# Patient Record
Sex: Female | Born: 1960 | Race: White | Hispanic: No | Smoking: Former smoker
Health system: Southern US, Community
[De-identification: ages and names within clinical notes are randomized; demographics above are authoritative.]

## PROBLEM LIST (undated history)

## (undated) DIAGNOSIS — G4730 Sleep apnea, unspecified: Secondary | ICD-10-CM

## (undated) DIAGNOSIS — H539 Unspecified visual disturbance: Secondary | ICD-10-CM

## (undated) DIAGNOSIS — G8191 Hemiplegia, unspecified affecting right dominant side: Secondary | ICD-10-CM

## (undated) DIAGNOSIS — R9430 Abnormal result of cardiovascular function study, unspecified: Secondary | ICD-10-CM

## (undated) DIAGNOSIS — R413 Other amnesia: Secondary | ICD-10-CM

## (undated) DIAGNOSIS — I639 Cerebral infarction, unspecified: Secondary | ICD-10-CM

## (undated) DIAGNOSIS — R5383 Other fatigue: Secondary | ICD-10-CM

## (undated) DIAGNOSIS — T8859XA Other complications of anesthesia, initial encounter: Secondary | ICD-10-CM

## (undated) DIAGNOSIS — Q211 Atrial septal defect: Secondary | ICD-10-CM

## (undated) DIAGNOSIS — K219 Gastro-esophageal reflux disease without esophagitis: Secondary | ICD-10-CM

## (undated) DIAGNOSIS — I10 Essential (primary) hypertension: Secondary | ICD-10-CM

## (undated) DIAGNOSIS — K5792 Diverticulitis of intestine, part unspecified, without perforation or abscess without bleeding: Secondary | ICD-10-CM

## (undated) DIAGNOSIS — Z888 Allergy status to other drugs, medicaments and biological substances status: Secondary | ICD-10-CM

## (undated) DIAGNOSIS — N643 Galactorrhea not associated with childbirth: Secondary | ICD-10-CM

## (undated) DIAGNOSIS — IMO0002 Reserved for concepts with insufficient information to code with codable children: Secondary | ICD-10-CM

## (undated) DIAGNOSIS — R7303 Prediabetes: Secondary | ICD-10-CM

## (undated) DIAGNOSIS — Z720 Tobacco use: Secondary | ICD-10-CM

## (undated) DIAGNOSIS — Q2112 Patent foramen ovale: Secondary | ICD-10-CM

## (undated) DIAGNOSIS — E079 Disorder of thyroid, unspecified: Secondary | ICD-10-CM

## (undated) DIAGNOSIS — Z87442 Personal history of urinary calculi: Secondary | ICD-10-CM

## (undated) DIAGNOSIS — N209 Urinary calculus, unspecified: Secondary | ICD-10-CM

## (undated) DIAGNOSIS — N200 Calculus of kidney: Secondary | ICD-10-CM

## (undated) DIAGNOSIS — G43909 Migraine, unspecified, not intractable, without status migrainosus: Secondary | ICD-10-CM

## (undated) DIAGNOSIS — R42 Dizziness and giddiness: Secondary | ICD-10-CM

## (undated) DIAGNOSIS — E063 Autoimmune thyroiditis: Secondary | ICD-10-CM

## (undated) HISTORY — DX: Abnormal result of cardiovascular function study, unspecified: R94.30

## (undated) HISTORY — DX: Patent foramen ovale: Q21.12

## (undated) HISTORY — DX: Other fatigue: R53.83

## (undated) HISTORY — DX: Essential (primary) hypertension: I10

## (undated) HISTORY — PX: TONSILLECTOMY: SUR1361

## (undated) HISTORY — DX: Cerebral infarction, unspecified: I63.9

## (undated) HISTORY — DX: Migraine, unspecified, not intractable, without status migrainosus: G43.909

## (undated) HISTORY — DX: Gastro-esophageal reflux disease without esophagitis: K21.9

## (undated) HISTORY — DX: Tobacco use: Z72.0

## (undated) HISTORY — DX: Dizziness and giddiness: R42

## (undated) HISTORY — DX: Diverticulitis of intestine, part unspecified, without perforation or abscess without bleeding: K57.92

## (undated) HISTORY — DX: Hemiplegia, unspecified affecting right dominant side: G81.91

## (undated) HISTORY — DX: Urinary calculus, unspecified: N20.9

## (undated) HISTORY — PX: OTHER SURGICAL HISTORY: SHX169

## (undated) HISTORY — DX: Autoimmune thyroiditis: E06.3

## (undated) HISTORY — DX: Reserved for concepts with insufficient information to code with codable children: IMO0002

## (undated) HISTORY — DX: Galactorrhea not associated with childbirth: N64.3

## (undated) HISTORY — DX: Other amnesia: R41.3

## (undated) HISTORY — DX: Allergy status to other drugs, medicaments and biological substances: Z88.8

## (undated) HISTORY — DX: Atrial septal defect: Q21.1

## (undated) HISTORY — PX: TONSILLECTOMY AND ADENOIDECTOMY: SHX28

## (undated) HISTORY — PX: BACK SURGERY: SHX140

## (undated) HISTORY — DX: Disorder of thyroid, unspecified: E07.9

## (undated) HISTORY — PX: PATENT FORAMEN OVALE(PFO) CLOSURE: CATH118300

## (undated) HISTORY — DX: Unspecified visual disturbance: H53.9

---

## 1995-03-07 HISTORY — PX: TOTAL ABDOMINAL HYSTERECTOMY: SHX209

## 2004-08-10 ENCOUNTER — Ambulatory Visit: Payer: Self-pay | Admitting: "Endocrinology

## 2004-12-12 ENCOUNTER — Ambulatory Visit: Payer: Self-pay | Admitting: "Endocrinology

## 2005-05-09 ENCOUNTER — Ambulatory Visit: Payer: Self-pay | Admitting: "Endocrinology

## 2005-08-23 DIAGNOSIS — S20229A Contusion of unspecified back wall of thorax, initial encounter: Secondary | ICD-10-CM | POA: Insufficient documentation

## 2005-08-23 DIAGNOSIS — M1990 Unspecified osteoarthritis, unspecified site: Secondary | ICD-10-CM | POA: Insufficient documentation

## 2005-08-23 DIAGNOSIS — M51379 Other intervertebral disc degeneration, lumbosacral region without mention of lumbar back pain or lower extremity pain: Secondary | ICD-10-CM | POA: Insufficient documentation

## 2005-10-18 ENCOUNTER — Ambulatory Visit: Payer: Self-pay | Admitting: "Endocrinology

## 2005-12-06 ENCOUNTER — Ambulatory Visit (HOSPITAL_COMMUNITY): Admission: RE | Admit: 2005-12-06 | Discharge: 2005-12-07 | Payer: Self-pay | Admitting: Orthopaedic Surgery

## 2006-05-01 ENCOUNTER — Encounter: Admission: RE | Admit: 2006-05-01 | Discharge: 2006-05-01 | Payer: Self-pay | Admitting: "Endocrinology

## 2006-05-01 ENCOUNTER — Ambulatory Visit: Payer: Self-pay | Admitting: "Endocrinology

## 2006-05-10 ENCOUNTER — Ambulatory Visit: Payer: Self-pay | Admitting: "Endocrinology

## 2006-05-23 ENCOUNTER — Encounter: Admission: RE | Admit: 2006-05-23 | Discharge: 2006-05-23 | Payer: Self-pay | Admitting: "Endocrinology

## 2006-05-24 ENCOUNTER — Ambulatory Visit: Payer: Self-pay | Admitting: "Endocrinology

## 2006-05-25 ENCOUNTER — Encounter: Admission: RE | Admit: 2006-05-25 | Discharge: 2006-05-25 | Payer: Self-pay | Admitting: "Endocrinology

## 2006-05-25 HISTORY — PX: OTHER SURGICAL HISTORY: SHX169

## 2006-09-27 ENCOUNTER — Encounter: Admission: RE | Admit: 2006-09-27 | Discharge: 2006-09-27 | Payer: Self-pay | Admitting: "Endocrinology

## 2006-10-02 ENCOUNTER — Ambulatory Visit: Payer: Self-pay | Admitting: "Endocrinology

## 2007-02-04 ENCOUNTER — Ambulatory Visit: Payer: Self-pay | Admitting: "Endocrinology

## 2007-03-14 ENCOUNTER — Encounter: Admission: RE | Admit: 2007-03-14 | Discharge: 2007-03-14 | Payer: Self-pay | Admitting: Orthopaedic Surgery

## 2007-04-08 ENCOUNTER — Inpatient Hospital Stay (HOSPITAL_COMMUNITY): Admission: RE | Admit: 2007-04-08 | Discharge: 2007-04-12 | Payer: Self-pay | Admitting: Orthopaedic Surgery

## 2007-06-10 ENCOUNTER — Ambulatory Visit: Payer: Self-pay | Admitting: "Endocrinology

## 2007-09-24 ENCOUNTER — Encounter: Payer: Self-pay | Admitting: Endocrinology

## 2008-02-12 ENCOUNTER — Ambulatory Visit: Payer: Self-pay | Admitting: "Endocrinology

## 2008-02-19 ENCOUNTER — Encounter: Admission: RE | Admit: 2008-02-19 | Discharge: 2008-02-19 | Payer: Self-pay | Admitting: "Endocrinology

## 2008-02-19 ENCOUNTER — Encounter: Payer: Self-pay | Admitting: Endocrinology

## 2008-02-23 IMAGING — RF DG DISKOGRAPHY LUMBAR S+I
12 series · 12 of 12 positions shown · IV contrast (omnipaque)
Comparison: none

CLINICAL DATA: Back pain 
LUMBAR DISKOGRAM: 
The patient was given extensive informed consent including the risk of pain, infection, spinal fluid leak, and neurologic deficit. Specifically, the risk of infection including diskitis and osteomyelitis was discussed with the patient, who agreed to proceed. 
The patient was given 1 gram Ancef IV thirty minutes prior to the procedure. 1 cc of Ancef was added to 20 cc of Omnipaque 180 used for injection. The back was prepared with a sterile scrub sponge for five minutes followed by copious application of Betadine solution. Sterile drapes were applied and strict sterile technique was used by everyone in the room. A left paraspinous approach was taken using 22 gauge Igmary Maxi. Individual disk spaces were examined as follows:
TECHNIQUE: Multidetector CT imaging of the lumbar spine was performed after intradiskal injection of contrast. Multiplanar CT image reconstructions were also generated.

[Series 1: discogram · 1 of 1 slices shown (1 of 10)]
[im 1/1]
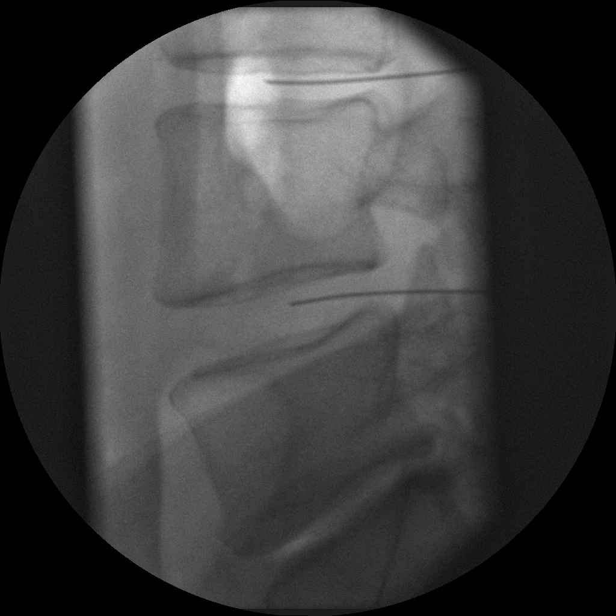

[Series 2: discogram · 1 of 1 slices shown (2 of 10)]
[im 1/1]
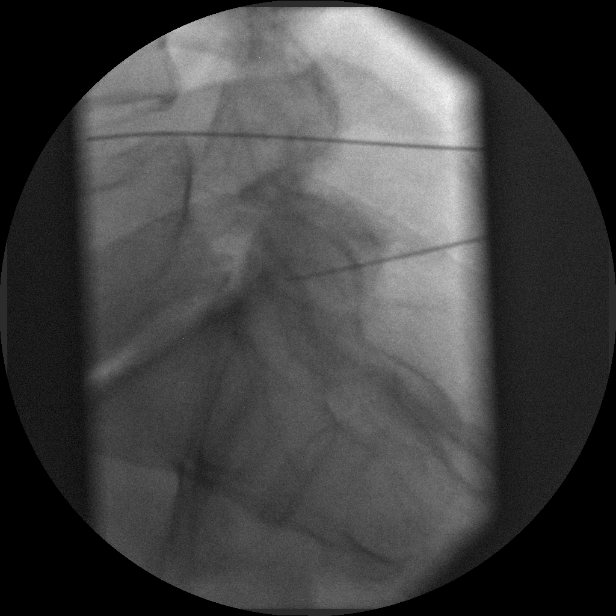

[Series 3: discogram · 1 of 1 slices shown (3 of 10)]
[im 1/1]
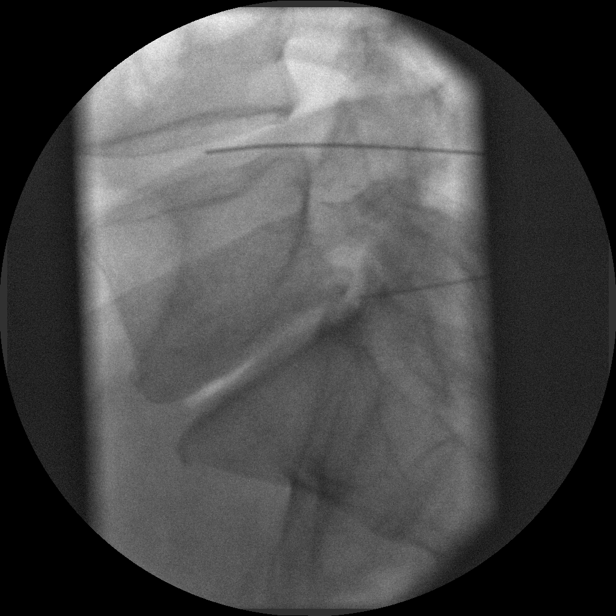

[Series 4: discogram · 1 of 1 slices shown (4 of 10)]
[im 1/1]
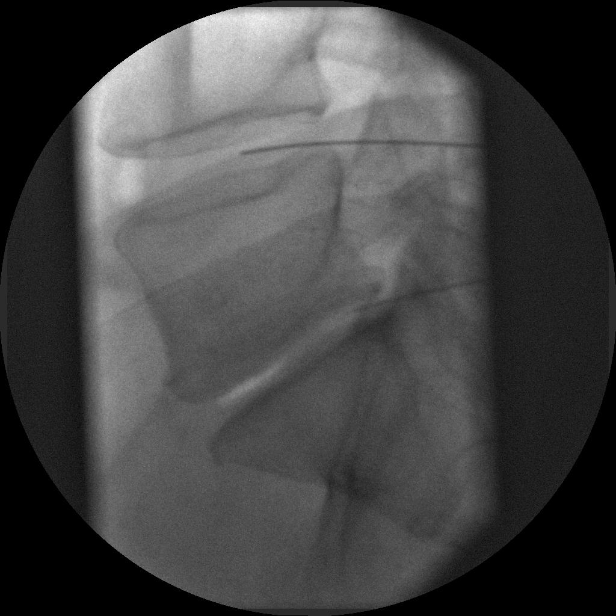

[Series 5: discogram · 1 of 1 slices shown (5 of 10)]
[im 1/1]
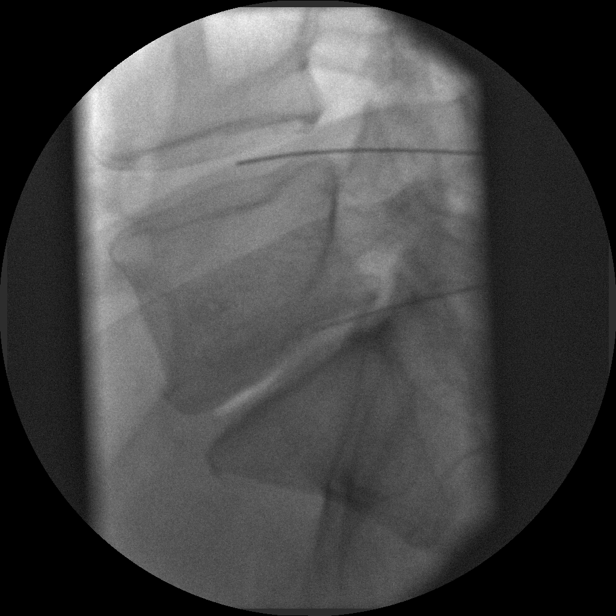

[Series 6: discogram · 1 of 1 slices shown (6 of 10)]
[im 1/1]
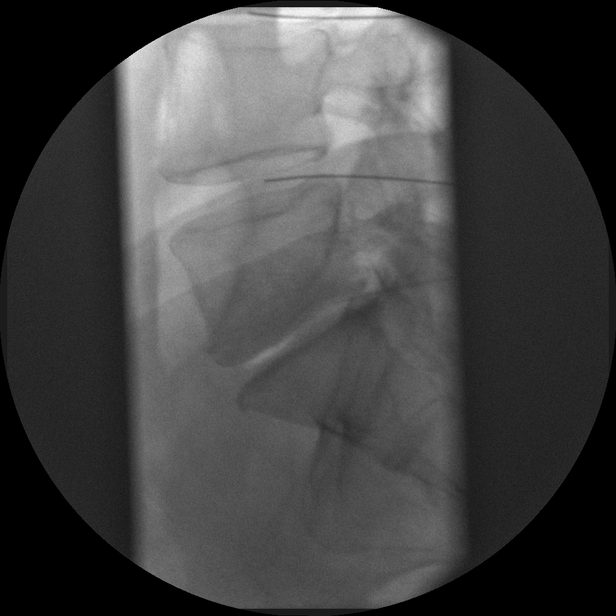

[Series 7: (hospital) · 1 of 1 slices shown (1 of 2)]
[im 1/1]
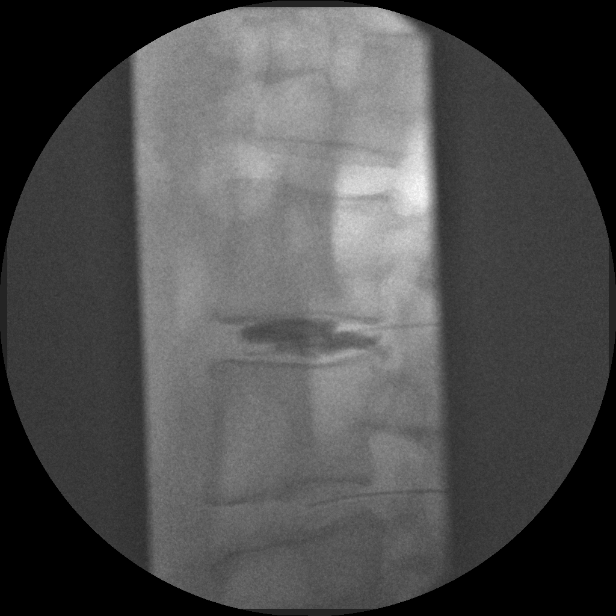

[Series 8: discogram · 1 of 1 slices shown (7 of 10)]
[im 1/1]
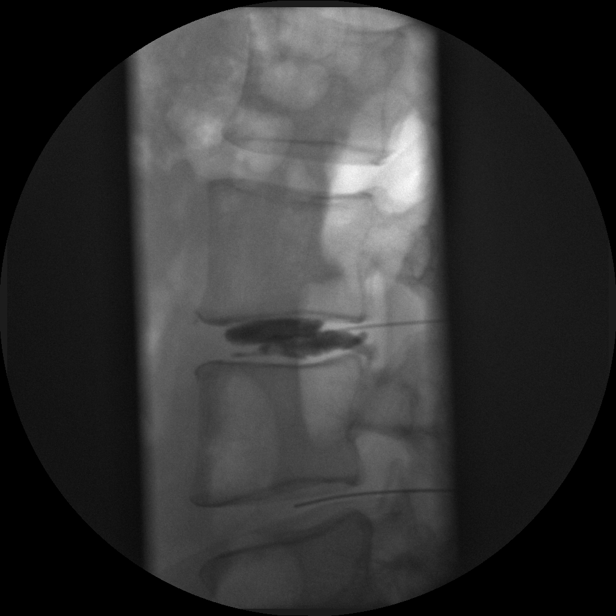

[Series 9: (hospital) · 1 of 1 slices shown (2 of 2)]
[im 1/1]
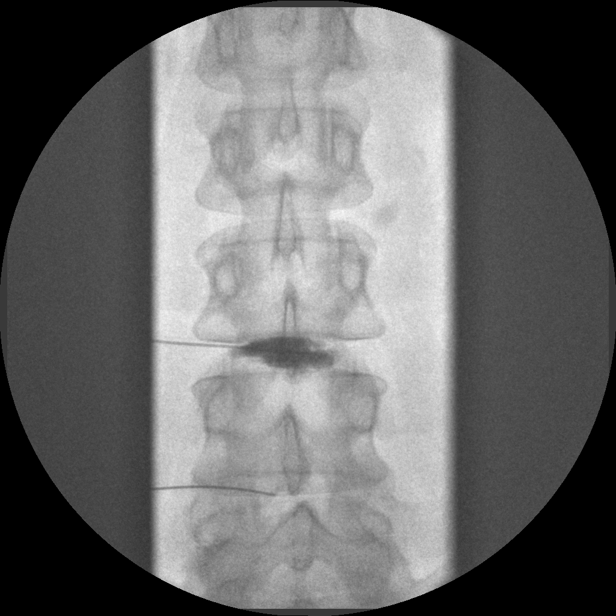

[Series 10: discogram · 1 of 1 slices shown (8 of 10)]
[im 1/1]
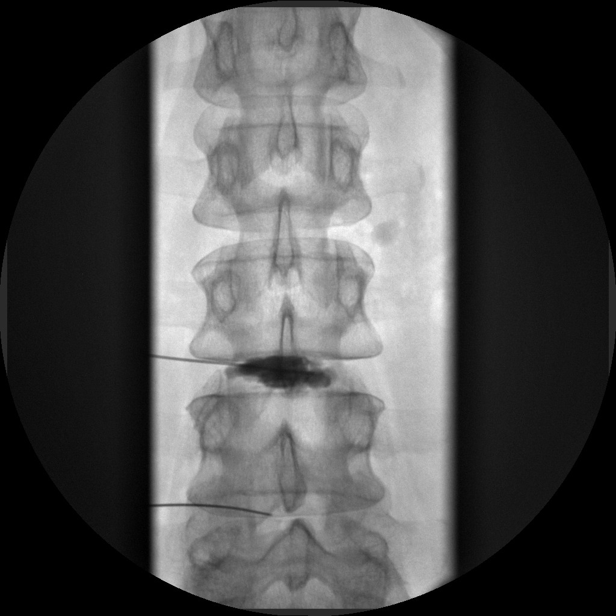

[Series 11: discogram · 1 of 1 slices shown (9 of 10)]
[im 1/1]
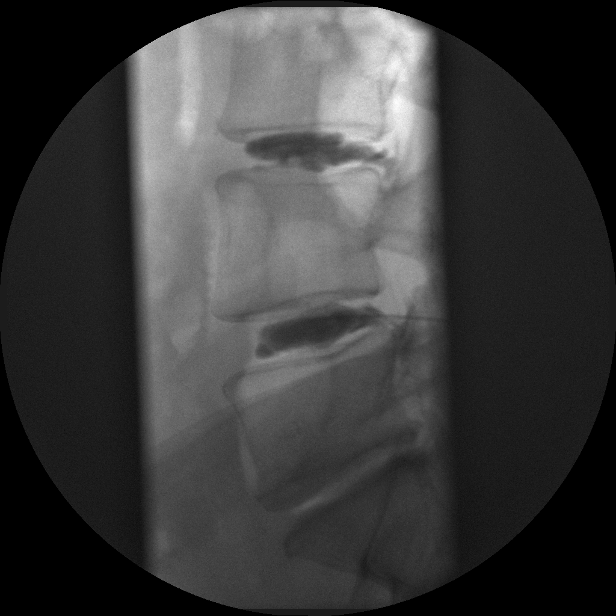

[Series 12: discogram · 1 of 1 slices shown (10 of 10)]
[im 1/1]
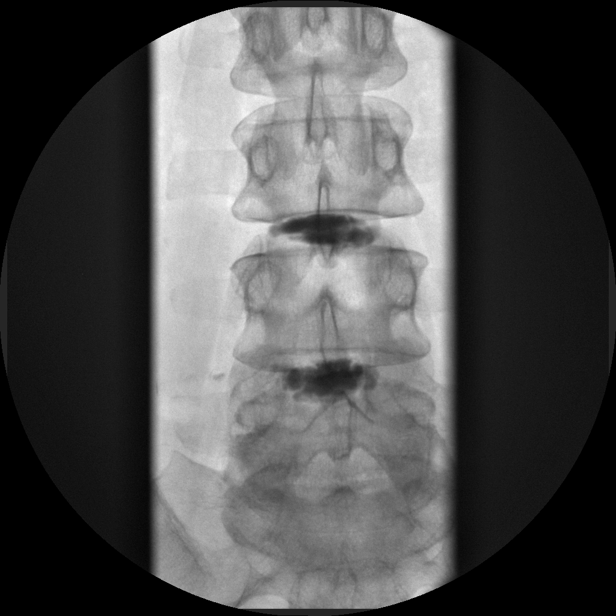

[12 of 12 positions shown; findings below may reference images not displayed]

FINDINGS: L3-4: Opening pressure was 5 PSI with no concordant pain at this level. A pressure endpoint of 20 PSI was reached, after 3 cc of contrast was instilled. 
L4-5: Opening pressure was 5 PSI. Pressure and pain was elicited at a pressure of 25 PSI with a total volume of 2.5 ml infused. The pain was concordant, with pressure and pain right greater than left in the leg and buttock area. .  Pain at this level radiated to the buttocks and lower back.
L5-S1: This disk space was approached through a left paramedian transdural approach due to severe degeneration of the L5-S1 disk. Opening pressure was 50 PSI, and there was no disk space to speak of . The needle placement elicited an exact match to the patient?s low back pain. Infusion of 1.5 ml of contrast freely flowed out of the disk into the epidural space posteriorly, consistent with diffuse annular tearing and completely incompetent posterior annular fibers.
IMPRESSION: 1. Concordant pain at L5-S1 disk with concentric annular tearing, with posterior epidural extravasation of contrast. 
2. Concordant pain at L4-5 disk with concentric annular tearing, with posterior epidural extravasation of contrast..
3. No concordant pain at L3-L4, with posterior annular tear.
POST DISKOGRAM CT SCAN OF THE LUMBAR SPINE:
FINDINGS: L3-4: Right posterior paracentral annular rent with epidural extravasation of contrast. No significant bony foraminal stenosis. There appears to have been a right minilaminotomy at this level. 
L4-5: Large posterior annular rent, with right hemilaminotomy. Free spill of contrast through this right paracentral annular tear into the epidural space. No significant bony foraminal stenosis at L4-5. 
L5-S1: Diffusely degenerated disk, with vacuum disk phenomena, diffuse annular tearing, and posterior epidural extravasation. Large posteroinferior endplate osteophytes contribute to biforaminal narrowing. The facets are moderately degenerated with moderate spurring, also contributing to the foraminal stenosis.
IMPRESSION: 1. Right paracentral posterior annular tears at L3-4 and L4-5.
2. Diffuse concentric annular tearing and severe disk degeneration, with extravasation of contrast posteriorly.
3. Biforaminal narrowing at L5-S1 due to a combination of disk, osteophytes and facet spurring.

## 2008-03-19 IMAGING — RF DG LUMBAR SPINE 2-3V
1 series · 4 of 4 positions shown · non-contrast
Comparison: CT of the lumbar spine of 03/14/07.

CLINICAL DATA: Lumbar disc disease and posterior fusion.
 LUMBAR SPINE ? 2 VIEW:

[Series 1: run · 4 of 4 slices shown]
[im 1/4]
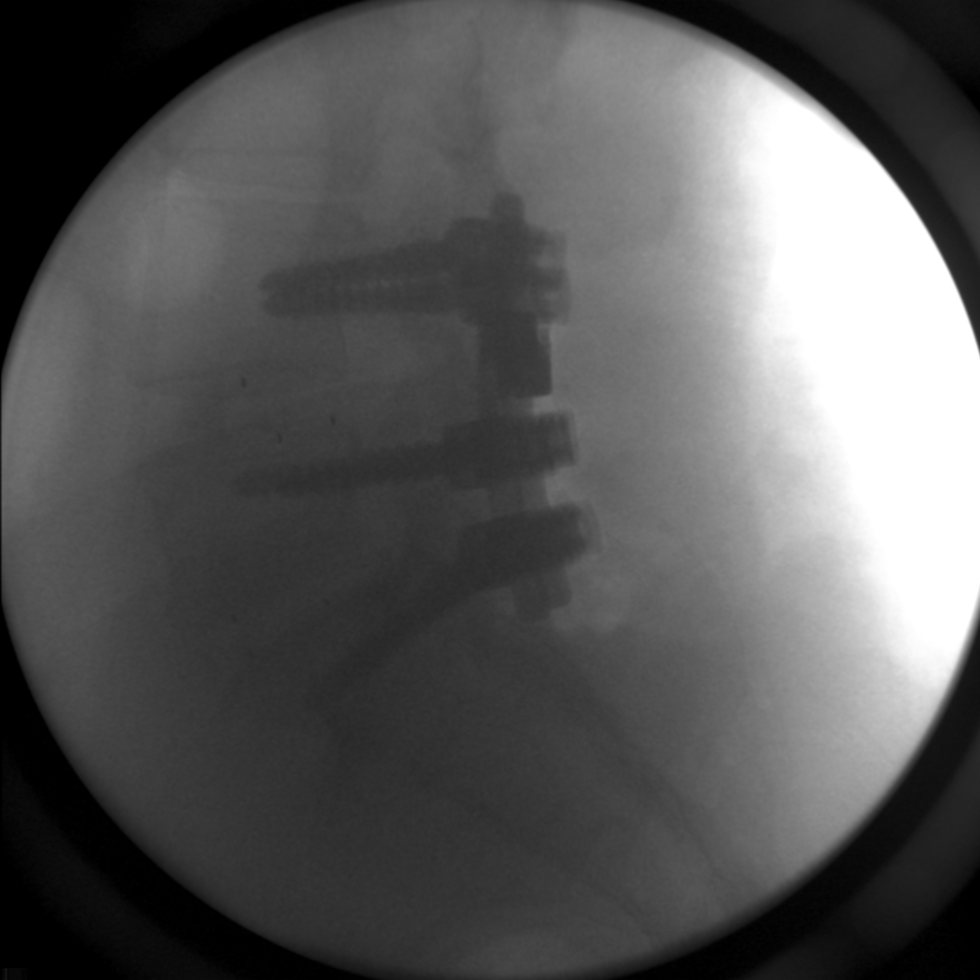
[im 2/4]
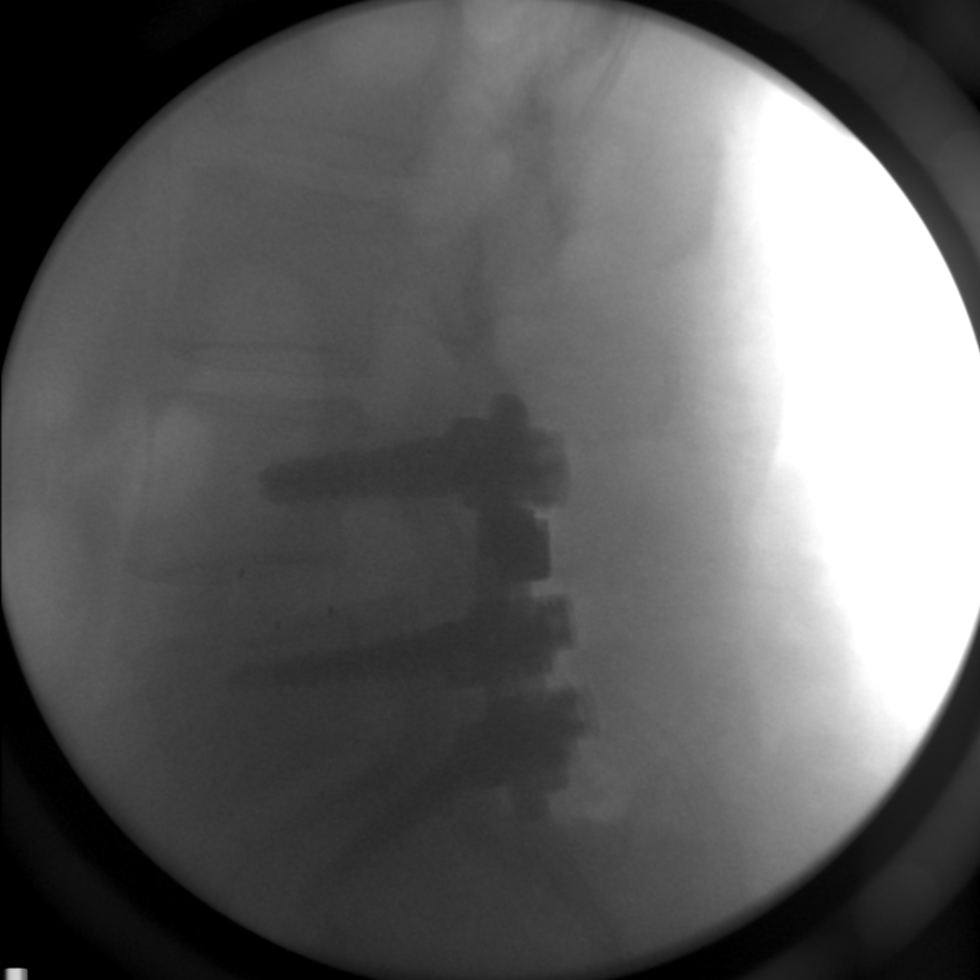
[im 3/4]
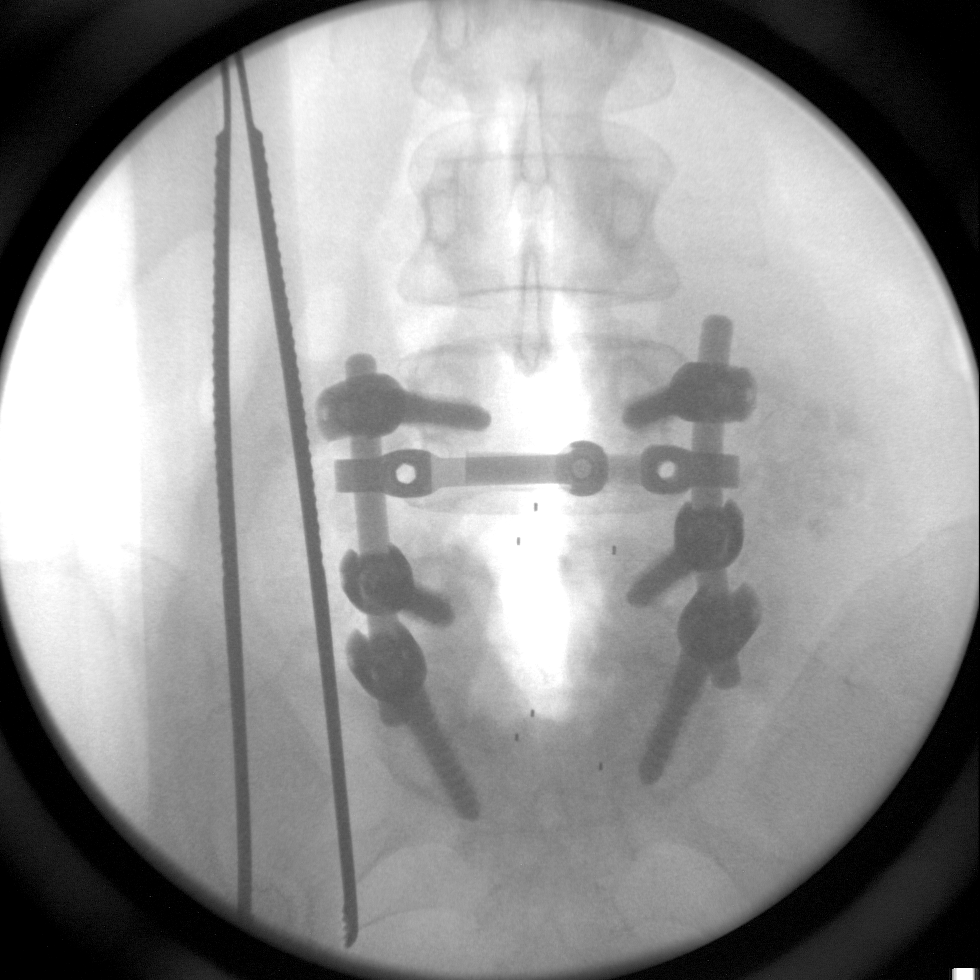
[im 4/4]
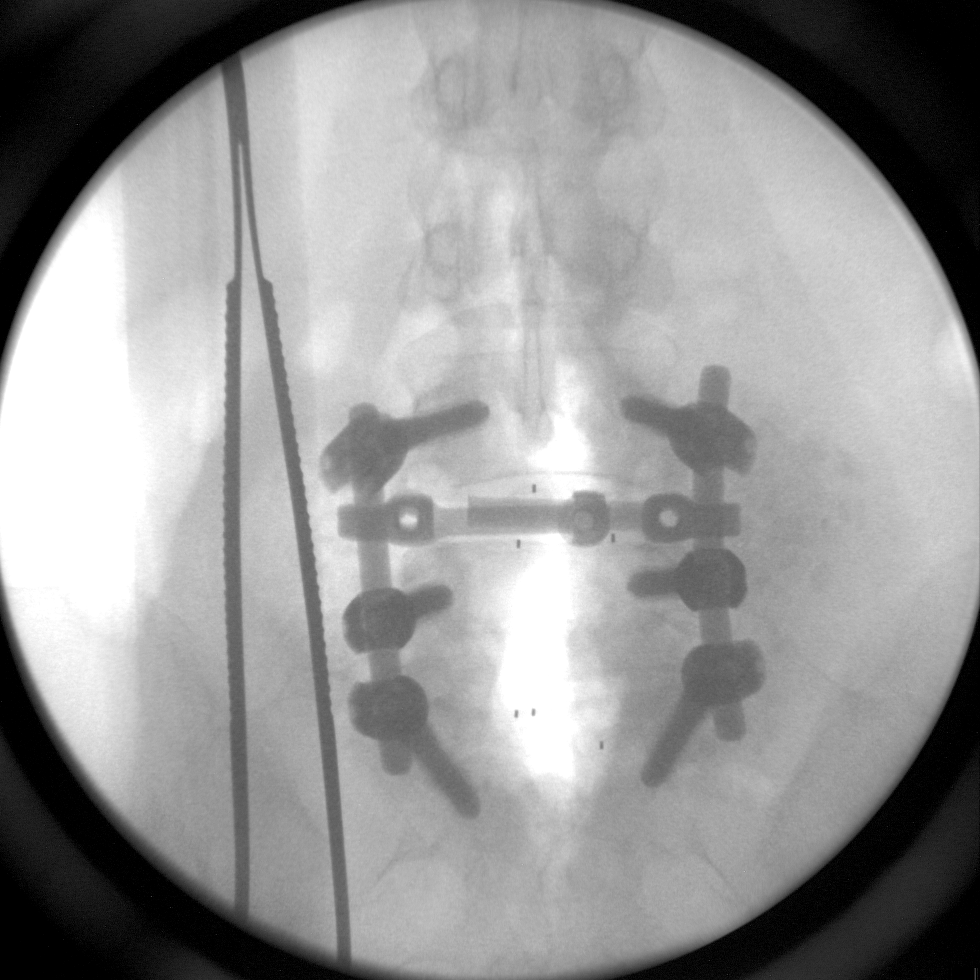

[4 of 4 positions shown; findings below may reference images not displayed]

FINDINGS: Frontal and lateral projections were obtained intraoperatively.  This showed the patient to be status post posterior lumbar fusion extending from L4 to S1 with appropriate positioning and appearance of hardware including transpedicular screws at all three levels and intervening Allograft at the L4-5 and L5-S1 disc space levels.  Laminectomy defects are also visualized.  Alignment appears near anatomic.
IMPRESSION: Appropriate appearance and alignment status post posterior lumbar fusion and decompressive surgery spanning from L4 to S1.

## 2008-04-15 ENCOUNTER — Encounter: Admission: RE | Admit: 2008-04-15 | Discharge: 2008-04-15 | Payer: Self-pay | Admitting: General Practice

## 2008-04-17 ENCOUNTER — Encounter: Admission: RE | Admit: 2008-04-17 | Discharge: 2008-04-17 | Payer: Self-pay | Admitting: General Practice

## 2008-04-23 ENCOUNTER — Ambulatory Visit: Payer: Self-pay | Admitting: Endocrinology

## 2008-04-24 ENCOUNTER — Telehealth (INDEPENDENT_AMBULATORY_CARE_PROVIDER_SITE_OTHER): Payer: Self-pay | Admitting: *Deleted

## 2008-05-13 ENCOUNTER — Ambulatory Visit: Payer: Self-pay

## 2008-05-13 ENCOUNTER — Encounter (INDEPENDENT_AMBULATORY_CARE_PROVIDER_SITE_OTHER): Payer: Self-pay | Admitting: Neurology

## 2008-06-15 ENCOUNTER — Encounter: Payer: Self-pay | Admitting: Cardiology

## 2008-06-18 ENCOUNTER — Ambulatory Visit: Payer: Self-pay | Admitting: Cardiology

## 2008-06-19 ENCOUNTER — Encounter: Payer: Self-pay | Admitting: Cardiology

## 2008-07-01 ENCOUNTER — Encounter: Payer: Self-pay | Admitting: Cardiology

## 2008-07-03 ENCOUNTER — Ambulatory Visit: Payer: Self-pay | Admitting: "Endocrinology

## 2008-07-06 ENCOUNTER — Ambulatory Visit (HOSPITAL_COMMUNITY): Admission: RE | Admit: 2008-07-06 | Discharge: 2008-07-06 | Payer: Self-pay | Admitting: Neurology

## 2008-07-06 ENCOUNTER — Ambulatory Visit: Payer: Self-pay | Admitting: "Endocrinology

## 2008-07-06 ENCOUNTER — Encounter: Payer: Self-pay | Admitting: Cardiology

## 2008-07-22 ENCOUNTER — Telehealth (INDEPENDENT_AMBULATORY_CARE_PROVIDER_SITE_OTHER): Payer: Self-pay | Admitting: *Deleted

## 2008-08-07 ENCOUNTER — Ambulatory Visit: Payer: Self-pay | Admitting: Cardiology

## 2008-08-07 ENCOUNTER — Encounter: Payer: Self-pay | Admitting: Cardiology

## 2008-08-25 ENCOUNTER — Telehealth: Payer: Self-pay | Admitting: Cardiology

## 2008-10-06 ENCOUNTER — Encounter: Payer: Self-pay | Admitting: Cardiology

## 2008-10-22 ENCOUNTER — Ambulatory Visit: Payer: Self-pay | Admitting: "Endocrinology

## 2008-10-29 ENCOUNTER — Ambulatory Visit: Payer: Self-pay | Admitting: Cardiology

## 2008-10-30 ENCOUNTER — Encounter: Admission: RE | Admit: 2008-10-30 | Discharge: 2008-10-30 | Payer: Self-pay | Admitting: Orthopaedic Surgery

## 2008-11-05 ENCOUNTER — Encounter: Payer: Self-pay | Admitting: Cardiology

## 2008-11-26 ENCOUNTER — Encounter: Payer: Self-pay | Admitting: Cardiology

## 2008-11-27 ENCOUNTER — Encounter: Payer: Self-pay | Admitting: Cardiology

## 2008-11-28 ENCOUNTER — Encounter: Payer: Self-pay | Admitting: Cardiology

## 2009-01-30 IMAGING — US US SOFT TISSUE HEAD/NECK
1 series · 13 of 13 positions shown · non-contrast
Comparison: 09/27/2006

CLINICAL DATA: Follow up multinodular thyroid gland/Graves' disease

THYROID ULTRASOUND
TECHNIQUE: Ultrasound examination of the thyroid gland and
adjacent soft tissues was performed.

[Series 1: us soft tissue head/neck · 0.06mm/px · 13 of 13 slices shown]
[im 1/13]
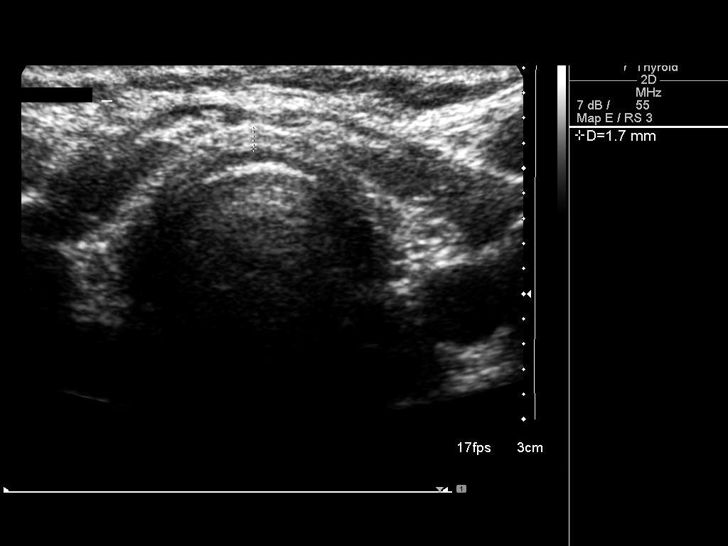
[im 2/13]
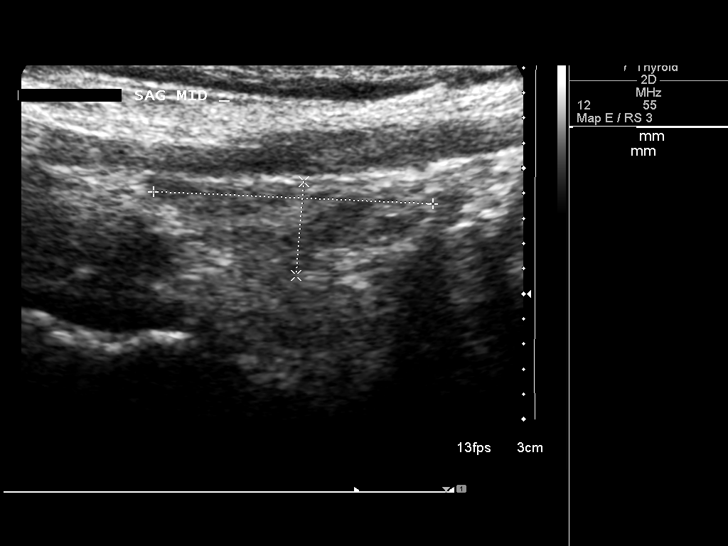
[im 3/13]
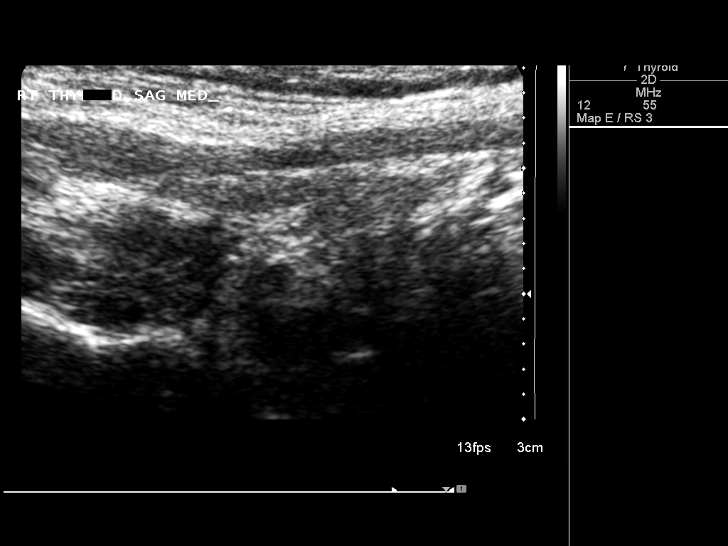
[im 4/13]
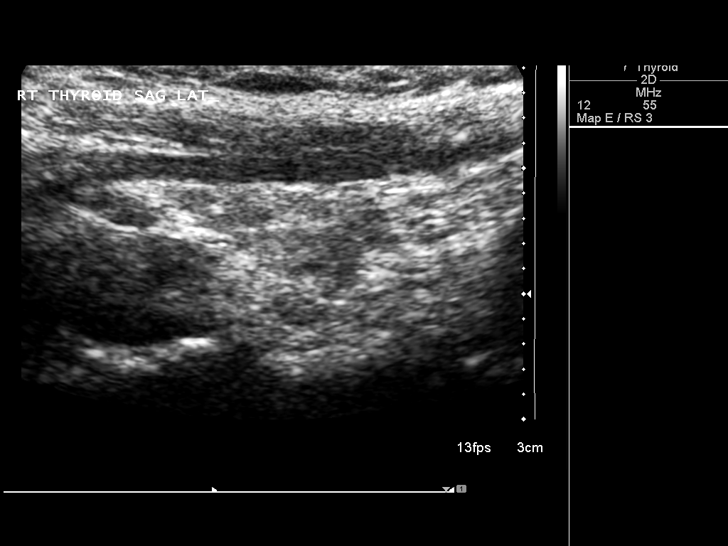
[im 5/13]
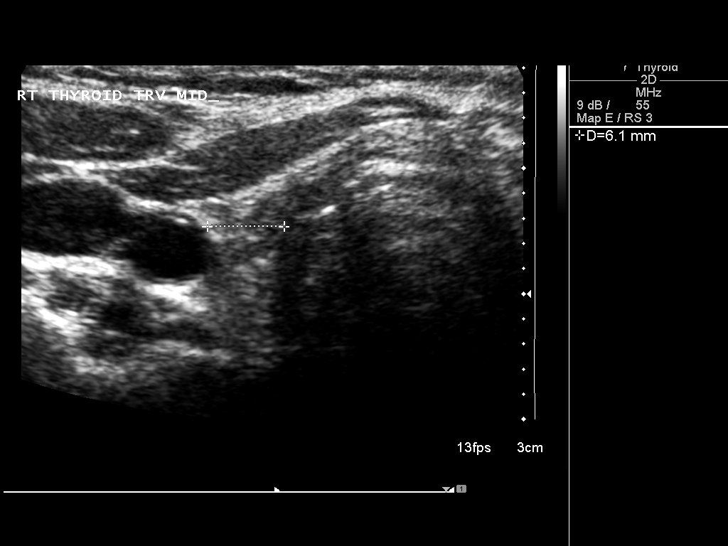
[im 6/13]
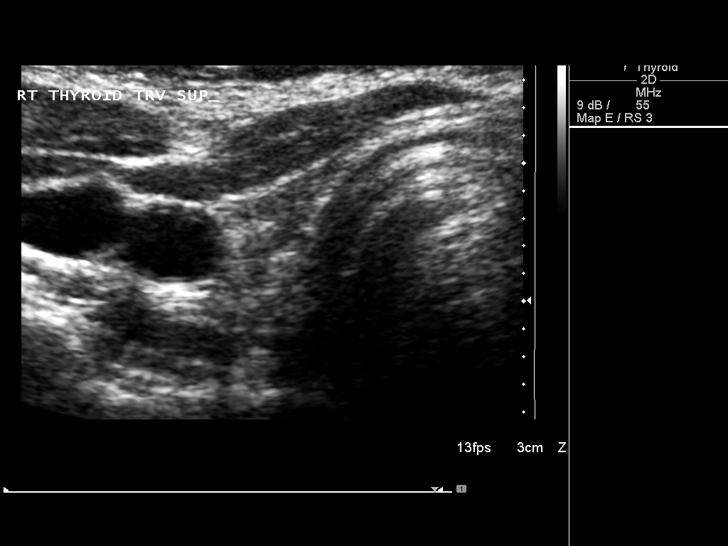
[im 7/13]
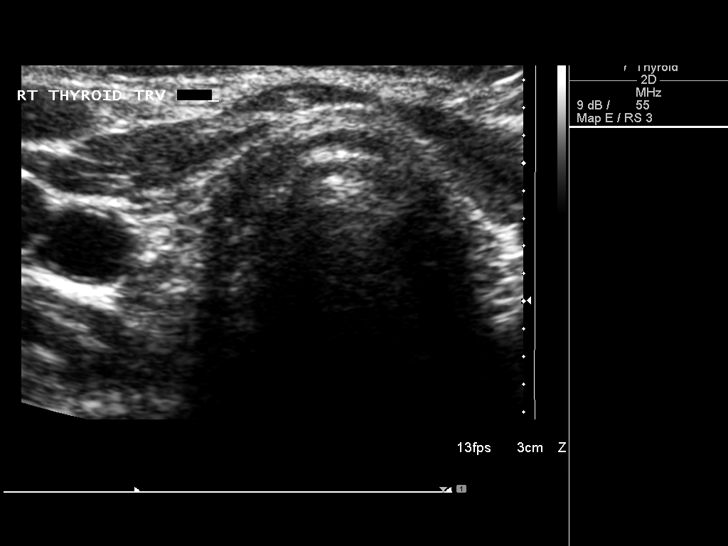
[im 8/13]
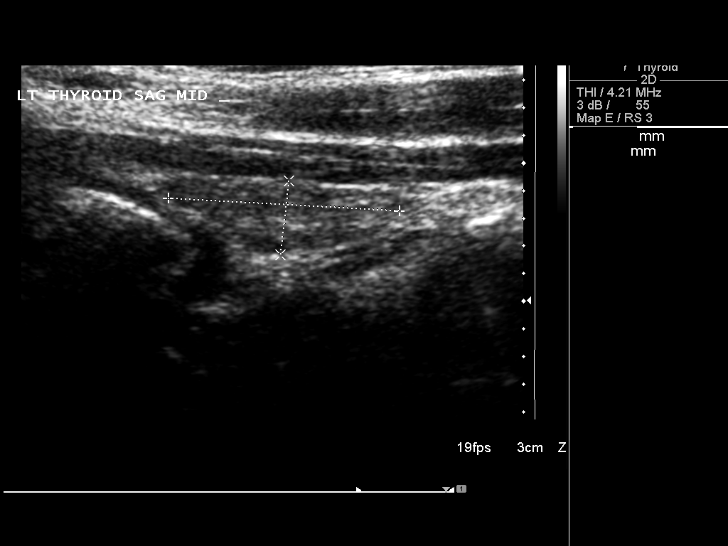
[im 9/13]
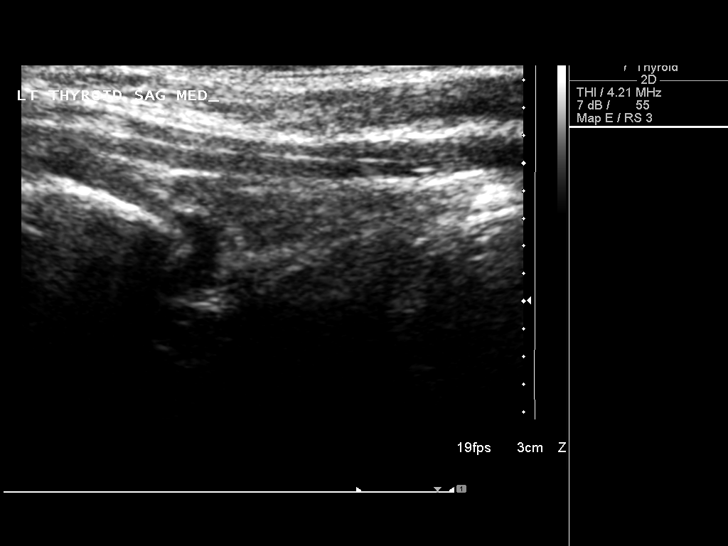
[im 10/13]
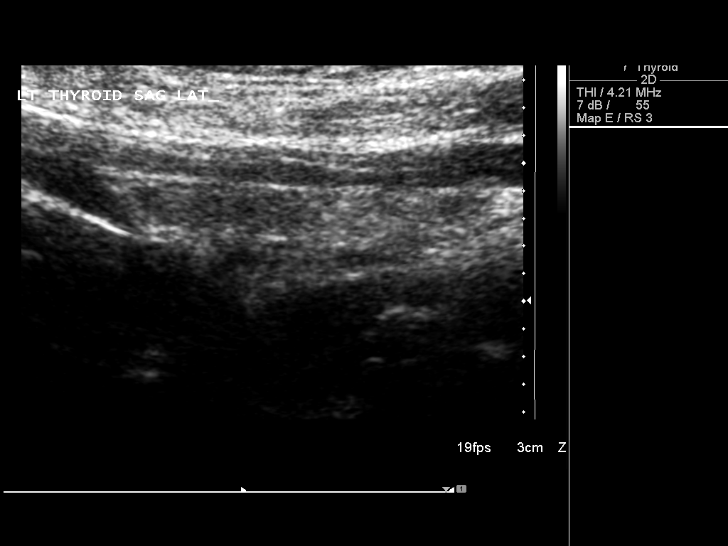
[im 11/13]
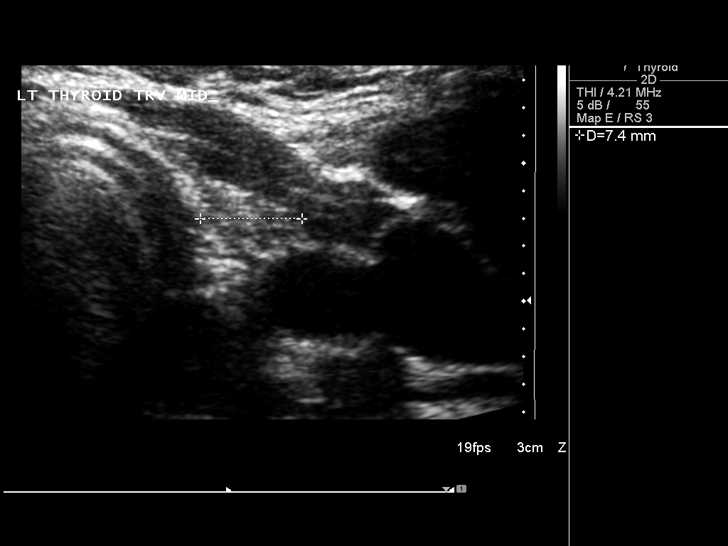
[im 12/13]
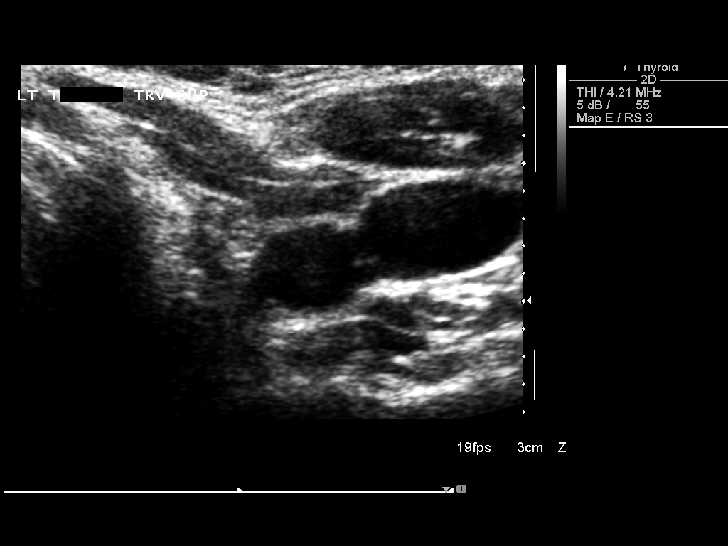
[im 13/13]
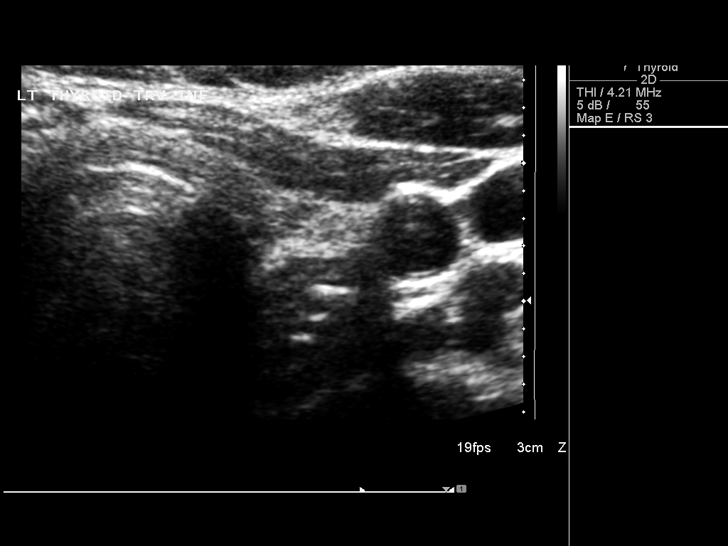

[13 of 13 positions shown; findings below may reference images not displayed]

FINDINGS: The right lobe of the thyroid gland measures 2.2 x 0.7 x
0.6 cm. Previously this measured 3.3 x 1.2 x 0.9 cm.

The left lobe measures 1.7 x 0.5 x 0.7 cm. Previously this measured
3.3 x 0.9 x 0.9 cm.

 The isthmus measures 1.7 mm. Previously this measured the same.

The thyroid echotexture is inhomogeneous.

No new nodules identified.
IMPRESSION: Interval decrease in size of thyroid gland status post radioactive
iodine therapy.

## 2009-04-13 ENCOUNTER — Ambulatory Visit: Payer: Self-pay | Admitting: "Endocrinology

## 2009-08-06 ENCOUNTER — Ambulatory Visit: Payer: Self-pay | Admitting: Unknown Physician Specialty

## 2009-08-12 ENCOUNTER — Ambulatory Visit: Payer: Self-pay | Admitting: "Endocrinology

## 2009-10-07 ENCOUNTER — Ambulatory Visit: Payer: Self-pay | Admitting: "Endocrinology

## 2009-10-15 ENCOUNTER — Encounter: Admission: RE | Admit: 2009-10-15 | Discharge: 2009-10-15 | Payer: Self-pay | Admitting: Nurse Practitioner

## 2010-03-31 ENCOUNTER — Ambulatory Visit
Admission: RE | Admit: 2010-03-31 | Discharge: 2010-03-31 | Payer: Self-pay | Source: Home / Self Care | Attending: "Endocrinology | Admitting: "Endocrinology

## 2010-07-19 NOTE — Assessment & Plan Note (Signed)
[facility]                          [facility] NOTE   NAME:[name], [name] RIESEN                   MRN:          [id number]  DATE:08/07/2008                            DOB:          02-21-1961    HISTORY OF PRESENT ILLNESS:  The patient is a 50 year old female  followed by Dr. Anne Hahn and recently seen by Dr. Pearlean Brownie for transcranial  Doppler study.  The patient was evaluated for PFO.  Her transcranial  Doppler study was positive.  She also has a previous history of right  brain stroke (temporoparietal) as well as headaches with visual  disturbance and numbness episodes.  She has a history of migraine  headaches as well as episodes of numbness and is being treated for  migraines versus TIA.  She has a prior history of some confusion.  At  the time, she also reports bumping into objects on the left side.  She  had a recent MRI of the brain done which showed remote right  temporoparietal infarct.  The MRA showed no significant stenosis of the  large vessels.  The patient was then referred for an echocardiographic  study which mentioned no evidence of shunt and normal LV function.  Transcranial Doppler study, however, done on June 16, 2006, showed  there was strongly positive indicative of large right to left  intracranial shunt.  The patient's discussion with Dr. Anne Hahn had  declined Coumadin therapy, so I am going to take aspirin therapy at this  point in time.  There also has been some discussion regarding PFO  closure, although at this point it remains somewhat of a controversial  approach.  The patient's symptoms, however, consistent with shortness of  breath.  Please see my cardiology consultation dictation note regarding  her symptomatology.  The patient, per request of Dr. Anne Hahn, when was  scheduled for a TEE, to evaluate her for possible PFO.  However, the TEE  was unable to be completed as the patient had a very narrow ostium of  the  proximal esophagus with inability to cannulate the esophagus with a  TEE probe.  We even did this under anesthesia guidance with direct  visualization with a laryngoscope that the probe could not be pushed  forward.  The patient actually told this before the procedure that  during an elective surgery they even have extreme trouble getting in an  NG tube in her esophagus.  I then scheduled the patient for a barium  swallow, which reportedly was within normal limits.  I was concerned  that there was an esophageal web that this was the workup and it was  negative for this diagnosis.  I referred then the patient to [facility] to do the TEE with a pediatric probe.  Unfortunately, no  pediatric probe was available.  The patient was then referred back to me  and we did here a transthoracic echocardiogram with saline contrast  which was positive and confirmed the findings of a right-to-left shunt.  In the interim, the patient did not have a cardiac catheterization yet.  She then presented to the office on  August 07, 2008, when she complained of  ongoing dyspnea, but also of palpitations.  At times, she feels her  heart jumping out of her chest.  There may be associates panic  attacks, although the primum movens is unclear.  The patient continues  to refuse the Coumadin in light of her PFO and even the possibility of  atrial fibrillation.  Because of her palpitation, we will apply a  CardioNet monitor.  I also placed a call to [facility] to Dr. Regino Schultze.  He told me that we will refer the patient to Dr.  Bernette Redbird for consideration of PFO closure, if indeed this is  confirmed by a cardiac catheterization and there is a significant right-  to-left shunt.   MEDICATIONS:  1. Synthroid 100 mcg p.o. daily.  2. Bystolic 10 mg p.o. daily.  3. Aspirin 81 mg p.o. daily.   PHYSICAL EXAMINATION:  VITAL SIGNS:  Blood pressure 148/90, heart rate  66, weight 163 pounds.  NECK:   Normal carotid upstroke, no carotid bruits.  LUNGS:  Clear breath sounds bilaterally.  HEART:  Regular rate and rhythm.  Normal S1 and S2.  No murmur, rubs, or  gallops.  ABDOMEN:  Soft, nontender.  No rebound or guarding.  Good bowel sounds.  EXTREMITIES:  No cyanosis, clubbing, or edema.  NEURO:  The patient is alert, oriented, and grossly nonfocal.   PROBLEM LIST:  See above.  1. Patent foramen ovale with right-to-left shunt.  2. Migraine headaches versus transient ischemic attacks.  3. Silent right brain stroke, temporoparietal.  4. Visual changes.  5. Inability to cannulate the esophagus with a transesophageal      echocardiography probe.  6. The patient declines Coumadin.   PLAN:  1. The patient will be given a CardioNet monitor to see if she has      episodes of atrial fibrillation which certainly would require her      to take Coumadin in light of her underlying pathology.  2. Place a call to Dr. Regino Schultze and we will refer the patient to Dr. Bernette Redbird for further decision regarding possible treatment of the      PFO.  We are unable to cannulate the patient's esophagus or TEE,      but intracardiac ultrasound could be done at [facility] after      cardiac catheterization will be done for shunt calculation.     Learta Codding, MD,FACC  Electronically Signed    GED/MedQ  DD: 08/20/2008  DT: 08/21/2008  Job #: [id number]   cc:   Bernette Redbird

## 2010-07-19 NOTE — Op Note (Signed)
NAME:  [name] [name], [name] [name]            ACCOUNT NO.:  [id number]   MEDICAL RECORD NO.:  [id number]          PATIENT TYPE:  INP   LOCATION:  2550                         FACILITY:  [facility]   PHYSICIAN:  Mark C. Ophelia Charter, M.D.    DATE OF BIRTH:  1960/10/28   DATE OF PROCEDURE:  04/08/2007  DATE OF DISCHARGE:                               OPERATIVE REPORT   POSTOPERATIVE DIAGNOSIS:  Degenerative disk disease L4-5, L5-S1 with  recurrent right foraminal stenosis L4-5 and biforaminal stenosis L5-S1.   PROCEDURE:  Redo decompression and transforaminal lumbar interbody  fusion L4-5.  Transforaminal lumbar interbody fusion L5-S1 both right  side.  Pedicle instrumentation, local bone, pedicle aspiration plus  Vitoss and bilateral gutter fusion.  Polaris 35 x 6.5 screws and 65-mm  titanium Biomet Polaris rod.  Crossbar.  9 mm PEEK spacer 4-5 and 7 mm  PEEK spacer at 5-1.   ESTIMATED BLOOD LOSS:  700 mL.  Cell saver retransfusion 280 mL.   SURGEON:  Jonah Blue, M.D.   ASSISTANT:  Wende Neighbors, P.A.   ANESTHESIA:  GOT plus 20 mL Marcaine local.   PROCEDURE:  After induction of general anesthesia the patient placed on  Jackson frame with spine frame attachment.  Careful padding positioning  arms with elbows at 90 axillary roll, neck was in good position.  Back  was prepped with DuraPrep.  The area was squared with towels after 10/10  drapes applied with tincture of Benzoin.  Sterile skin marker was used  at the midline using the old incision from the previous right L4-5  microdiskectomy.  Old scar was used extended proximally, distally and  Betadine Vidrape was applied.  Time-out procedure was taken prior to  proceeding.  Midline incision was made.  Subperiosteal dissection out to  the facets was performed.  Transverse processes were identified at L4,  L5 and S1.  Kocher clamp was placed over the 5-1 level and over the 4-5  level.  Cross-table C-arm picture was taken, confirmed that this  was  appropriate level and decompression was performed starting centrally  extending to the right and left.  There was a small bleb just below the  L4-5 disk level from the previous disk material initially without dural  leakage.  A 6-0 Nurolon suture was placed.  As the needle went through a  piece the dura there was some leakage from around the 6-0 needle site.  Operative microscope was used for the decompression and two simple  sutures were placed with some Tisseel with a watertight closure.  Extensive scar tissue in the lateral gutter with some recurrent  foraminal stenosis and thick chunks of ligament was some central  stenosis not well appreciated on the myelo CT was noted.  This was  identified on the right side.  Small veins were coagulated with bipolar  cautery.  Disk was opened with a scalpel after poking through placing a  Penfield #4 and confirming this 4-5 space as expected.  Sequencing  through the normal instrumentation for TLIF including blade cutting  chisels 7 and 9, box cutting 7 and 9, ring curette, angled curettes,  straight curette and Epstein type curettes were used with straight and  upbiting micropituitaries.  Continued work was used on the endplate  scraping the endplate.  The annulus anteriorly was intact.  The patient  had worked up with MRI and apparently had the diskogram CT which showed  extrusion of dye material the right side around the nerve root which was  corresponding with the patient's pain and near obliteration of the 5-1  disk space with some biforaminal stenosis which on diskogram also  reproduced her pain.  3-4 level was not painful.  Pieces from the lamina  and the facets from the TLIF on the right were cut in small piece of  bone packed anteriorly at the 4-5 level and then a 9 mm PEEK spacer was  placed  Bone was packed in the middle of the PEEK spacer as well as  anterior to the spacer and it was advanced checking position until it  was at the  midline and almost transverse.  It was countersunk several  millimeters.  Identical procedure was repeated at the L5-1 level.  There  was some epidural bleeding.     Epidural veins were managed with the bipolar cautery some Thrombin  soaked Gelfoam and patties.  Disk space at 5 was extremely tight only 7  mm cage with enter only after the box cutter and significant amount of  curetting was performed.  At this level due to the tightness a cage was  countersunk but it would not advance the transverse type position.  Bone  been packed anterior to cage and likely opposite side microdiskectomy  would have been needed to possibly get the cage a little bit more  transverse but since it was countersunk there was significant narrowing  already and the cage was countersunk and secure.  It was left in  position.  There was no obliquity on AP x-ray and plate and cage was  close to midline but just not kicked completely transverse.  Thick  chunks of ligament removed from both the foramina and bone spurs removed  from the foraminal stenosis worse the right and left.  Nerve root was  carefully followed out the foramina, nerve root L5 with just above the  TLIF was carefully protected as the cage was advanced.  Next pedicle  screws were placed at L4, L5 and S1 on the right and I switched sides  with assistant, Maud Deed.  __________  sequence for placing the  pedicle screw was the starter using the CT scan for appropriate  position, checking under fluoroscopy followed by the joystick pedicle  finder followed by pedicle feeler after spot fluoro picture tapping,  followed by pedicle feeler and medial cephalad inferior and lateral.  There was bone anteriorly on awl probing and 35 mm 6.5 screws were  chosen for all six positions.  The transverse process on the left at the  L4 superior aspect of it cracked, inferior aspect out to the lateral tip  was still in good position and then prior to placing screws.   All  transverse processes were decorticated as well as a lateral gutter.  After screws were placed, 65 mm rods were placed tightening down the  right TLIF side first with the rod compressor. Opposite side was then  compressed.  Cross connector was placed between L4-5 large-size, all  caps were secure.  Soft tissue was carefully checked to make sure it was  not in place as the caps were tightened down and clicked  a 100 pounds  torque.  A long pick was placed on the right side of the incision after  bone grafts had been placed laterally with the Vitoss.  With pedicle  screw placement after tapping and prior to placing the screw  decorticating transverse process the pedicles were aspirated of bone  marrow.  10 mL Vitoss was cut into strips for the left side, wide  matchsticks and on the right side, rolled up in a fajita with local bone  placed in the middle.  Transverse process were meticulously exposed so  the bone was packed down with the Vitoss all the way to decorticated  sacrum and on the opposite left side.  Final spot fluoro pictures were  taken.  Satisfactory position of the rods and pedicle screws.  Fascia  was closed with 0-0 Vicryl, 2-0 Vicryl subcutaneous tissue, 4-0 Vicryl  subcuticular closure.  Tincture of Benzoin, Steri-Strips.  Prior to  closure the cages were checked on right side to make sure that no bone  had fallen down, next the cage near the nerve root and they were clear  at both 4-5 and 5-1 level.  The patient tolerated procedure well was  transferred to recovery room.      Mark C. Ophelia Charter, M.D.  Electronically Signed     MCY/MEDQ  D:  04/08/2007  T:  04/09/2007  Job:  [id number]

## 2010-07-19 NOTE — Discharge Summary (Signed)
[name] [name], [name] [name]            ACCOUNT NO.:  [id number]   MEDICAL RECORD NO.:  [id number]          PATIENT TYPE:  INP   LOCATION:  5033                         FACILITY:  [facility]   PHYSICIAN:  Mark C. Ophelia Charter, M.D.    DATE OF BIRTH:  01/29/1961   DATE OF ADMISSION:  04/08/2007  DATE OF DISCHARGE:  04/12/2007                               DISCHARGE SUMMARY   ADMISSION DIAGNOSES:  1. Degenerative disk disease of L4-L5, L5-S1 with recurrent right      foraminal stenosis of L4-L5 and biforaminal stenosis at L1-S1.  2. Migraine headaches.  3. History of Graves disease.  4. History of Hashimoto's disease status post radioactive iodine      treatment.  5. Bilateral cataracts.  6. Status post hysterectomy.  7. Status post cystoscopy secondary to abnormal bladder cells.  No      cancer was found.  8. Tobacco abuse.  Currently on cessation program.   DISCHARGE DIAGNOSES:  1. Degenerative disk disease of the L4-L5, L5-S1 with recurrent right      foraminal stenosis of L4-L5 and biforaminal stenosis at L1-S1.  2. Migraine headaches.  3. History of gravis disease.  4. History of Hashimoto's disease status post radioactive iodine      treatment.  5. Bilateral cataracts.  6. Status post hysterectomy.  7. Status post cystoscopy secondary to abnormal bladder cells.  No      cancer found.  8. Tobacco abuse.  Currently on cessation program.  9. Post hemorrhagic anemia requiring blood transfusion and vaginal      candidiasis.   PROCEDURES:  On April 08, 2007, the patient underwent redo  decompression and transforaminal lumbar interbody fusion of L4-L5.  Transforaminal lumbar antibody fusion of L5-S1 both.  Pedicle screw  instrumentation, L4-L5 and L5-S1 with Vitoss and local bone graft.  This  was performed by Dr. Ophelia Charter, assisted by Maud Deed, P-AC under  general anesthesia.   CONSULTATIONS:  None.   BRIEF HISTORY:  The patient is a 50 year old white female status post  right L4-L5  microdiskectomy in October 2007.  She has had recurring low  back pain and bilateral leg pain, right greater than left.  Diskogram  indicated annular tears at L3-L4, L4-L5, and L5-S1 with biforaminal  narrowing.  The patient was noted to be most symptomatic at the L4-L5  and L5-S1 levels.  It was felt that she would benefit from surgical  intervention and was admitted for the procedure as stated above.   HOSPITAL COURSE:  The patient tolerated the procedure under general  anesthesia without complications.  Postoperatively, neurovascular motor  function of lower extremities was noted to be intact.  The  patient  utilized PCA analgesics for pain control and was gradually weaned to  p.o. analgesics.  On the first postoperative day, she did have  hypotension and was found to have hemoglobin and hematocrit decreased to  8.4 and 24.5 respectively.  She was transfused with 2 units of packed  red blood cells.  She was also given fluid bolus.  Her hypotension  resolved.  The patient had nausea, which required IV Reglan.  She was  monitored closely for ileus and diet was held until bowel function  returned.  After she resolved her nausea and was having flatus, she was  able to start a regular diet, which she tolerated well.  Foley catheter  was discontinued and she was able to void without difficulty.  Dosing  changes were done daily and wound was found to be healing without  erythema, edema, or drainage.  Physical therapy was initiated and the  patient was able to progressively increase her ambulation.  Prior to  discharge, she was ambulating 150 feet with a rolling walker.  Lumbar  brace was utilized when out of bed.  The patient received occupational  therapy for ADLs and demonstrated independence with these prior to  discharge.  The patient complained of symptoms of vaginal yeast  infection at the day of discharge and was given prescription for  Diflucan to use once she is at home.  On April 12, 2007 she was able  to be discharged at home with arrangements home health physical therapy,  occupational therapy, and bath aid.   Other pertinent laboratory values, admission coagulation studies were  within normal limits.  Chemistry studies on admission were within limits  and repeat postoperatively normal as well.  Calcium with lowest value of  7.8 and BUN of 5.  Urinalysis on admission negative for urinary tract  infection.   PLAN:  The patient was discharged to her home.  She was encouraged to  wear her brace at all times when out of bed and will utilize a walker  for ambulation.  She will change her dressing daily or as needed and  will be allowed to shower if there is no wound drainage.  The patient  will follow up with Dr. Ophelia Charter 2 weeks from the day of surgery and was  advised to call to arrange the appointment.  She will resume a regular  diet at home.  Medication reconciliation was reviewed with the patient.  She was instructed to continue her Synthroid, her Chantix, and  cetirizine.  She will discontinue her tramadol, hydrocodone, and Daypro.  Prescriptions were given for OxyContin 20 mg 1 by mouth every 12 hours,  Tylox 1-2 q.4-6 h. as needed for breakthrough pain, Robaxin 500 mg 1 q.6-  8 h. as needed for spasm, and Diflucan 150 mg once as directed.  She  will use over-the-counter stool softeners daily and laxative or enema as  need for constipation.  The patient was advised to call the office with  any questions or concerns prior to her return office visit.  All  questions encouraged and answered.      Wende Neighbors, P.A.      Mark C. Ophelia Charter, M.D.  Electronically Signed    SMV/MEDQ  D:  05/31/2007  T:  06/01/2007  Job:  [id number]

## 2010-07-22 NOTE — Op Note (Signed)
NAME:  [name] [name], [name] [name]            ACCOUNT NO.:  [id number]   MEDICAL RECORD NO.:  [id number]          PATIENT TYPE:  AMB   LOCATION:  SDS                          FACILITY:  [facility]   PHYSICIAN:  Mark C. Ophelia Charter, M.D.    DATE OF BIRTH:  Oct 05, 1960   DATE OF PROCEDURE:  12/06/2005  DATE OF DISCHARGE:                                 OPERATIVE REPORT   PREOPERATIVE DIAGNOSIS:  Right L4-5 herniated nucleus pulposus.   POSTOPERATIVE DIAGNOSIS:  Right L4-5 herniated nucleus pulposus.   PROCEDURES:  Exploration of 3-4, right L4-5 microdiskectomy.   SURGEON:  Mark C. Ophelia Charter, M.D.   ANESTHESIA:  GOT.   ESTIMATED BLOOD LOSS:  Minimal.   COMPLICATION:  A 1-mm dural tear, repaired with a single suture of Nurolon  and Tisseel watertight closure.   DESCRIPTION OF PROCEDURES:  After induction of general anesthesia and  orotracheal intubation, the patient received preoperative Ancef and  vancomycin and time-out was taken for the planned right L4-5  microdiskectomy, and needle localization with a spinal needle was taken and  exposure was made.  Laminotomy was done based on x-ray, and there was some  hypertrophic ligamentum and mild bulging of the disk.  The annulus was  incised and passes were made, removing degenerative disk material, but it  did not correspond well with the scan.  A #4 Penfield was placed in the disk  space.  A second x-ray was taken, which showed that this was 1 level high at  3-4.  The Taylor retractor was moved distally, and the skin incision was  extended about 5 mm and the Taylor retractor was placed at the 4-5 level.  Laminotomy was performed at L4 on the right.  There was some overhang  ligament, similar to the 3-4 level, with thickening of the ligament, with  more mild stenosis with narrow interpedicular distance than appreciated by  preoperative MRI scan.  Once overhang was trimmed back, the dura was gently  retracted towards the middle.  With removal of the small  spurs laterally,  there appeared to be a small spinal fluid leak.  A paddy was placed over the  dura.  The dura was gently retracted towards the middle and microdiskectomy  was performed, removing the large bulge.  Disk fragments were underneath the  annulus.  They were teased out with an Epstein curet, grasped with  micropituitary, up-and-down and straight pituitaries.  Once several large  chunks came out, the disk was well decompressed.  The foramen was enlarged.  The nerve root was free.  A little bit of extra lamina was removed, leaving  the upper half of the lamina on the right at L4.  And the patient had the  bottom third of the lamina on L3 for the initial exposure, which was 1 level  up than the planned procedure.  With the exposure of the dura, there was a  pin-point tear.  An 18-gauge spinal needle that had been used for  localization was placed down in the operative field to look at the size.  It  was the same size as the 18-gauge needle.  Nurolon 4-0 was used with the  microinstruments, and a single suture under the operating microscope was  used.  This was watertight and back to 30 cmH20 with no leakage.  There were  some veins in the gutter that were coagulation at both levels, with careful  protection of the dura.  The nerve root was free.  The disk was flat.  Anteriorly, I could pass this with a hockey stick 180-degree sweep.  Anterior to the dura showed no areas of compression.  It was decided to mix  some Tisseel, since it was available, and to drop a few drops directly over  the repair, and then the fascia was closed with 0 Vicryl and 2-0 Vicryl in  the subcutaneous tissue and 4-0 subcuticular Vicryl skin closure.  Tincture  of benzoin, Steri-Strips, Marcaine infiltration in the skin.  The patient  tolerated the procedure well and transferred to the recovery room in  stable condition.  Additional exposure of 3-4 on the right level was  discussed with the patient and the  husband, and the plan will be to keep the  patient flat until morning, and then she will be able to ambulate, walk in  the halls, and will be discharged to home.      Mark C. Ophelia Charter, M.D.  Electronically Signed     MCY/MEDQ  D:  12/06/2005  T:  12/06/2005  Job:  [id number]

## 2010-08-02 ENCOUNTER — Ambulatory Visit (INDEPENDENT_AMBULATORY_CARE_PROVIDER_SITE_OTHER): Payer: BC Managed Care – PPO | Admitting: "Endocrinology

## 2010-08-02 ENCOUNTER — Other Ambulatory Visit: Payer: Self-pay | Admitting: "Endocrinology

## 2010-08-02 ENCOUNTER — Ambulatory Visit
Admission: RE | Admit: 2010-08-02 | Discharge: 2010-08-02 | Disposition: A | Discharge status: Home / Self Care | Payer: BC Managed Care – PPO | Source: Ambulatory Visit | Attending: "Endocrinology | Admitting: "Endocrinology

## 2010-08-02 VITALS — BP 130/78 | HR 72 | Temp 97.3°F | Wt 180.1 lb

## 2010-08-02 DIAGNOSIS — R220 Localized swelling, mass and lump, head: Secondary | ICD-10-CM

## 2010-08-02 DIAGNOSIS — E669 Obesity, unspecified: Secondary | ICD-10-CM

## 2010-08-02 DIAGNOSIS — M542 Cervicalgia: Secondary | ICD-10-CM

## 2010-08-02 DIAGNOSIS — R221 Localized swelling, mass and lump, neck: Secondary | ICD-10-CM

## 2010-08-02 DIAGNOSIS — I10 Essential (primary) hypertension: Secondary | ICD-10-CM

## 2010-08-02 MED ORDER — IOHEXOL 300 MG/ML  SOLN
75.0000 mL | Freq: Once | INTRAMUSCULAR | Status: AC | PRN
  Administered 2010-08-02: 75 mL via INTRAVENOUS

## 2010-08-11 ENCOUNTER — Encounter: Payer: Self-pay | Admitting: *Deleted

## 2010-08-18 ENCOUNTER — Encounter: Payer: Self-pay | Admitting: Cardiology

## 2010-09-13 ENCOUNTER — Other Ambulatory Visit: Payer: Self-pay | Admitting: *Deleted

## 2010-09-13 DIAGNOSIS — E038 Other specified hypothyroidism: Secondary | ICD-10-CM

## 2010-09-19 ENCOUNTER — Other Ambulatory Visit: Payer: Self-pay | Admitting: Nurse Practitioner

## 2010-09-19 DIAGNOSIS — Z1231 Encounter for screening mammogram for malignant neoplasm of breast: Secondary | ICD-10-CM

## 2010-09-24 NOTE — Progress Notes (Addendum)
CC: New-onset swelling of her right lateral neck in supraclavicular fossa as of 07/26/2010.   History of present illness: The patient is a 50 year old Caucasian woman. She has multiple medical problems who which have been previously discussed. 1. The patient patient first presented to me on 08/10/04 in referral from her primary care provider, Dr. Frederica Kuster in [place], [place], for evaluation and management of hyperthyroidism. In retrospect, the patient had chronic problems with infected sinuses, inner ear symptoms, and spinning dizziness. She also had complaints of severe fatigue and weight gain. Dr. Jonette Eva had performed thyroid tests on 06/14/04. TSH was 0.13, free T4-1 0.04. Thyroid ultrasound showed small and large nodules. Thyroid gland was somewhat enlarged overall. On examination she had a 20+ gram thyroid gland. Thyroid gland was firm, but nontender. She also had some diastolic hypertension. Thyroid tests showed a TSH of 0.437, free T4-1 0.01, and free T3 of 2.6. Her TPO antibody level was 29 9. Her TSI level was 98. It appeared at that time that the patient likely had some elevation of thyroid hormone due to Hashimoto's disease. During the next 2 years, her TSH values varied from 0.350-0.854. By February of 2008, however, her free her TSH dropped to 0.008, her free T4 had increased to 2.08, and her free T3 and increased to 5.8. She was clearly hyperthyroid and thyrotoxic. TSI level at that point was 1.8 with normal being 1.3 or less. On 05/25/06 showed underwent radioactive iodine treatment with I-131. She subsequently became hypothyroid and was started on Synthroid replacement therapy. Her doses of Synthroid have been adjusted several times.  2. In early 2010 the patient began to complain of breast milk discharge. 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 Prolactin was high-normal at 16.4. Repeat MRI was performed on 05/13/08. 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 Pituitary gland looked normal. Was no evidence of a pituitary tumor. Shortly thereafter the prolactin levels declined to the 7.5 and the galactorrhea ceased. At the patient's last scheduled visit on 1/26th 12, she felt "all right except for all of my other problems". Her low back pain and sciatica were limiting her ability to exercise. She was getting quite tired very easily. Lab studies 2 weeks previously showed a TSH of 0.5-9 and free T4 of 1.54. I wondered whether her thyroid hormone might be relatively too high for her and might interfere with her sleep, so I reduced the dose of Synthroid to 100 mcg 4 days per week and 50 mcg 3 days per week. However, and thyroid studies on 06/13/10 showed a TSH of 5.2 and a free T4-1 0.48, I again adjusted her Synthroid dose to one tablet 6 days per week and one half tablet on Sunday.  3. About 07/26/10, the patient suddenly developed, saw, and felt swelling of her right lateral neck and right supraclavicular area. She said she subsequently developed swelling of the left side of her neck and left supraclavicular area the next day. Left supraclavicular fossa is somewhat tender today. She has no history of pulmonary problems that would suggest possible cancer, but she was a smoker. She also has no history of hematologic or vascular problems which would suggest thrombosis. She knew that if she went to her good PCP, Dr. Jonette Eva in [place], he would refer her to a local ENT there. However, since she had no confidence in the local ENTs in [place], she did not want to see them,  but since she also did not want to insult Dr. Jonette Eva, she presented here instead. 3. PROS: Constitutional: The patient feels well, is healthy, and has no other significant complaints. Eyes: Vision is good. There are no significant eye complaints. Neck: The patient has no complaints of anterior neck swelling, soreness, tenderness,  pressure, discomfort, or  difficulty swallowing. Her swelling and discomfort is related to the supraclavicular fossae and the immediately adjacent lateral neck areas.  Heart: Heart rate increases with exercise or other physical activity. The patient has no complaints of palpitations, irregular heat beats, chest pain, or chest pressure. Gastrointestinal: Bowel movents seem normal. The patient has no complaints of excessive hunger, acid reflux, upset stomach, stomach aches or pains, diarrhea, or constipation. Legs: Muscle mass and strength seem normal. There are no complaints of numbness, tingling, burning, or pain. No edema is noted. Feet: There are no obvious foot problems. There are no complaints of numbness, tingling, burning, or pain. No edema is noted.  ROS: There are no other significant problems involving her other six body systems.  PHYSICAL EXAM: BP 130/78  Pulse 72  Temp 97.3 F (36.3 C)  Wt 180 lb 1.6 oz (81.693 kg) She has lost 2 lbs since last visit. Constitutional: The patient looks healthy and appears physically and emotionally well.  Eyes: There is no arcus or proptosis.  Mouth: The oropharynx appears normal. The tongue appears normal. There is normal oral moisture. There is no obvious gingivitis. Neck: There are no bruits present. The thyroid gland appears normal in size. The thyroid gland is approximately 20 grams in size. The consistency of the thyroid gland is normal. There is no thyroid tenderness to palpation. She has bilateral fullness of the supraclavicular fossae. I do not feel a mass effect on either side. The left supraclavicular fossa is somewhat tender to palpation. I do not feel any appreciable neck adenopathy.  Lungs: The lungs are clear. Air movement is good. Heart: The heart rhythm and rate appear normal. Heart sounds S1 and S2 are normal. I do not appreciate any pathologic heart murmurs. Abdomen: The abdominal size is enlarged. Bowel sounds are normal. The abdomen is soft and non-tender.  There is no obviously palpable hepatomegaly, splenomegaly, or other masses.  Arms: Muscle mass appears appropriate for age.  Hands: There is no obvious tremor. Phalangeal and metacarpophalangeal joints appear normal. Palms are normal. Legs: Muscle mass appears appropriate for age. There is no edema.  Neurologic: Muscle strength is normal for age and gender  in both the upper and the lower extremities. Muscle tone appears normal. Sensation to touch is normal in the legs and feet.  ASSESSMENT: 1. The swelling in the supraclavicular fossae may well be benign. Since she has gained some weight over time, it is possible we are looking at just fatty tissue. However, to rule out malignancy we'll need to obtain a chest CT and make a referral to ENT. 2. Obesity: She has lost 2 pounds since her last visit. 3. Hypertension: BP is surprisingly good today.   PLAN: 1. Diagnostic: I will order a CT of the chest with and without contrast today. 2. Therapeutic: I called Dr. Christia Reading of [facility]. I discussed the patient's case with him in her presence. He concurred with the order of the CT scan. He will see her immediately after the CT is performed. 3. Patient education: I discussed with the patient the differential diagnosis of supraclavicular swelling. Although this is likely to be benign and secondary to her  weight gain, it is possible that some type of malignancy is present. It is imperative that she follow through with the CT scan and with a referral to Dr. Jenne Pane. If she has any questions or concerns and is afraid to follow through with either the CT scan or the referral, I've asked her to call me immediately so that we can discuss those concerns. 4. Follow-up: Patient will contact me immediately after her meeting with Dr. Jenne Pane. If necessary I will see her immediately. Otherwise I will see her at her next scheduled appointment time.    Level of Service: This visit lasted in excess of 40 minutes. More  than 50% of the visit was devoted to counseling.

## 2010-09-29 ENCOUNTER — Ambulatory Visit: Payer: Self-pay | Admitting: Cardiology

## 2010-09-29 ENCOUNTER — Ambulatory Visit (INDEPENDENT_AMBULATORY_CARE_PROVIDER_SITE_OTHER): Payer: BC Managed Care – PPO | Admitting: "Endocrinology

## 2010-09-29 VITALS — BP 111/73 | HR 78 | Wt 179.6 lb

## 2010-09-29 DIAGNOSIS — E7801 Familial hypercholesterolemia: Secondary | ICD-10-CM

## 2010-09-29 DIAGNOSIS — Z720 Tobacco use: Secondary | ICD-10-CM

## 2010-09-29 DIAGNOSIS — E7800 Pure hypercholesterolemia, unspecified: Secondary | ICD-10-CM

## 2010-09-29 DIAGNOSIS — E063 Autoimmune thyroiditis: Secondary | ICD-10-CM

## 2010-09-29 DIAGNOSIS — I10 Essential (primary) hypertension: Secondary | ICD-10-CM

## 2010-09-29 DIAGNOSIS — F17200 Nicotine dependence, unspecified, uncomplicated: Secondary | ICD-10-CM

## 2010-09-29 DIAGNOSIS — I69959 Hemiplegia and hemiparesis following unspecified cerebrovascular disease affecting unspecified side: Secondary | ICD-10-CM

## 2010-09-29 DIAGNOSIS — E038 Other specified hypothyroidism: Secondary | ICD-10-CM

## 2010-09-29 DIAGNOSIS — I69359 Hemiplegia and hemiparesis following cerebral infarction affecting unspecified side: Secondary | ICD-10-CM

## 2010-09-29 DIAGNOSIS — R1013 Epigastric pain: Secondary | ICD-10-CM

## 2010-09-29 NOTE — Patient Instructions (Signed)
Follow-up visit in 3 months. Please discontinue Crestor. Please start pravastatin tonight. Please repeat lab tests in about two months, middle-to-late September.

## 2010-09-30 ENCOUNTER — Encounter: Payer: Self-pay | Admitting: Cardiology

## 2010-09-30 ENCOUNTER — Ambulatory Visit (INDEPENDENT_AMBULATORY_CARE_PROVIDER_SITE_OTHER): Payer: BC Managed Care – PPO | Admitting: Cardiology

## 2010-09-30 VITALS — BP 126/80 | HR 72 | Resp 18 | Ht 63.0 in | Wt 178.8 lb

## 2010-09-30 DIAGNOSIS — Q211 Atrial septal defect: Secondary | ICD-10-CM

## 2010-09-30 DIAGNOSIS — I10 Essential (primary) hypertension: Secondary | ICD-10-CM

## 2010-09-30 DIAGNOSIS — I6350 Cerebral infarction due to unspecified occlusion or stenosis of unspecified cerebral artery: Secondary | ICD-10-CM

## 2010-09-30 NOTE — Patient Instructions (Signed)
Continue all current medications. Your physician wants you to follow up in: 6 months.  You will receive a reminder letter in the mail one-two months in advance.  If you don't receive a letter, please call our office to schedule the follow up appointment   

## 2010-10-02 ENCOUNTER — Encounter: Payer: Self-pay | Admitting: Cardiology

## 2010-10-02 NOTE — Assessment & Plan Note (Signed)
No recurrent stroke. The patient continues to decline long-term use of Coumadin

## 2010-10-02 NOTE — Assessment & Plan Note (Signed)
Blood pressure controlled. No change in medical therapy

## 2010-10-02 NOTE — Progress Notes (Signed)
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 She was eventually referred for percutaneous closure of patent foramen ovale with an occluder device. She has been followed at [facility] because apparently the device was slightly tilted, although per the patient report currently there is no significant right-to-left shunt. She receives regular followup appointments but wants to continue and reestablish with our practice. She reports no recurrent TIAs. She is currently only taking aspirin and initially was also Plavix but this has been discontinued. The patient previously has declined Coumadin for long-term therapy for her cryptogenic stroke. Also, the reason why the patient was referred for placement of an occluder device. From a cardiac standpoint the patient reports occasional sharp pains but at rest on exertion. However she has no prolonged chest pain on exertion. Her symptoms are atypical. She has occasional palpitations. She feels fatigued most of the time. Is currently on disability. She has significant problems with chronic back pain. A limited bedside echocardiogram was performed. This was technically very difficult study but LV and RV function appears to be normal and color Doppler I could not see a significant right-to-left shunt. He did appear that the occluder device was slightly tilted and was not aligned parallel with the intra-atrial septum.  Allergies  Allergen Reactions  . Doxycycline     REACTION: Facial numbness, Nausea  . Metronidazole     REACTION: "blood vessels come up to top of skin"    Current Outpatient Prescriptions on File Prior to Visit  Medication Sig Dispense Refill  . aspirin 325 MG EC tablet Take 325 mg by mouth daily.        . carvedilol (COREG) 12.5 MG tablet Take 12.5 mg by mouth 2 (two) times daily.        . Cholecalciferol (VITAMIN D) 2000 UNITS tablet Take 2,000  Units by mouth 2 (two) times daily.       Marland Kitchen HYDROCODONE-ACETAMINOPHEN PO Take by mouth.        . levothyroxine (SYNTHROID, LEVOTHROID) 100 MCG tablet Take 100 mcg by mouth daily.        . Magnesium Oxide 250 MG TABS Take 250 mg by mouth daily.        . methocarbamol (ROBAXIN) 500 MG tablet Take 500 mg by mouth daily.        Marland Kitchen Clopidogrel Bisulfate (PLAVIX PO) Take by mouth.        Marland Kitchen Specialty Vitamins Products (WOMENS MENOPAUSE VITA PAK) MISC Take by mouth daily.          Past Medical History  Diagnosis Date  . Galactorrhea syndrome   . Urinary calculus   . Thyroid disease     Hypothyroidism, post-radiation  . Vertigo   . Dyspepsia   . Fatigue   . Hypertension   . GERD (gastroesophageal reflux disease)   . Stroke     Silent right brain stroke/cryptogenic stroke  . Patent foramen ovale     Status post device closure  at [facility]  25 mmGore Helix occluder device  . Migraine headache     Versus TIAs  . Visual changes   . Esophagus disorder     Unable to cannulate esophagus with transesophageal echocardiographic probe  . Allergy to Coumadin     Not an allergy but pt declines Coumadin    Past Surgical History  Procedure Date  . Total abdominal hysterectomy 1997    No bso  .  I-131 treatment 05/25/2006    Family History  Problem Relation Age of Onset  . Stroke Other   . Hypertension Other     History   Social History  . Marital Status: Married    Spouse Name: N/A    Number of Children: N/A  . Years of Education: N/A   Occupational History  . Public relations account executive    Social History Main Topics  . Smoking status: Smoker, Current Status Unknown  . Smokeless tobacco: Not on file  . Alcohol Use: Yes     Rarely  . Drug Use: No  . Sexually Active:    Other Topics Concern  . Not on file   Social History Narrative  . No narrative on file    AVW:[id number] positives as outlined above. The remainder of the 18  point review of systems is  negative  PHYSICAL EXAM BP 126/80  Pulse 72  Resp 18  Ht 5\' 3"  (1.6 m)  Wt 178 lb 12.8 oz (81.103 kg)  BMI 31.67 kg/m2  SpO2 98%  General: Well-developed, well-nourished in no distress Head: Normocephalic and atraumatic Eyes:PERRLA/EOMI intact, conjunctiva and lids normal Ears: No deformity or lesions Mouth:normal dentition, normal posterior pharynx Neck: Supple, no JVD.  No masses, thyromegaly or abnormal cervical nodes Lungs: Normal breath sounds bilaterally without wheezing.  Normal percussion Cardiac: regular rate and rhythm with normal S1 and S2, no S3 or S4.  PMI is normal.  No pathological murmurs Abdomen: Normal bowel sounds, abdomen is soft and nontender without masses, organomegaly or hernias noted.  No hepatosplenomegaly MSK: Back normal, normal gait muscle strength and tone normal Vascular: Pulse is normal in all 4 extremities Extremities: No peripheral pitting edema Neurologic: Alert and oriented x 3 Skin: Intact without lesions or rashes Lymphatics: No significant adenopathy Psychologic: Normal affect  ECG: Normal sinus rhythm. Otherwise tracing within normal limits.  ASSESSMENT AND PLAN

## 2010-10-02 NOTE — Assessment & Plan Note (Signed)
Patient is status post occluder device which is slightly moved but apparently without a residual complications. The patient is currently on aspirin and there appears to be no significant residual right-to-left shunt. The patient still followed closely at [facility] for recurrent complications.

## 2010-10-17 ENCOUNTER — Ambulatory Visit
Admission: RE | Admit: 2010-10-17 | Discharge: 2010-10-17 | Disposition: A | Discharge status: Home / Self Care | Payer: BC Managed Care – PPO | Source: Ambulatory Visit | Attending: Nurse Practitioner | Admitting: Nurse Practitioner

## 2010-10-17 DIAGNOSIS — Z1231 Encounter for screening mammogram for malignant neoplasm of breast: Secondary | ICD-10-CM

## 2010-11-06 ENCOUNTER — Encounter: Payer: Self-pay | Admitting: "Endocrinology

## 2010-11-06 NOTE — Progress Notes (Signed)
CC: Followup hypothyroid, hypertension, goiter, dyspepsia, fatigue, GERD, Hashimoto's disease, status post CVA, right hemiparesis, hyperlipidemia, smoking, and head/neck masses    History of present illness: The patient is a 50 year old Caucasian woman.  1. The patient was hyperthyroid due to Graves' disease in 2008. She was treated with radioactive iodine and became hypothyroid. She's also had one or more prior CVAs, which have resulted in some mild right hemiparesis and perhaps some memory losses. She also had a 3-4 month episode of galactorrhea in 2010 which subsequently resolved spontaneously. On 08/02/2010 she presented with increased swelling of both supraclavicular areas. The CT scan was performed which showed no significant pathology. She was evaluated by Dr. Christia Reading of [facility]. Dr. Jenne Pane could not identify any cause of the supraclavicular area swelling. That swelling has now decreased significantly. The patient feels that even after reducing her Crestor dose to 5 mg every other day, she is still gaining muscle cramps. 2. PROS: Constitutional: She remains quite tired. Problems with her back pain not only affect her ability to do physical activities, but also wake her up from sleep frequently. Her mouth is dry and a lot. Feels almost like she's got something in her throat. She is able to swallow coated pills much better. Eyes: Vision is good. She had a recent eye exam and early macular degeneration was noted. She was also noted to have dry eyes.  Neck: The patient has no complaints of anterior neck swelling, soreness, tenderness,  pressure, discomfort, or difficulty swallowing. The swelling of the supraclavicular fossae is significantly less. She no longer has any discomfort in the supraclavicular areas.   Heart: Heart rate increases with exercise or other physical activity. The patient has no complaints of palpitations, irregular heat beats, chest pain, or chest  pressure. Gastrointestinal: Bowel movents seem normal. Her acid symptoms vary with foods.  Legs: Muscle mass and strength seem unchanged. Her right side remains somewhat weaker than her left. She still has sciatic symptoms episodic episodically. No edema is noted. Feet: There are no obvious foot problems. There are no complaints of numbness, tingling, burning, or pain. No edema is noted. Mental/psychological: He feels she is doing better. Her memory and abilities to think, to pay attention, and to make decisions seem better. Her depression is also somewhat better   ROS: There are no other significant problems involving her other six body systems.  PMFSH: 1. Her husband has left her since her last visit here. 2. She is now living alone.  PHYSICAL EXAM: BP 111/73  Pulse 78  Wt 179 lb 9.6 oz (81.466 kg) She has lost 2 lbs since last visit. Constitutional: The patient looks healthier than usual and appears physically and emotionally fairly well.  Eyes: There is no arcus or proptosis.  Mouth: The oropharynx appears normal. The tongue appears normal. There is normal oral moisture. There is no obvious gingivitis. Neck: There are no bruits present. The thyroid gland appears normal in size. The thyroid gland is approximately 20+ grams in size. The consistency of the thyroid gland is normal. There is no thyroid tenderness to palpation. The bilateral fullness of the supraclavicular fossae has essentially resolved. I do not feel a mass effect on either side. The supraclavicular fossa are no longer tender to palpation. I do not feel any appreciable neck adenopathy.  Lungs: The lungs are clear. Air movement is good. Heart: The heart rhythm and rate appear normal. Heart sounds S1 and S2 are normal. I do not appreciate any pathologic heart  murmurs. Abdomen: The abdominal size is enlarged. Bowel sounds are normal. The abdomen is soft and non-tender. There is no obviously palpable hepatomegaly, splenomegaly, or  other masses.  Arms: Muscle mass appears appropriate for age.  Hands: There is no obvious tremor. Phalangeal and metacarpophalangeal joints appear normal. Palms are normal. Legs: Muscle mass appears appropriate for age. There is no edema.  Feet: Her DP pulses are normal 1+ bilaterally. Neurologic: Right hand grip is slightly weaker than left. Right hip flexors are more significantly weaker than left. Sensation to touch is normal in the legs and feet.  LABS: 01/14/11  ASSESSMENT: 1. The swelling in the supraclavicular fossae appears to have been benign. The swelling is essentially resolved. No further evaluation is indicated at this time.  2. Obesity: She has lost another pound since her last visit. 3. Hypertension: BP is good today. 4. Hypothyroid: She is euthyroid this month, in the lower quartile of the normal range. For the near future I prefer to keep her at this end of the normal thyroid hormone range. 5. Hyperlipidemia: The patient's most recent lipid studies are better. Total cholesterol is 190 and her LDL is down to 80. Unfortunately, his does not seem to be tolerating Crestor very well. 6. Goiter: Thyroid gland seems smaller. 7. Dyspepsia: Certain foods seemed to provoke her symptoms. 8. Thyroiditis: Hashimoto's disease is clinically quiescent. 9. Tobacco: Have again asked her to stop smoking. Now that she is alone in her own home she will not be tempted to smoke along with her husband. 10. Right hemiparesis: We discussed the fact that her hand and arm strength are better than the strength of her leg and foot. She is continuing to per improve mentally over time. PLAN: 1. Diagnostic: I will order thyroid function test to be done in 2 months.  2. Therapeutic: We'll discontinue Crestor and begin pravastatin, 10 mg per day. I also increased her Synthroid dose to 100 mcg per day. 3. Patient education: We discussed the possible linkage between weight and her supraclavicular swelling.  4.  Follow-up: 3 months   Level of Service: This visit lasted in excess of 40 minutes. More than 50% of the visit was devoted to counseling.      Level of Service: This visit lasted in excess of 40 minutes. More than 50% of the visit was devoted to counseling.

## 2010-11-17 ENCOUNTER — Encounter: Payer: Self-pay | Admitting: *Deleted

## 2010-11-24 LAB — DIFFERENTIAL
Basophils Absolute: 0.1
Lymphocytes Relative: 33
Monocytes Absolute: 0.8
Monocytes Relative: 10
Neutro Abs: 4.4
Neutrophils Relative %: 53

## 2010-11-24 LAB — URINALYSIS, ROUTINE W REFLEX MICROSCOPIC
Bilirubin Urine: NEGATIVE
Ketones, ur: NEGATIVE
Nitrite: NEGATIVE
Specific Gravity, Urine: 1.014
Urobilinogen, UA: 0.2
pH: 5.5

## 2010-11-24 LAB — CBC
HCT: 41.5
MCHC: 33.8
MCV: 97.4
Platelets: 245
RDW: 14.3

## 2010-11-24 LAB — TYPE AND SCREEN

## 2010-11-24 LAB — COMPREHENSIVE METABOLIC PANEL
Albumin: 4.4
BUN: 11
Creatinine, Ser: 0.81
Glucose, Bld: 85
Total Protein: 6.8

## 2010-11-24 LAB — PROTIME-INR: INR: 0.9

## 2010-11-24 LAB — APTT: aPTT: 32

## 2010-11-24 LAB — ABO/RH: ABO/RH(D): O POS

## 2010-11-25 LAB — BASIC METABOLIC PANEL
BUN: 5 — ABNORMAL LOW
CO2: 28
CO2: 28
Chloride: 107
Chloride: 110
GFR calc Af Amer: >60
GFR calc non Af Amer: >60
Glucose, Bld: 100 — ABNORMAL HIGH
Glucose, Bld: 93
Potassium: 3.5
Potassium: 3.6
Sodium: 138
Sodium: 141

## 2010-12-26 ENCOUNTER — Other Ambulatory Visit: Payer: Self-pay | Admitting: "Endocrinology

## 2011-02-21 ENCOUNTER — Ambulatory Visit: Payer: BC Managed Care – PPO | Admitting: "Endocrinology

## 2011-03-06 ENCOUNTER — Telehealth: Payer: Self-pay | Admitting: "Endocrinology

## 2011-03-06 NOTE — Telephone Encounter (Signed)
Please see my telephone note below. 

## 2011-04-04 ENCOUNTER — Ambulatory Visit: Payer: BC Managed Care – PPO | Admitting: "Endocrinology

## 2011-05-15 ENCOUNTER — Telehealth: Payer: Self-pay | Admitting: "Endocrinology

## 2011-05-15 NOTE — Telephone Encounter (Signed)
I telephoned patient with the results of her lab tests drawn at her PCP's office in [place] on 05/09/11.  1. CMP was normal. TFTs were normal. Vitamin D was normal. Cholesterol was 214, triglycerides 67, HDL 73, and LDL 128. These labs were obtained when she was taking pravastatin 3 days per week . She has increased the pravastatin to 4 days per week, but can't go any higher due to severe muscle cramps. She has always had problems with statins, but has had fewer adverse effects with pravastatin up until now.  2. I offered her Zetia as an alternative. She would like to try it. She asked me to call in the prescription to her [facility] in [place], [phone]. I did so. Dovey Fatzinger J                                                                                                                                              ``````````````````````````             ```

## 2011-05-22 ENCOUNTER — Ambulatory Visit (INDEPENDENT_AMBULATORY_CARE_PROVIDER_SITE_OTHER): Payer: Medicare Other | Admitting: "Endocrinology

## 2011-05-22 ENCOUNTER — Encounter: Payer: Self-pay | Admitting: "Endocrinology

## 2011-05-22 DIAGNOSIS — E063 Autoimmune thyroiditis: Secondary | ICD-10-CM

## 2011-05-22 DIAGNOSIS — E669 Obesity, unspecified: Secondary | ICD-10-CM

## 2011-05-22 DIAGNOSIS — I10 Essential (primary) hypertension: Secondary | ICD-10-CM

## 2011-05-22 DIAGNOSIS — R222 Localized swelling, mass and lump, trunk: Secondary | ICD-10-CM

## 2011-05-22 DIAGNOSIS — R1013 Epigastric pain: Secondary | ICD-10-CM

## 2011-05-22 DIAGNOSIS — K3189 Other diseases of stomach and duodenum: Secondary | ICD-10-CM

## 2011-05-22 DIAGNOSIS — G8191 Hemiplegia, unspecified affecting right dominant side: Secondary | ICD-10-CM

## 2011-05-22 DIAGNOSIS — N643 Galactorrhea not associated with childbirth: Secondary | ICD-10-CM

## 2011-05-22 DIAGNOSIS — E038 Other specified hypothyroidism: Secondary | ICD-10-CM

## 2011-05-22 DIAGNOSIS — E069 Thyroiditis, unspecified: Secondary | ICD-10-CM

## 2011-05-22 DIAGNOSIS — E049 Nontoxic goiter, unspecified: Secondary | ICD-10-CM

## 2011-05-22 DIAGNOSIS — Z720 Tobacco use: Secondary | ICD-10-CM

## 2011-05-22 DIAGNOSIS — G8190 Hemiplegia, unspecified affecting unspecified side: Secondary | ICD-10-CM

## 2011-05-22 DIAGNOSIS — F17200 Nicotine dependence, unspecified, uncomplicated: Secondary | ICD-10-CM

## 2011-05-22 NOTE — Patient Instructions (Signed)
Followup visit in 4 months. Please call if having more breast milk leakage.

## 2011-05-22 NOTE — Progress Notes (Signed)
CC: Follow-up hypothyroid, hypertension, goiter, dyspepsia, fatigue, GERD, Hashimoto's disease, status post CVA, right hemiparesis, hyperlipidemia, smoking, and head/neck masses    History of present illness: The patient is a 51 year old Caucasian woman.  1. The patient was hyperthyroid due to Graves' disease in 2008. She was treated with radioactive iodine and became hypothyroid. She's also had one or more prior CVAs, which have resulted in some mild right hemiparesis and perhaps some memory losses. She also had a 3-4 month episode of galactorrhea in 2010 which subsequently resolved spontaneously. On 08/02/2010 she presented with increased swelling of both supraclavicular areas. A CT scan was performed which showed no significant pathology. She was evaluated by Dr. Christia Reading of [facility]. Dr. Jenne Pane could not identify any cause of the supraclavicular area swelling. That swelling has since decreased significantly. The patient was started on Crestor for hyperlipidemia, but could not tolerate even the 5 mg dose every other day because of severe muscle cramps. She was subsequently started on Zetia, 10 mg/day, which she has tolerated well.  2. The patient's last [facility] visit was on 09/29/10. She has been essentially healthy since that visit. She developed some galactorrhea in February, but that has since resolved. 3. Pertinent Review of Systems: Constitutional: She remains quite tired. Her mouth and throat are quite dry. When she tries to swallow it sometimes feels as if she's got something caught in her throat. She is able to swallow coated pills much better. Eyes: Vision is good. She had a recent eye exam. Her eye doctor tells her that she has signs of prior strokes in her eyes. Her cataracts are stable. She has the beginnings of macular degeneration.  Neck: She has a lot of postnasal drainage, which sometimes produces a sensation of difficulty swallowing. She has problems swallowing dry things. Ketchup  triggers throat spasm.She has no other complaints of anterior neck swelling, soreness, tenderness,  pressure, discomfort, or difficulty swallowing. The swelling of the supraclavicular fossae comes and goes. Swelling in the right Addison fossae is greater than on the right today.    Heart: Saw cardiologist in December. Reportedly does not have any problems Heart rate increases with exercise or other physical activity. The patient has no complaints of palpitations, irregular heat beats, chest pain, or chest pressure. Gastrointestinal: Heartburn has decreased. Nexium or Prilosec helps.   Legs: Muscle mass and strength seem unchanged. Her right side remains somewhat weaker than her left. She still has sciatic symptoms episodically. She notes some edema of the legs at times.  Feet: She is regaining feeling in both feet. She notes some edema at times, especially after spraining the ankle.  Mental/psychological: She feels she is doing better. Her memory and abilities to think, to pay attention, and to make decisions seem better. Her depression improved after her husband left her.    REVIEW OF SYSTEMS: There are no other significant problems involving her other body systems.  PAST MEDICAL, FAMILY, AND SOCIAL HISTORY:  1.Family:  Her husband left in March. She now lives alone. 2. Activities: She does pool exercises several times per week. 3. Tobacco: She resumed smoking. 4. Primary care provider: Dr. Ninfa Linden, [facility], office [phone], fax [phone].  PHYSICAL EXAM: BP 103/65  Pulse 76  Wt 188 lb (85.276 kg) She has lost 2 lbs since last visit. Constitutional: The patient looks healthier than usual and appears physically and emotionally fairly well.  Eyes: There is no arcus or proptosis. Mouth: The oropharynx appears normal. The tongue appears normal,  except for tobacco stains. There is normal oral moisture. There is no obvious gingivitis. Neck: There are no bruits present. The  thyroid gland appears normal in size. The thyroid gland is approximately 20 grams in size. The consistency of the thyroid gland is normal. There is no thyroid tenderness to palpation. There is some supraclavicular fullness on the right.  Lungs: The lungs are clear. Air movement is good. Heart: The heart rhythm and rate appear normal. Heart sounds S1 and S2 are normal. I do not appreciate any pathologic heart murmurs. Abdomen: The abdominal size is enlarged. Bowel sounds are normal. The abdomen is soft and non-tender. There is no obviously palpable hepatomegaly, splenomegaly, or other masses.  Arms: Muscle mass appears appropriate for age.  Hands: There is no obvious tremor. Phalangeal and metacarpophalangeal joints appear normal. Palms are normal. Legs: Muscle mass appears appropriate for age. There is only trace edema.  Neurologic: Right hand grip is comparable to the left. Both hip flexors are weak, right weaker than left. Sensation to touch is normal in the legs.  LABS: 05/09/11: CMP is normal. Cholesterol 214, triglycerides 67, HDL was 73, LDL 128. 25-OH-vitamin D 41.7. TSH 1.700. Free T4: 1.66. She started Zetia as of  05/15/11.  ASSESSMENT: 1. The swelling in the supraclavicular fossae improved with weight loss and recurred to a lesser degree with eight gain.   2. Obesity: She has gained 9 lbs in 9 months.  3. Hypertension: BP is good today. 4. Hypothyroid: She is euthyroid this month, in the middle of the normal range.   5. Hyperlipidemia: The patient's most recent lipid studies are worse after discontinuing Crestor. We'll se how she how she does with Zetia. 6. Goiter: Thyroid gland seems smaller. 7. Dyspepsia: Certain foods seemed to provoke her symptoms. 8. Thyroiditis: Hashimoto's disease is clinically quiescent. 9. Tobacco: I have again asked her to stop smoking.  10. Right hemiparesis: We discussed the fact that her hand and arm strength are better than the strength of her leg and foot.  She is continuing to improve physically and mentally over time. 11. Galactorrhea: Needs further evaluation. Not due to hypothyroidism.  PLAN: 1. Diagnostic: Prolactin today. TFTs and lipid panel prior to next visit. 2. Therapeutic: Continue Zetia and Synthroid at current doses.  3. Patient education: We discussed the possible linkage between weight and her supraclavicular swelling. We need to investigate her galactorrhea more.  4. Follow-up: Followup visit in 4 months.   Level of Service: This visit lasted in excess of 40 minutes. More than 50% of the visit was devoted to counseling.  David Stall

## 2011-05-23 LAB — PROLACTIN: Prolactin: 6.9 ng/mL

## 2011-08-01 ENCOUNTER — Other Ambulatory Visit: Payer: Self-pay | Admitting: *Deleted

## 2011-08-01 DIAGNOSIS — E039 Hypothyroidism, unspecified: Secondary | ICD-10-CM

## 2011-08-30 ENCOUNTER — Other Ambulatory Visit: Payer: Self-pay | Admitting: *Deleted

## 2011-08-30 DIAGNOSIS — E038 Other specified hypothyroidism: Secondary | ICD-10-CM

## 2011-09-15 ENCOUNTER — Other Ambulatory Visit: Payer: Self-pay | Admitting: Nurse Practitioner

## 2011-09-15 DIAGNOSIS — Z1231 Encounter for screening mammogram for malignant neoplasm of breast: Secondary | ICD-10-CM

## 2011-09-25 ENCOUNTER — Ambulatory Visit: Payer: BC Managed Care – PPO | Admitting: "Endocrinology

## 2011-09-27 ENCOUNTER — Encounter: Payer: Self-pay | Admitting: "Endocrinology

## 2011-09-27 ENCOUNTER — Ambulatory Visit (INDEPENDENT_AMBULATORY_CARE_PROVIDER_SITE_OTHER): Payer: BC Managed Care – PPO | Admitting: "Endocrinology

## 2011-09-27 VITALS — BP 107/66 | HR 74 | Wt 189.6 lb

## 2011-09-27 DIAGNOSIS — K3189 Other diseases of stomach and duodenum: Secondary | ICD-10-CM

## 2011-09-27 DIAGNOSIS — E063 Autoimmune thyroiditis: Secondary | ICD-10-CM

## 2011-09-27 DIAGNOSIS — R1013 Epigastric pain: Secondary | ICD-10-CM

## 2011-09-27 DIAGNOSIS — E049 Nontoxic goiter, unspecified: Secondary | ICD-10-CM

## 2011-09-27 DIAGNOSIS — R600 Localized edema: Secondary | ICD-10-CM

## 2011-09-27 DIAGNOSIS — Z720 Tobacco use: Secondary | ICD-10-CM

## 2011-09-27 DIAGNOSIS — E669 Obesity, unspecified: Secondary | ICD-10-CM

## 2011-09-27 DIAGNOSIS — R222 Localized swelling, mass and lump, trunk: Secondary | ICD-10-CM

## 2011-09-27 DIAGNOSIS — E038 Other specified hypothyroidism: Secondary | ICD-10-CM

## 2011-09-27 DIAGNOSIS — R609 Edema, unspecified: Secondary | ICD-10-CM

## 2011-09-27 DIAGNOSIS — F17200 Nicotine dependence, unspecified, uncomplicated: Secondary | ICD-10-CM

## 2011-09-27 DIAGNOSIS — E7800 Pure hypercholesterolemia, unspecified: Secondary | ICD-10-CM

## 2011-09-27 MED ORDER — OMEPRAZOLE 20 MG PO CPDR: DELAYED_RELEASE_CAPSULE | ORAL | Status: DC

## 2011-09-27 NOTE — Progress Notes (Signed)
CC: Follow-up hypothyroid, hypertension, goiter, dyspepsia, fatigue, GERD, Hashimoto's disease, status post CVA, right hemiparesis, hyperlipidemia, smoking, and head/neck masses    History of present illness: The patient is a 51 year old Caucasian woman.   1. The patient was hyperthyroid due to Graves' disease in 2008. She was treated with radioactive iodine and became hypothyroid. She's also had one or more prior CVAs, which have resulted in some mild right hemiparesis and perhaps some memory losses. She also had a 3-4 month episode of galactorrhea in 2010 which subsequently resolved spontaneously. On 08/02/2010 she presented with increased swelling of both supraclavicular areas. A CT scan was performed which showed no significant pathology. She was evaluated by Dr. Christia Reading of [facility]. Dr. Jenne Pane could not identify any cause of the supraclavicular area swelling. That swelling has since waxed and waned.  significantly. The patient was started on Crestor for hyperlipidemia, but could not tolerate even the 5 mg dose every other day because of severe muscle cramps. She was subsequently started on Zetia, 10 mg/day, which she has tolerated well.  2. The patient's last [facility] visit was on 05/22/11. She has been essentially healthy since that visit.  3. Pertinent Review of Systems: Constitutional: She has plenty of energy some days, but if she "wears herself out", she then is tired. She no longer has a problem with dry mouth and throat. When she tries to swallow it sometimes feels as if she's got something caught in her throat. She is able to swallow coated pills much better. Eyes: Vision is good. She had a recent eye exam. Her eye doctor tells her that she has signs of prior strokes in her eyes. Her cataracts are stable. She has the beginnings of macular degeneration.  Neck: She has a lot of postnasal drainage, which sometimes produces a sensation of difficulty swallowing. She has problems swallowing dry  things. Ketchup triggers throat spasm.She has no other complaints of anterior neck swelling, soreness, tenderness,  pressure, discomfort, or difficulty swallowing. The swelling of the supraclavicular fossae comes and goes. Swelling has improved recently.     Heart: Reportedly does not have any problems. Heart rate increases with exercise or other physical activity. The patient has no complaints of palpitations, irregular heat beats, chest pain, or chest pressure. Gastrointestinal: Heartburn varies with food. She'd like to take Nexium bid.    Legs: Muscle mass and strength seem unchanged. Her right side remains somewhat weaker than her left. She still has sciatic symptoms episodically. She notes some edema of the legs at times.  Feet: She is regaining feeling in both feet. She notes some edema at times. She develops dragging of her right foot  after about 30 minutes of walking.  Mental/psychological: She feels she is doing better. "I'm not eating checkers. I think I'm doing good." Her memory and abilities to think, to pay attention, and to make decisions seem better. Her depression improved after her husband left her.    REVIEW OF SYSTEMS: There are no other significant problems involving her other body systems.  PAST MEDICAL, FAMILY, AND SOCIAL HISTORY:  1.Family:  Her husband left in March. She now lives alone. She is much more social now and interacts more with friends and family. 2. Activities: She goes to the pool 1-2 times per week. She also walks on the treadmill for 30 minutes or walks at the park doing walking and sitting for several hours.  3. Tobacco: She just started Chantix in order to stop smoking. Chantix give her dreams  that involve prior TV content.  4. Primary care provider: Dr. Ninfa Linden, [facility], office [phone], fax [phone]. She had a recent PE. Dr. Jarold Motto was concerned about her recent cholesterol levels.  PHYSICAL EXAM: BP 107/66  Pulse 74   Wt 189 lb 9.6 oz (86.002 kg) She has gained one lb since last visit. Constitutional: The patient looks healthier than usual and appears physically and emotionally fairly well. She laughs and cracks jokes. She is really very mentally sharp today, perhaps the best that she's been in years.  Eyes: There is no arcus or proptosis.  Mouth: The oropharynx appears normal. The tongue appears normal, except for tobacco stains. There is normal oral moisture. There is no obvious gingivitis. Neck: There are no bruits present. The thyroid gland appears normal in size. The thyroid gland is approximately 20 grams in size. The consistency of the thyroid gland is normal. There is no thyroid tenderness to palpation. There is some supraclavicular fullness bilaterally that appears all due to adiposity.  Lungs: The lungs are clear. Air movement is good. Heart: The heart rhythm and rate appear normal. Heart sounds S1 and S2 are normal. I do not appreciate any pathologic heart murmurs. Abdomen: The abdominal size is enlarged. Bowel sounds are normal. The abdomen is soft and non-tender. There is no obviously palpable hepatomegaly, splenomegaly, or other masses.  Arms: Muscle mass appears appropriate for age.  Hands: There is no obvious tremor. Phalangeal and metacarpophalangeal joints appear normal. Palms are normal. Legs: Muscle mass appears appropriate for age. There is only trace edema.  Neurologic: Right hand grip is comparable to the left. Hip flexors are symmetric. Sensation to touch is normal in the legs and feet.  LABS:   09/06/11: TSH 0.663, free T4 1.65, T3 117, cholesterol 225, triglycerides 65, HDL 84, LDL 128  05/09/11: TSH 1.700. Free T4: 1.66. CMP is normal. Cholesterol 214, triglycerides 67, HDL was 73, LDL 128. 25-OH-vitamin D 41.7. She started Zetia as of  05/15/11.  ASSESSMENT: 1. Supraclavicular fullness: The swelling in the supraclavicular fossae improved with weight loss and recurred to a lesser degree  with weight gain.   2. Obesity: She has gained one lb. 3. Hypertension: BP is good today. 3. Hypothyroid: She is euthyroid this month. The fluctuation of the TSH, while there is no real fluctuation in T4, is c/w flare ups of Hashimoto's disease.  4. Hyperlipidemia: The patient's most recent HDL was better after increasing exercise and adding Zetia, but the LDLs are unchanged. Dr. Jarold Motto wanted to try weekly Crestor. I had suggested adding Trilipix to Zetia. Patient would prefer to take and pay for only one lipid drug. We discussed two studies that used Crestor once weekly or three times weekly. Since she could not tolerate 5 mg Crestor daily, it's unclear whether or not she will tolerate high-dose weekly Crestor therapy. We can start the Crestor and try to titrate the dose upward.   5. Goiter: Thyroid gland seems smaller. 6. Thyroiditis: Her hashimoto's disease is clinically quiescent, but the non-physiologic shifts in her TFTs are c/w intermittent Hashimoto's Dz activity. 7. Dyspepsia: Certain foods seemed to provoke her symptoms. She might benefit from doubling her Nexium. 8. Tobacco: She is using Chantix to try to stp smoking.   9. Edema: She sometimes takes in more salt than she needs.   PLAN: 1. Diagnostic: TFTs and lipid panel prior to next visit.  2. Therapeutic: Discontinue Zetia. Continue Synthroid at current dose. Increase omeprazole to 20 mg, twice  daily. Trial of once weekly Crestor: First month, 5 mg/week x 4 weeks. If she tolerates the Crestor, increase dose to 10 mg/week for 4 weeks. If she tolerates the 10 mg dose, increase by 5 mg/week per month until tolerance limit is reached or she is on a dose of 80 mg/week. Call me at the end of each month to discuss the dose.  3. Patient education: We discussed the possible linkage between weight and her supraclavicular swelling.  4. Follow-up: Followup visit in 4 months.  Level of Service: This visit lasted in excess of 700 minutes. More  than 50% of the visit was devoted to counseling.  David Stall

## 2011-09-27 NOTE — Patient Instructions (Signed)
Follow-up visit in 4 months. Discontinue Zetia. Increase omeprazole to twice daily at meals. Trial of Crestor:  Begin with one 5 mg tablet per week for 4 weeks. If tolerating that dose, increase to 10 mg/week for 4 weeks. If tolerating that dose, increase by 5 mg/week per month until either a dose of 80 mg/week is achieved or patient is no longer able to tolerate the medication at that dosage. Call Dr. Fransico Xin Klawitter at the end of each month to discuss whether or not to advance.

## 2011-10-12 DIAGNOSIS — R109 Unspecified abdominal pain: Secondary | ICD-10-CM

## 2011-10-18 ENCOUNTER — Ambulatory Visit: Payer: BC Managed Care – PPO

## 2011-11-27 ENCOUNTER — Encounter: Payer: Self-pay | Admitting: Cardiology

## 2011-11-27 DIAGNOSIS — R413 Other amnesia: Secondary | ICD-10-CM | POA: Insufficient documentation

## 2011-11-27 DIAGNOSIS — R42 Dizziness and giddiness: Secondary | ICD-10-CM | POA: Insufficient documentation

## 2011-11-27 DIAGNOSIS — I639 Cerebral infarction, unspecified: Secondary | ICD-10-CM | POA: Insufficient documentation

## 2011-11-27 DIAGNOSIS — N209 Urinary calculus, unspecified: Secondary | ICD-10-CM | POA: Insufficient documentation

## 2011-11-27 DIAGNOSIS — E079 Disorder of thyroid, unspecified: Secondary | ICD-10-CM | POA: Insufficient documentation

## 2011-11-27 DIAGNOSIS — N643 Galactorrhea not associated with childbirth: Secondary | ICD-10-CM | POA: Insufficient documentation

## 2011-11-27 DIAGNOSIS — I10 Essential (primary) hypertension: Secondary | ICD-10-CM | POA: Insufficient documentation

## 2011-11-27 DIAGNOSIS — E063 Autoimmune thyroiditis: Secondary | ICD-10-CM | POA: Insufficient documentation

## 2011-11-27 DIAGNOSIS — G8191 Hemiplegia, unspecified affecting right dominant side: Secondary | ICD-10-CM | POA: Insufficient documentation

## 2011-11-27 DIAGNOSIS — K219 Gastro-esophageal reflux disease without esophagitis: Secondary | ICD-10-CM | POA: Insufficient documentation

## 2011-11-27 DIAGNOSIS — R9430 Abnormal result of cardiovascular function study, unspecified: Secondary | ICD-10-CM | POA: Insufficient documentation

## 2011-11-27 DIAGNOSIS — H539 Unspecified visual disturbance: Secondary | ICD-10-CM | POA: Insufficient documentation

## 2011-11-27 DIAGNOSIS — Z888 Allergy status to other drugs, medicaments and biological substances status: Secondary | ICD-10-CM | POA: Insufficient documentation

## 2011-11-27 DIAGNOSIS — Q211 Atrial septal defect: Secondary | ICD-10-CM | POA: Insufficient documentation

## 2011-11-27 DIAGNOSIS — G43909 Migraine, unspecified, not intractable, without status migrainosus: Secondary | ICD-10-CM | POA: Insufficient documentation

## 2011-11-30 ENCOUNTER — Encounter: Payer: Self-pay | Admitting: Cardiology

## 2011-11-30 ENCOUNTER — Ambulatory Visit (INDEPENDENT_AMBULATORY_CARE_PROVIDER_SITE_OTHER): Payer: Medicare Other | Admitting: Cardiology

## 2011-11-30 VITALS — BP 140/89 | HR 85 | Ht 63.0 in | Wt 184.0 lb

## 2011-11-30 DIAGNOSIS — I10 Essential (primary) hypertension: Secondary | ICD-10-CM

## 2011-11-30 DIAGNOSIS — K5792 Diverticulitis of intestine, part unspecified, without perforation or abscess without bleeding: Secondary | ICD-10-CM

## 2011-11-30 DIAGNOSIS — I6350 Cerebral infarction due to unspecified occlusion or stenosis of unspecified cerebral artery: Secondary | ICD-10-CM

## 2011-11-30 DIAGNOSIS — I639 Cerebral infarction, unspecified: Secondary | ICD-10-CM

## 2011-11-30 DIAGNOSIS — Q211 Atrial septal defect: Secondary | ICD-10-CM

## 2011-11-30 DIAGNOSIS — K5732 Diverticulitis of large intestine without perforation or abscess without bleeding: Secondary | ICD-10-CM

## 2011-11-30 NOTE — Assessment & Plan Note (Signed)
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 She had right-sided weakness and this is improved. She received a closure device for her PFO. She refused at that time to consider Coumadin.

## 2011-11-30 NOTE — Patient Instructions (Addendum)

## 2011-11-30 NOTE — Assessment & Plan Note (Signed)
The patient tells me about her ongoing symptoms of diverticular disease. I have encouraged her to contact her primary physician very soon. She may need more advanced workup and treatment.

## 2011-11-30 NOTE — Assessment & Plan Note (Signed)
The patient's foramen ovale was closed with a occluder device. There is slight tilting of the device was no shunt. She will be seen again in followup at [facility] in December.

## 2011-11-30 NOTE — Progress Notes (Signed)
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 The patient has seen Dr. Andee Lineman in the past. I made it clear that she understands that he may be seeing patients on an outpatient basis at the hospital in the near future. If she would like to see him we will help in any way we can. Today she is seen for a one-year followup. She is stable. She had a cryptogenic stroke in the past. She had a patent foramen ovale. She did not want to take Coumadin. She received a occluder device. It is my understanding that it is tilted in some way. It is stable in and is followed at [facility]. She will be seen back there in December.  Overall she's doing well. She has some hypertension. She has good left ventricular function.  As part of today's evaluation I have carefully reviewed all of her records. I have updated the chart, although it is already in excellent shape.  Allergies  Allergen Reactions  . Crestor (Rosuvastatin Calcium) Other (See Comments)    Muscle cramps.  . Cymbalta (Duloxetine Hcl) Diarrhea  . Doxycycline     REACTION: Facial numbness, Nausea  . Metronidazole     REACTION: "blood vessels come up to top of skin"    Current Outpatient Prescriptions  Medication Sig Dispense Refill  . aspirin EC 81 MG tablet Take 81 mg by mouth daily.      . carvedilol (COREG) 12.5 MG tablet Take 12.5 mg by mouth 2 (two) times daily.        . cetirizine (ZYRTEC) 10 MG tablet Take 10 mg by mouth daily.      . Cholecalciferol (VITAMIN D) 2000 UNITS tablet Take 2,000 Units by mouth 2 (two) times daily.       . fish oil-omega-3 fatty acids 1000 MG capsule Take 1 g by mouth daily.      . methocarbamol (ROBAXIN) 500 MG tablet Take 500 mg by mouth every 8 (eight) hours as needed.       Marland Kitchen omeprazole (PRILOSEC) 20 MG capsule Take one capsule twice daily.  180 capsule  3  . rosuvastatin (CRESTOR) 5 MG tablet Take 5 mg by mouth once a week.       Marland Kitchen SYNTHROID 100 MCG  tablet Take 1 tablet (100 mcg total) by mouth daily.  90 tablet  4    History   Social History  . Marital Status: Married    Spouse Name: N/A    Number of Children: N/A  . Years of Education: N/A   Occupational History  . Public relations account executive    Social History Main Topics  . Smoking status: Current Every Day Smoker -- 1.0 packs/day for 35 years    Types: Cigarettes  . Smokeless tobacco: Never Used  . Alcohol Use: Yes     Rarely  . Drug Use: No  . Sexually Active: Not on file   Other Topics Concern  . 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 She lived and he handled [place]. Her primary care provider was Dr. Frederica Kuster [address], [address], [place] [address]. Phone numbers was [phone]. Fax was [phone].    Family History  Problem Relation Age of Onset  . Stroke Other   . Hypertension Other   . Diabetes Mother   . Diabetes Father   .  Thyroid disease Maternal Aunt   . Diabetes Maternal Grandmother   . Diabetes Paternal Grandmother   . Cancer Paternal Grandmother     Past Medical History  Diagnosis Date  . Galactorrhea syndrome   . Urinary calculus   . Thyroid disease     Hypothyroidism, post-radiation  . Vertigo   . Fatigue   . Hypertension   . GERD (gastroesophageal reflux disease)   . Stroke     Silent right brain stroke/cryptogenic stroke  . Patent foramen ovale     Status post device closure  at [facility]  25 mmGore Helix occluder device  . Migraine headache     Versus TIAs  . Visual changes   . Allergy to Coumadin     Not an allergy but pt declines Coumadin  . Thyroiditis, autoimmune   . Hypothyroidism, acquired, autoimmune   . Hemiparesis, right   . Memory problem   . Tobacco abuse   . Ejection fraction     EF normal by bedside echo per Dr. Andee Lineman, office visit, July, 2012    Past Surgical History  Procedure Date  . Total abdominal  hysterectomy 1997    No bso  . I-131 treatment 05/25/2006  . Tonsillectomy and adenoidectomy     ROS   Patient denies fever, chills, headache, sweats, rash, change in vision, change in hearing, chest pain, cough, nausea vomiting, urinary symptoms. She is having some diarrhea as outlined above. All other systems are reviewed and are negative.  PHYSICAL EXAM   Patient is stable. She is oriented to person time and place. Affect is normal. There is no jugulovenous distention. Lungs are clear. Respiratory effort is nonlabored. Cardiac exam reveals S1 and S2. There no clicks or significant murmurs. The abdomen is soft. There is no peripheral edema. There are no musculoskeletal deformities. There are no skin rashes.  Filed Vitals:   11/30/11 1302  BP: 140/89  Pulse: 85  Height: 5\' 3"  (1.6 m)  Weight: 184 lb (83.462 kg)   EKG is not done today. She recently had one at the hospital for an episode of diverticulitis. I have reviewed that tracing. There are minor nonspecific ST-T wave changes. There is normal sinus rhythm.  ASSESSMENT & PLAN

## 2011-11-30 NOTE — Assessment & Plan Note (Signed)
Blood pressure is controlled. No change in therapy. 

## 2012-01-02 ENCOUNTER — Other Ambulatory Visit: Payer: Self-pay | Admitting: Unknown Physician Specialty

## 2012-01-25 ENCOUNTER — Other Ambulatory Visit: Payer: Self-pay | Admitting: *Deleted

## 2012-01-25 DIAGNOSIS — Z1231 Encounter for screening mammogram for malignant neoplasm of breast: Secondary | ICD-10-CM

## 2012-01-25 DIAGNOSIS — E038 Other specified hypothyroidism: Secondary | ICD-10-CM

## 2012-02-20 ENCOUNTER — Other Ambulatory Visit: Payer: Self-pay | Admitting: "Endocrinology

## 2012-02-22 ENCOUNTER — Encounter: Payer: Self-pay | Admitting: "Endocrinology

## 2012-02-22 ENCOUNTER — Other Ambulatory Visit: Payer: Self-pay | Admitting: *Deleted

## 2012-02-22 ENCOUNTER — Ambulatory Visit (INDEPENDENT_AMBULATORY_CARE_PROVIDER_SITE_OTHER): Payer: BC Managed Care – PPO | Admitting: "Endocrinology

## 2012-02-22 VITALS — BP 116/64 | HR 87 | Wt 180.4 lb

## 2012-02-22 DIAGNOSIS — E049 Nontoxic goiter, unspecified: Secondary | ICD-10-CM

## 2012-02-22 DIAGNOSIS — E785 Hyperlipidemia, unspecified: Secondary | ICD-10-CM

## 2012-02-22 DIAGNOSIS — K3189 Other diseases of stomach and duodenum: Secondary | ICD-10-CM

## 2012-02-22 DIAGNOSIS — E039 Hypothyroidism, unspecified: Secondary | ICD-10-CM

## 2012-02-22 DIAGNOSIS — E063 Autoimmune thyroiditis: Secondary | ICD-10-CM

## 2012-02-22 DIAGNOSIS — R1013 Epigastric pain: Secondary | ICD-10-CM

## 2012-02-22 DIAGNOSIS — E7800 Pure hypercholesterolemia, unspecified: Secondary | ICD-10-CM

## 2012-02-22 DIAGNOSIS — I10 Essential (primary) hypertension: Secondary | ICD-10-CM

## 2012-02-22 DIAGNOSIS — E669 Obesity, unspecified: Secondary | ICD-10-CM

## 2012-02-22 DIAGNOSIS — R609 Edema, unspecified: Secondary | ICD-10-CM

## 2012-02-22 MED ORDER — ROSUVASTATIN CALCIUM 5 MG PO TABS: 5.0000 mg | ORAL_TABLET | ORAL | Status: DC

## 2012-02-22 NOTE — Progress Notes (Signed)
CC: Follow-up hypothyroid, hypertension, goiter, dyspepsia, fatigue, GERD, Hashimoto's disease, status post CVA, right hemiparesis, hyperlipidemia, smoking, and head/neck masses    History of present illness: The patient is a 51 year old Caucasian woman.   1. The patient was hyperthyroid due to Graves' disease in 2008. She was treated with radioactive iodine and became hypothyroid. She's also had one or more prior CVAs, which have resulted in some mild right hemiparesis and perhaps some memory losses. She also had a 3-4 month episode of galactorrhea in 2010 which subsequently resolved spontaneously. On 08/02/2010 she presented with increased swelling of both supraclavicular areas. A CT scan was performed which showed no significant pathology. She was evaluated by Dr. Christia Reading of [facility]. Dr. Jenne Pane could not identify any cause of the supraclavicular area swelling. That swelling has since waxed and waned.  significantly. The patient was started on Crestor for hyperlipidemia, but could not tolerate even the 5 mg dose more than twice per week. She was subsequently started on Zetia, 10 mg/day, which she did not tolerate well.  2. The patient's last [facility] visit was on 09/27/11. She has had two episodes of diverticulitis. The antibiotics threw her into C.difficile, causing a longer course of antibiotics. She ran out of Crestor about two weeks ago, about one week before her recent labs were drawn. Although her muscle cramps are worse when she takes Crestor, she still has the cramps when she is off Crestor.  3. Pertinent Review of Systems: Constitutional: She has feels "all right". "I ain't got much energy. I have to get myself up in order to go." The cold weather makes all of her muscle and joint symptoms worse. Eyes: Near vision is not as good. She has to use her bifocals more.  Neck: She has a lot of postnasal drainage, which sometimes produces a sensation of difficulty swallowing. She has occasional  problems swallowing. She has no other complaints of anterior neck swelling, soreness, tenderness,  pressure, discomfort, or difficulty swallowing. The swelling of the supraclavicular fossae has improved.     Heart: She occasionally has chest wall pains at the left lateral costochondral junctions. Heart rate increases with exercise or other physical activity. The patient has no other complaints of palpitations, irregular heat beats, chest pain, or chest pressure. Gastrointestinal: Heartburn is a lot better. Spicy foods really bother her. She is often constipated.  Legs: Muscle mass and strength seem unchanged. Her right side remains somewhat weaker than her left. She still has sciatic symptoms episodically. She notes some edema of the legs at times.  Feet: She sometimes has burning in her feet, usually one foot or the other. She notes some edema at times. She still has dragging of her right foot after about 30 minutes of  exercise.   Neuro: She has persistent numbness if her thumbs and first two fingers bilaterally. Mental/psychological: She feels she is doing better. "I'm not eating checkers. I think I'm doing good." Her memory and abilities to think, to pay attention, and to make decisions seem better. Her depression resolved after her husband left her.    REVIEW OF SYSTEMS: There are no other significant problems involving her other body systems.  PAST MEDICAL, FAMILY, AND SOCIAL HISTORY:  1.Family:  Her husband left in March. She now lives alone. She is much more social now and interacts more with friends and family. 2. Activities: She has not gone to the pool in 6 months due to her GI problems. She occasionally walks on the treadmill for  5-10 minutes.  3. Tobacco: She stopped Chantix after about one week due to her diverticulitis. She is still smoking.   4. Primary care provider: NP Ninfa Linden, [facility], office [phone], fax [phone].   PHYSICAL EXAM: BP 116/64   Pulse 87  Wt 180 lb 6.4 oz (81.829 kg) She lost 9 lbs since last visit.  Constitutional: The patient looks healthier than usual and appears physically and emotionally fairly well. She laughs and cracks jokes. She is really very mentally sharp today.  Eyes: There is no arcus or proptosis.  Mouth: The oropharynx appears normal. The tongue appears normal, except for tobacco stains. There is normal oral moisture. There is no obvious gingivitis. Neck: There are no bruits present. The thyroid gland appears normal in size. The thyroid gland is approximately 20 grams in size. The consistency of the thyroid gland is normal. There is no thyroid tenderness to palpation. There is very mild supraclavicular fullness bilaterally that appears due to adiposity.  Lungs: The lungs are clear. Air movement is good. Heart: The heart rhythm and rate appear normal. Heart sounds S1 and S2 are normal. I do not appreciate any pathologic heart murmurs. Abdomen: The abdomen is enlarged. Bowel sounds are normal. The abdomen is soft and non-tender. There is no obviously palpable hepatomegaly, splenomegaly, or other masses.  Arms: Muscle mass appears appropriate for age.  Hands: There is no obvious tremor. Phalangeal and metacarpophalangeal joints appear normal. Palms are normal. Legs: Muscle mass appears appropriate for age. There is no edema today.  Neurologic: Right hand grip is comparable to the left. Hip flexors are symmetric. Sensation to touch is normal in the legs.  LABS:  02/13/12: TSH 0.235, free T4 1.83, cholesterol 236, triglycerides 85, HDL 68, LDL 151. Had been back on Crestor for about 3 weeks, but was off it for about 7 days prior to the labs being drawn.  01/09/12: AST 45, ALT 70, cholesterol 298, triglycerides 85, HDL 72, LDL 209, TSH 0.44, free T4 1.52, 25-hydroxy,  vitamin D 41. Had been off Crestor for months.  09/06/11: TSH 0.663, free T4 1.65, T3 117, cholesterol 225, triglycerides 65, HDL 84, LDL  128 05/09/11: TSH 1.700. Free T4: 1.66. CMP is normal. Cholesterol 214, triglycerides 67, HDL was 73, LDL 128. 25-OH-vitamin D 41.7. She started Zetia as of  05/15/11.  ASSESSMENT: 1. Supraclavicular fullness: The swelling in the supraclavicular fossae improved greatly with weight loss and recurred to a lesser degree with weight gain.   2. Obesity: She has lost weight due to all of her GI problems in the past several months.  3. Hypertension: BP is good today. 3. Hypothyroid: She is mildly hyperthyroid again on Synthroid, 100 mcg/day. The fluctuation of the TSH could be due to  flare ups of Hashimoto's disease. Alternatively, when her liver is mildly inflamed she may not metabolize Synthroid as well.  4. Hyperlipidemia: The patient's most recent total cholesterol and LDL were better after starting Crestor twice a week. Had she been taking Crestor at the time the last labs were drawn, however, these lipid values might have been lower. She tolerates semiweekly Crestor or weekly Crestor much better than daily Crestor.  5. Goiter: Thyroid gland is within normal size today.  6. Thyroiditis: Her hashimoto's disease is clinically quiescent, but the non-physiologic shifts in her TFTs are c/w intermittent Hashimoto's Dz activity. 7. Dyspepsia: Her dyspepsia is much better after taking omeprazole twice daily. 8. Tobacco: She will try Chantix again to try to stp  smoking.   9. Edema: She sometimes takes in more salt than she needs.   PLAN: 1. Diagnostic: TFTs and lipid panel prior to next visit.  2. Therapeutic: Continue Synthroid 100 mcg pills 6 days per week, but take only 1/2 pill on sundays. Continue omeprazole, 20 mg, twice daily. Resume weekly Crestor: First month, 5 mg/week x 4 weeks. If she tolerates the Crestor, increase dose to 5 mg, twice weekly.  3. Patient education: We discussed the possible linkages between weight and her supraclavicular swelling and between liver inflammation and Synthroid  metabolism. 4. Follow-up: Followup visit in 4 months.  Level of Service: This visit lasted in excess of 700 minutes. More than 50% of the visit was devoted to counseling.  David Stall

## 2012-02-22 NOTE — Patient Instructions (Signed)
Follow up visit in 4 months. Reduce Synthroid to 100 mcg/day 6 days per week, but take only 1/2 pill on Sundays. Resume Crestor. For the first 4 weeks, take only one 5 mg pill per week. If you tolerate that dose, increase to 5 mg, twice weekly.

## 2012-02-23 ENCOUNTER — Telehealth: Payer: Self-pay | Admitting: *Deleted

## 2012-02-23 NOTE — Telephone Encounter (Signed)
Received fax from [employer] in [place], [place], [phone] requesting confirmation of the Sig: on her RX for Rouvastatin (Crestor) 5mg .  Sig: Take 1 tablet by mouth once a week.  Left a Voice Mail on Dr's Line that the Sig. Is correct.

## 2012-02-25 DIAGNOSIS — E7800 Pure hypercholesterolemia, unspecified: Secondary | ICD-10-CM | POA: Insufficient documentation

## 2012-03-26 ENCOUNTER — Other Ambulatory Visit: Payer: Self-pay | Admitting: *Deleted

## 2012-03-26 DIAGNOSIS — E785 Hyperlipidemia, unspecified: Secondary | ICD-10-CM

## 2012-03-26 MED ORDER — ROSUVASTATIN CALCIUM 5 MG PO TABS: 5.0000 mg | ORAL_TABLET | Freq: Every day | ORAL | Status: DC

## 2012-04-15 ENCOUNTER — Other Ambulatory Visit: Payer: Self-pay | Admitting: *Deleted

## 2012-04-15 DIAGNOSIS — E038 Other specified hypothyroidism: Secondary | ICD-10-CM

## 2012-04-15 MED ORDER — SYNTHROID 100 MCG PO TABS: ORAL_TABLET | ORAL | Status: DC

## 2012-05-27 ENCOUNTER — Other Ambulatory Visit: Payer: Self-pay | Admitting: *Deleted

## 2012-05-27 DIAGNOSIS — E039 Hypothyroidism, unspecified: Secondary | ICD-10-CM

## 2012-06-24 ENCOUNTER — Encounter: Payer: Self-pay | Admitting: Pediatrics

## 2012-06-24 ENCOUNTER — Encounter: Payer: Self-pay | Admitting: *Deleted

## 2012-06-24 ENCOUNTER — Encounter: Payer: Self-pay | Admitting: "Endocrinology

## 2012-06-24 ENCOUNTER — Ambulatory Visit (INDEPENDENT_AMBULATORY_CARE_PROVIDER_SITE_OTHER): Payer: Medicare Other | Admitting: "Endocrinology

## 2012-06-24 VITALS — BP 136/72 | HR 71 | Wt 179.3 lb

## 2012-06-24 DIAGNOSIS — E7800 Pure hypercholesterolemia, unspecified: Secondary | ICD-10-CM

## 2012-06-24 DIAGNOSIS — E669 Obesity, unspecified: Secondary | ICD-10-CM

## 2012-06-24 DIAGNOSIS — R609 Edema, unspecified: Secondary | ICD-10-CM

## 2012-06-24 DIAGNOSIS — E049 Nontoxic goiter, unspecified: Secondary | ICD-10-CM

## 2012-06-24 DIAGNOSIS — I10 Essential (primary) hypertension: Secondary | ICD-10-CM

## 2012-06-24 DIAGNOSIS — R1013 Epigastric pain: Secondary | ICD-10-CM

## 2012-06-24 DIAGNOSIS — E038 Other specified hypothyroidism: Secondary | ICD-10-CM

## 2012-06-24 DIAGNOSIS — E063 Autoimmune thyroiditis: Secondary | ICD-10-CM

## 2012-06-24 DIAGNOSIS — K3189 Other diseases of stomach and duodenum: Secondary | ICD-10-CM

## 2012-06-24 NOTE — Patient Instructions (Signed)
Follow up visit in 4 months. Please have lab tests done one week prior to visit.

## 2012-06-24 NOTE — Progress Notes (Signed)
CC: Follow-up hypothyroid, hypertension, goiter, dyspepsia, fatigue, GERD, Hashimoto's disease, status post CVA, right hemiparesis, hyperlipidemia, smoking, and supraclavicular fullness    History of present illness: [name] [name] is a 52 year old Caucasian woman.   1. [name] [name] was hyperthyroid due to Graves' disease in 2008. She was treated with radioactive iodine and became hypothyroid. She's also had one or more prior CVAs, which have resulted in some mild right hemiparesis and perhaps some memory losses. She also had a 3-4 month episode of galactorrhea in 2010 which subsequently resolved spontaneously. On 08/02/2010 she presented with increased swelling of both supraclavicular areas. A CT scan was performed which showed no significant pathology. She was evaluated by Dr. Christia Reading of [name]. Dr. Jenne Pane could not identify any cause of [name] supraclavicular area swelling. That swelling has since waxed and waned with her weight.  [name] [name] was started on Crestor for hyperlipidemia, but could not tolerate even [name] 5 mg dose more than twice per week. She was subsequently started on Zetia, 10 mg/day, which she did not tolerate well.   2. [name] [name]'s last [facility] visit was on 02/23/12. In [name] interim she had a flu-like illness in February that "put me on my butt for three weeks". She remains on Crestor, 5 mg, twice per week and Synthroid 100 mcg/day 6 days per week and 1/2 pill on Sundays. She resumed smoking. She has been eating more bread lately and has desserts on Saturday night when she goes to bingo.  3. Pertinent Review of Systems: Constitutional: She has feels "all right, but tired all [name] time". When she tries to exercise it takes her 3-4 days to recover. She sometimes has severe sweating.  Eyes: Near vision is not as good. She has to use her bifocals more.  Neck: She has a lot of postnasal drainage during allergy season. She has occasional problems swallowing when her allergies are worse.  She has no other complaints of anterior neck swelling, soreness, tenderness,  pressure, discomfort, or difficulty swallowing. [name] swelling of [name] supraclavicular fossae has improved.     Heart: Heart rate increases with exercise or other physical activity. [name] [name] has no other complaints of palpitations, irregular heat beats, chest pain, or chest pressure. Gastrointestinal: Heartburn is a lot better overall. Spicy foods, however, really bother her, causing dyspepsia and GERD. She is often constipated.  Legs: Muscle mass and strength seem unchanged. Her right side remains somewhat weaker than her left. She has not had much sciatica recently. She notes some burning of her calves at times. She also has some edema of [name] lower calves and ankles at times.  Feet: She sometimes has burning in her feet, usually one foot or [name] other. She notes some edema at times. She still has dragging of her right foot after about 30 minutes of  exercise.   Neuro: She has persistent numbness in her thumbs and first two fingers bilaterally. She also has some burning numbness of her nose at times.  Mental/psychological: She feels "stressed out" by [name] divorce proceedings. They will go to court in June. Her memory and abilities to think, to pay attention, and to make decisions seem better. Her depression resolved after her husband left her.    REVIEW OF SYSTEMS: There are no other significant problems involving her other body systems.  PAST MEDICAL, FAMILY, AND SOCIAL HISTORY:  1. Family: Her husband left her in March. She now lives alone. She is fairly social now and interacts a lot with friends and  family. 2. Activities: She has resumed going to [name] pool. She occasionally walks on [name] treadmill for 5-10 minutes.  3. Tobacco: She is still smoking.   4. Primary care provider: NP Ninfa Linden, [facility], office [phone], fax [phone].   PHYSICAL EXAM: BP 136/72  Pulse 71  Wt 179 lb 4.8 oz  (81.33 kg)  BMI 31.77 kg/m2 She lost 1 lb since last visit.  Constitutional: [name] [name] looks healthier than usual and appears physically and emotionally fairly well. She again laughs and cracks jokes. She is really quite mentally sharp today.  Eyes: There is no arcus or proptosis.  Mouth: [name] oropharynx appears normal. [name] tongue appears normal. There is normal oral moisture. There is no obvious gingivitis. Neck: There are no bruits present. [name] thyroid gland appears normal in size. [name] thyroid gland is approximately 20 grams in size. [name] consistency of [name] thyroid gland is normal. There is no thyroid tenderness to palpation. There is very mild supraclavicular fullness bilaterally that appears to be due to adiposity.  Lungs: [name] lungs are clear. Air movement is good. Heart: [name] heart rhythm and rate appear normal. Heart sounds S1 and S2 are normal. I do not appreciate any pathologic heart murmurs. Abdomen: [name] abdomen is enlarged. Bowel sounds are normal. [name] abdomen is soft and non-tender. There is no obviously palpable hepatomegaly, splenomegaly, or other masses.  Arms: Muscle mass appears appropriate for age.  Hands: There is no obvious tremor. Phalangeal and metacarpophalangeal joints appear normal. Palms are normal. Legs: Muscle mass appears appropriate for age. There is no edema today.  Neurologic: Right hand grip is comparable to [name] left. Hip flexors are symmetric. Sensation to touch is normal in [name] legs.  LABS:  06/18/12: TSH 1.260, free T4 1.58, free T3 2.5, cholesterol 211, triglycerides 71, HDL 78, LDL 119 02/13/12: TSH 0.235, free T4 1.83, cholesterol 236, triglycerides 85, HDL 68, LDL 151. Had been back on Crestor for about 3 weeks, but was off it for about 7 days prior to [name] labs being drawn.  01/09/12: AST 45, ALT 70, cholesterol 298, triglycerides 85, HDL 72, LDL 209, TSH 0.44, free T4 1.52, 25-hydroxy,  vitamin D 41. Had been off Crestor for months.  09/06/11: TSH 0.663, free T4  1.65, T3 117, cholesterol 225, triglycerides 65, HDL 84, LDL 128 05/09/11: TSH 1.700. Free T4: 1.66. CMP is normal. Cholesterol 214, triglycerides 67, HDL was 73, LDL 128. 25-OH-vitamin D 41.7. She started Zetia as of  05/15/11.  ASSESSMENT: 1. Supraclavicular fullness: [name] swelling in [name] supraclavicular fossae improved greatly with weight loss and recurred to a lesser degree with weight gain.   2. Obesity: She had lost weight due to all of her GI problems prior to her last visit and has lost another pound since then. She had not been exercising for a long time, but has recently begun to exercise again.  3. Hypertension: BP is higher today. She needs daily exercise.  3. Hypothyroid: She is euthyroid today on her current dose of Synthroid.   4. Hyperlipidemia: [name] [name]'s most recent total cholesterol and LDL are better on Crestor, [name] lowest they have been in [name] past year.. If she also exercises and is more careful with her diet, her lipids will improve even more. We will give her another 4 months before trying to increase [name] Crestor dose.  5. Goiter: Thyroid gland is within normal size today.  6. Thyroiditis: Hashimoto's disease is clinically quiescent, but [name] non-physiologic shifts in her TFTs  are c/w intermittent Hashimoto's Dz activity. 7. Dyspepsia: Her dyspepsia is much better after taking omeprazole twice daily. 8. Tobacco: She will try Chantix again to try to stop smoking after her divorce is finalized.   9. Edema: She does not have any edema today. She sometimes takes in more salt than she needs.   PLAN: 1. Diagnostic: TFTs, CMP, and lipid panel prior to next visit.  2. Therapeutic: Continue Synthroid 100 mcg pills 6 days per week, but only 1/2 pill on Sundays. Continue omeprazole, 20 mg, twice daily. Continue twice weekly Crestor.   3. [name] education: We discussed [name] possible linkages between weight and her supraclavicular swelling and between liver inflammation and Synthroid  metabolism. 4. Follow-up: Follow up visit in 4 months.  Level of Service: This visit lasted in excess of 55 minutes. More than 50% of [name] visit was devoted to counseling.  David Stall

## 2012-06-25 DIAGNOSIS — E049 Nontoxic goiter, unspecified: Secondary | ICD-10-CM | POA: Insufficient documentation

## 2012-06-25 DIAGNOSIS — E063 Autoimmune thyroiditis: Secondary | ICD-10-CM | POA: Insufficient documentation

## 2012-10-04 ENCOUNTER — Other Ambulatory Visit: Payer: Self-pay | Admitting: *Deleted

## 2012-10-04 DIAGNOSIS — E038 Other specified hypothyroidism: Secondary | ICD-10-CM

## 2012-10-24 ENCOUNTER — Ambulatory Visit (INDEPENDENT_AMBULATORY_CARE_PROVIDER_SITE_OTHER): Payer: Medicare HMO | Admitting: "Endocrinology

## 2012-10-24 ENCOUNTER — Encounter: Payer: Self-pay | Admitting: "Endocrinology

## 2012-10-24 VITALS — BP 111/68 | HR 80 | Wt 185.5 lb

## 2012-10-24 DIAGNOSIS — K3189 Other diseases of stomach and duodenum: Secondary | ICD-10-CM

## 2012-10-24 DIAGNOSIS — R1013 Epigastric pain: Secondary | ICD-10-CM

## 2012-10-24 DIAGNOSIS — F17200 Nicotine dependence, unspecified, uncomplicated: Secondary | ICD-10-CM

## 2012-10-24 DIAGNOSIS — I10 Essential (primary) hypertension: Secondary | ICD-10-CM

## 2012-10-24 DIAGNOSIS — E7800 Pure hypercholesterolemia, unspecified: Secondary | ICD-10-CM

## 2012-10-24 DIAGNOSIS — E038 Other specified hypothyroidism: Secondary | ICD-10-CM

## 2012-10-24 DIAGNOSIS — R609 Edema, unspecified: Secondary | ICD-10-CM

## 2012-10-24 DIAGNOSIS — IMO0001 Reserved for inherently not codable concepts without codable children: Secondary | ICD-10-CM

## 2012-10-24 DIAGNOSIS — E063 Autoimmune thyroiditis: Secondary | ICD-10-CM

## 2012-10-24 DIAGNOSIS — E669 Obesity, unspecified: Secondary | ICD-10-CM

## 2012-10-24 NOTE — Patient Instructions (Signed)
Follow up visit in 4 months. Ear Right Diet. Consider getting referred to a lung doctor for evaluation of asthma/COPD.

## 2012-10-24 NOTE — Progress Notes (Signed)
CC: Follow-up hypothyroid s/p I-131, hypertension, goiter, dyspepsia, fatigue, GERD, Hashimoto's disease, status post CVA, right hemiparesis, hyperlipidemia, smoking, and supraclavicular fullness    History of present illness: The patient is a 52 year old Caucasian woman.   1. The patient was hyperthyroid due to Graves' disease in 2008. She was treated with radioactive iodine and became hypothyroid. She's also had one or more prior CVAs, which have resulted in some mild right hemiparesis and perhaps some memory losses. She also had a 3-4 month episode of galactorrhea in 2010 which subsequently resolved spontaneously. On 08/02/2010 she presented with increased swelling of both supraclavicular areas. A CT scan was performed which showed no significant pathology. She was evaluated by Dr. Christia Reading of [facility]. Dr. Jenne Pane could not identify any cause of the supraclavicular area swelling. That swelling has since waxed and waned with her weight.  The patient was started on Crestor for hyperlipidemia, but initially could not tolerate even the 5 mg dose more than twice per week. She was subsequently started on Zetia, 10 mg/day, which she did not tolerate well.   2. The patient's last [facility] visit was on 06/24/12. In the interim she had at least one more TIA. Her neurologist took her off ASA and started her on Plavix. She has felt "fine" since then, except for her usual aches and pains. She remains on Crestor, 5 mg, three per week, omeprazole, 20 mg/day, and Synthroid 100 mcg/day 6 days per week and 1/2 pill on Sundays. She is still smoking. She still loves to eat.   3. Pertinent Review of Systems: Constitutional: She has feels "tired a lot". When she tries to exercise it takes her 3-4 days to recover. She sometimes has severe sweating.  Eyes: Near vision is not as good. She has to use her bifocals more.  Neck: She has a lot of postnasal drainage during allergy season. She has occasional problems swallowing  when her allergies are worse. Her food sometimes gets stuck in her mid-neck. She has no other complaints of anterior neck swelling, soreness, tenderness,  pressure, discomfort, or difficulty swallowing. The swelling of the supraclavicular fossae has stayed the same.      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 She gets short-winded very easily. The patient has no other complaints of palpitations, irregular heat beats, chest pain, or chest pressure. Gastrointestinal: Her GERD and heartburn are a lot worse overall. Spicy foods and soft drinks hurt her even more. She has diarrhea more often than constipation.  Legs: Muscle mass and strength seem unchanged. Her right side remains somewhat weaker than her left. She has not had much sciatica recently. She notes some burning of her calves at times. She also has some edema of the lower calves and ankles at times.  Feet: She sometimes has numbness and burning in her feet. She notes some edema at times. She still has dragging of her right foot after about 30 minutes of  exercise.   Neuro: She has persistent numbness in her thumbs and first two fingers bilaterally. She also has some burning numbness of her nose at times.  Mental/psychological: She went to court last week to finalize her divorce. Her memory and abilities to think, to pay attention, and to make decisions seem better. Her depression resolved after her husband left her.    REVIEW OF SYSTEMS: There are no other significant problems involving her other body systems.  PAST MEDICAL, FAMILY, AND SOCIAL  HISTORY:  1. Family: Her husband left her in March. She now lives alone. She is fairly social now and interacts a lot with friends and family. 2. Activities: She has not been to the pool recently, but has gone to the lake several times. She occasionally walks on the treadmill for 5-10 minutes.  3. Tobacco: She is still smoking.   4. Primary care provider:  Her NP Ninfa Linden, is no longer at the [facility], office [phone], fax [phone]. She has a new provider, but can't remember her name. 5. Neurologist: Dr. Lacey Jensen, [facility], phone [phone]  PHYSICAL EXAM: BP 111/68  Pulse 80  Wt 185 lb 8 oz (84.142 kg)  BMI 32.87 kg/m2 She has gained 6 pounds since last visit.   Constitutional: The patient looks pretty good today. Her insight and affect seem pretty normal. Her memory may not be quite as good as one year ago. She is quite mentally sharp today.  Eyes: There is no arcus or proptosis.  Mouth: The oropharynx appears normal. The tongue appears normal. There is normal oral moisture. There is no obvious gingivitis. Neck: There are no bruits present. The thyroid gland appears normal in size. The thyroid gland is approximately 20 grams in size. The consistency of the thyroid gland is normal. There is no thyroid tenderness to palpation. There is very mild supraclavicular fullness bilaterally that appears to be due to adiposity.  Lungs: The lungs are clear. Air movement is good. Heart: The heart rhythm and rate appear normal. Heart sounds S1 and S2 are normal. I do not appreciate any pathologic heart murmurs. Abdomen: The abdomen is enlarged. Bowel sounds are normal. The abdomen is soft and non-tender. There is no obviously palpable hepatomegaly, splenomegaly, or other masses.  Arms: Muscle mass appears appropriate for age.  Hands: There is no obvious tremor. Phalangeal and metacarpophalangeal joints appear normal. Palms are normal. Legs: Muscle mass appears appropriate for age. There is no edema today.  Neurologic: Right hand grip is comparable to the left. Hip flexors are symmetric. Sensation to touch is decreased in both legs and in the fingers of both hands.    LABS:  10/16/12: CMP normal; TSH 2.20, free T4 1.10, free T3 2.2; cholesterol 202, triglycerides 77, HDL 82, LDL 105 06/18/12: TSH 1.260,  free T4 1.58, free T3 2.5, cholesterol 211, triglycerides 71, HDL 78, LDL 119 02/13/12: TSH 0.235, free T4 1.83, cholesterol 236, triglycerides 85, HDL 68, LDL 151. Had been back on Crestor for about 3 weeks, but was off it for about 7 days prior to the labs being drawn.  01/09/12: AST 45, ALT 70, cholesterol 298, triglycerides 85, HDL 72, LDL 209, TSH 0.44, free T4 1.52, 25-hydroxy,  vitamin D 41. Had been off Crestor for months.  09/06/11: TSH 0.663, free T4 1.65, T3 117, cholesterol 225, triglycerides 65, HDL 84, LDL 128 05/09/11: TSH 1.700. Free T4: 1.66. CMP is normal. Cholesterol 214, triglycerides 67, HDL was 73, LDL 128. 25-OH-vitamin D 41.7. She started Zetia as of  05/15/11.  ASSESSMENT: 1. Supraclavicular fullness: The swelling in the supraclavicular fossae had improved greatly with weight loss, but recurred to a lesser degree with weight gain.   2. Obesity: She had lost weight due to all of her GI problems 8 months ago, but has gained weight progressively in the past 4 months. She has not been exercising as much, partly due to feeling short of breath.   3. Hypertension: BP is better today. She still  needs daily exercise.  3. Hypothyroid: She is euthyroid this month on her current dose of Synthroid.   4. Hyperlipidemia: The patient's most recent total cholesterol and LDL are much better on Crestor, the lowest they have been in the past year. If she also exercises and is more careful with her diet, her lipids will improve even more. She has a lot of muscle pains, which may or may not be due to Crestor. She does not want to increase the Crestor dose.  5. Goiter: Thyroid gland is within normal size today.  6. Thyroiditis: Hashimoto's disease is clinically quiescent, but the non-physiologic shifts in her TFTs at times are c/w intermittent Hashimoto's Dz activity. 7. Dyspepsia: Her dyspepsia is worse again. She needs to stop smoking and take in fewer carbs.  8. Tobacco: At this point in time she is  not motivated to stop smoking. We will continue to encourage her. 9. Edema: She does not have any edema today. She sometimes takes in more salt than she needs.   PLAN: 1. Diagnostic: TFTs, CMP, and lipid panel prior to next visit.  2. Therapeutic: Continue Synthroid 100 mcg pills 6 days per week, but only 1/2 pill on Sundays. Continue omeprazole, 20 mg, twice daily. Continue twice weekly Crestor.   3. Patient education: We discussed the possible linkages between weight and her supraclavicular swelling and between liver inflammation and Synthroid metabolism. 4. Follow-up: Follow up visit in 4 months.  Level of Service: This visit lasted in excess of 55 minutes. More than 50% of the visit was devoted to counseling.  David Stall

## 2013-01-10 ENCOUNTER — Other Ambulatory Visit: Payer: Self-pay | Admitting: Unknown Physician Specialty

## 2013-01-12 LAB — STOOL CULTURE

## 2013-01-17 ENCOUNTER — Ambulatory Visit: Payer: Self-pay | Admitting: Gastroenterology

## 2013-02-11 ENCOUNTER — Other Ambulatory Visit: Payer: Self-pay | Admitting: *Deleted

## 2013-02-11 DIAGNOSIS — E038 Other specified hypothyroidism: Secondary | ICD-10-CM

## 2013-03-10 ENCOUNTER — Encounter: Payer: Self-pay | Admitting: "Endocrinology

## 2013-03-10 ENCOUNTER — Ambulatory Visit (INDEPENDENT_AMBULATORY_CARE_PROVIDER_SITE_OTHER): Payer: Medicare HMO | Admitting: "Endocrinology

## 2013-03-10 VITALS — BP 116/68 | HR 76 | Wt 184.0 lb

## 2013-03-10 DIAGNOSIS — E049 Nontoxic goiter, unspecified: Secondary | ICD-10-CM

## 2013-03-10 DIAGNOSIS — I10 Essential (primary) hypertension: Secondary | ICD-10-CM

## 2013-03-10 DIAGNOSIS — R1013 Epigastric pain: Secondary | ICD-10-CM

## 2013-03-10 DIAGNOSIS — E669 Obesity, unspecified: Secondary | ICD-10-CM

## 2013-03-10 DIAGNOSIS — E890 Postprocedural hypothyroidism: Secondary | ICD-10-CM

## 2013-03-10 DIAGNOSIS — E7800 Pure hypercholesterolemia, unspecified: Secondary | ICD-10-CM

## 2013-03-10 DIAGNOSIS — K3189 Other diseases of stomach and duodenum: Secondary | ICD-10-CM

## 2013-03-10 LAB — COMPREHENSIVE METABOLIC PANEL
ALT: 28 U/L (ref 0–35)
AST: 18 U/L (ref 0–37)
Albumin: 4.2 g/dL (ref 3.5–5.2)
Alkaline Phosphatase: 85 U/L (ref 39–117)
BUN: 12 mg/dL (ref 6–23)
CO2: 29 mEq/L (ref 19–32)
Calcium: 9.8 mg/dL (ref 8.4–10.5)
Chloride: 104 mEq/L (ref 96–112)
Creat: 0.8 mg/dL (ref 0.50–1.10)
Glucose, Bld: 87 mg/dL (ref 70–99)
Potassium: 4.8 mEq/L (ref 3.5–5.3)
Sodium: 141 mEq/L (ref 135–145)
Total Bilirubin: 0.5 mg/dL (ref 0.3–1.2)
Total Protein: 6.8 g/dL (ref 6.0–8.3)

## 2013-03-10 LAB — LIPID PANEL
Cholesterol: 194 mg/dL (ref 0–200)
HDL: 72 mg/dL (ref >39)
LDL Cholesterol: 106 mg/dL — ABNORMAL HIGH (ref 0–99)
Total CHOL/HDL Ratio: 2.7 Ratio
Triglycerides: 82 mg/dL (ref <150)
VLDL: 16 mg/dL (ref 0–40)

## 2013-03-10 NOTE — Progress Notes (Signed)
CC: Follow-up hypothyroid s/p I-131, hypertension, goiter, dyspepsia, fatigue, GERD, Hashimoto's disease, status post CVA, right hemiparesis, hyperlipidemia, smoking, and supraclavicular fullness    History of present illness: The patient is a 53 year old Caucasian woman.   1. The patient was hyperthyroid due to Graves' disease in 2008. She was treated with radioactive iodine and became hypothyroid. She's also had one or more prior CVAs, which have resulted in some mild right hemiparesis and perhaps some memory losses. She also had a 3-4 month episode of galactorrhea in 2010 which subsequently resolved spontaneously. On 08/02/2010 she presented with increased swelling of both supraclavicular areas. A CT scan was performed which showed no significant pathology. She was evaluated by Dr. Melida Quitter of [facility]. Dr. Redmond Baseman could not identify any cause of the supraclavicular area swelling other than obesity. That swelling has since waxed and waned with her changes in weight.  The patient was started on Crestor for hyperlipidemia, but initially could not tolerate even the 5 mg dose more than twice per week. She was subsequently started on Zetia, 10 mg/day, which she tolerated better, but did not control her lipids as well.   2. The patient's last [facility] visit was on 10/24/12. In the interim she has not had any further TIAs. She developed a URI over Xmas and is just recovering now. The OTC meds she took for the URI caused worse hypertension. In October and November she had more problems with abdominal pains and frequent bowel movements/diarrhea. Lab tests on 01/08/13 showed trace hematuria. CBC was normal. CMP, lipase, and amylase were normal. Lab tests on 01/16/13 showed an essentially normal CBC and ESR of 9 (normal 0-30). She reportedly also had lab tests for celiac disease, but the lab tests were normal. [place] on 03/19/12 showed a fatty liver. She also saw an NP for a GI doc at the [facility], but no  specific diagnosis was made. The abdominal pains have resolved. She continues to have frequent stools and bloating, but not as severely. She takes Plavix, 75 mg/day; Synthroid, 100 mcg/day, 6 days per week and 50 mcg on Sundays; Crestor, 5 mg/day, for only three days per week due to leg pains; and omeprazole, 20 mg, only daily, unlike twice a day as I had prescribed. Her PA reduced the dose to once a day because of concerns that she should not be taking so much omeprazole for so long. She is still smoking. She still loves to eat.   3. Pertinent Review of Systems: Constitutional: She feels "pretty good, still somewhat tired, but bothered by her frequent stools. She no longer has severe sweating.  Eyes: Near vision is not as good. She has to use her bifocals more.  Neck: She has a lot of postnasal drainage during allergy season and when around pets. She has occasional problems swallowing when her allergies are worse. Her food sometimes gets stuck in her mid-neck. She has no other complaints of anterior neck swelling, soreness, tenderness,  pressure, discomfort, or difficulty swallowing. The swelling of the supraclavicular fossae has stayed the same.      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 She gets short-winded very easily. The patient has no other complaints of palpitations, irregular heat beats, chest pain, or chest pressure. Gastrointestinal: As above. Her GERD and heartburn are worse since reducing the omeprazole. Spicy foods and soft drinks hurt her even more. She has diarrhea more often than constipation.  Legs:  Muscle mass and strength seem unchanged. Her right side remains somewhat weaker than her left. She has not had much sciatica recently. She noted fewer leg pains when she was temporarily off Crestor. She notes some burning of her calves at times. She also has some edema of the lower calves and ankles at times.  Feet: She sometimes  has numbness and burning in her feet. She notes some edema at times. She rarely drags her right foot anymore.  Neuro: She has persistent numbness in her thumbs and first two fingers bilaterally. She also has some burning numbness of her nose at times.  Mental/psychological: "I'm doing all right." Her memory and abilities to think, to pay attention, and to make decisions seem better. Her depression resolved after her husband left her. Her divorce is final.   REVIEW OF SYSTEMS: There are no other significant problems involving her other body systems.  PAST MEDICAL, FAMILY, AND SOCIAL HISTORY:  1. Family: She now lives alone. She is fairly social now and interacts a lot with friends and family. Sometimes she and her sister encourage each other to eat too much of the wrong things. 2. Activities: She has not been to the pool recently.  She occasionally walks on the treadmill for 5-10 minutes.  3. Tobacco: She is still smoking.   4. Primary care provider: She wants to return to see Dr. Raynelle Jan. 5. Neurologist: Dr. Wendi Snipes, [facility], phone [phone]  PHYSICAL EXAM: BP 116/68  Pulse 76  Wt 184 lb (83.462 kg) She has gained 4 pounds since last visit.   Constitutional: The patient looks pretty good today. Her insight and affect seem pretty normal. Her memory seems a lot better than it did one year ago. She is quite mentally sharp today. Her ironic sense of humor has returned.  Eyes: There is no arcus or proptosis.  Mouth: The oropharynx appears normal. The tongue appears normal. There is normal oral moisture. There is no obvious gingivitis. Neck: There are no bruits present. The thyroid gland appears normal in size. The thyroid gland is approximately 20 grams in size. The left lobe of th thyroid gland is slightly more prominent than the right lobe. The consistency of the thyroid gland is normal. There is no thyroid tenderness to palpation. There is very mild supraclavicular  fullness bilaterally that appears to be due to adiposity.  Lungs: The lungs are clear. Air movement is good. Heart: The heart rhythm and rate appear normal. Heart sounds S1 and S2 are normal. I do not appreciate any pathologic heart murmurs. Abdomen: The abdomen is enlarged. Bowel sounds are normal. The abdomen is soft and non-tender. There is no obviously palpable hepatomegaly, splenomegaly, or other masses.  Arms: Muscle mass appears appropriate for age.  Hands: She has 1+ tremor bilaterally. Phalangeal and metacarpophalangeal joints appear normal. Palms are normal. Legs: Muscle mass appears appropriate for age. There is no edema today.  Neurologic: Right hand grip is comparable to the left. Right hip flexor is significantly weaker than the left hip flexors. Sensation to touch is decreased in both legs and in the fingers of both hands.    LABS:  03/10/13: Pending 10/16/12: CMP normal; TSH 2.20, free T4 1.10, free T3 2.2; cholesterol 202, triglycerides 77, HDL 82, LDL 105 06/18/12: TSH 1.260, free T4 1.58, free T3 2.5, cholesterol 211, triglycerides 71, HDL 78, LDL 119 02/13/12: TSH 0.235, free T4 1.83, cholesterol 236, triglycerides 85, HDL 68, LDL 151. Had been back on Crestor for about  3 weeks, but was off it for about 7 days prior to the labs being drawn.  01/09/12: AST 45, ALT 70, cholesterol 298, triglycerides 85, HDL 72, LDL 209, TSH 0.44, free T4 1.52, 25-hydroxy,  vitamin D 41. Had been off Crestor for months.  09/06/11: TSH 0.663, free T4 1.65, T3 117, cholesterol 225, triglycerides 65, HDL 84, LDL 128 05/09/11: TSH 1.700. Free T4: 1.66. CMP is normal. Cholesterol 214, triglycerides 67, HDL was 73, LDL 128. 25-OH-vitamin D 41.7. She started Zetia as of  05/15/11.  ASSESSMENT: 1. Supraclavicular fullness: The swelling in the supraclavicular fossae had improved greatly with weight loss, but recurred to a lesser degree with weight gain.   2. Obesity: She had lost weight due to all of her GI  problems 13 months ago, but has gained weight progressively in the past 8 months. She has not been exercising as much, partly due to feeling short of breath.   3. Hypertension: BP is better today. She still needs daily exercise.  3. Hypothyroid, s/p I-131: She was euthyroid in August on her current dose of Synthroid.   4. Hyperlipidemia: The patient's most recent total cholesterol and LDL are much better on Crestor, the lowest they have been in the past year. If she also exercises and is more careful with her diet, her lipids will improve even more. She has a lot of muscle pains, which may or may not be due to Crestor. She does not want to increase the Crestor dose.  5. Goiter: Thyroid gland is within normal size today.  6. Thyroiditis: Hashimoto's disease is clinically quiescent, but the non-physiologic shifts in her TFTs at times are c/w intermittent Hashimoto's Dz activity. 7. Dyspepsia/GERD: Her dyspepsia and GERD are worse again since reducing the omeprazole dose. I do not see any contraindication to her taking 20 mg of omeprazole twice daily. She is also having frequent stools, but less often recently.She needs to stop smoking and take in fewer carbs.  8. Tobacco: At this point in time she is not motivated to stop smoking. We will continue to encourage her. 9. Edema: She does not have any edema today. She sometimes takes in more salt than she needs.   PLAN: 1. Diagnostic: TFTs, CMP, and lipid panel prior to next visit.  2. Therapeutic: Continue Synthroid 100 mcg pills 6 days per week, but only 1/2 pill on Sundays. Continue omeprazole, 20 mg, twice daily. Continue twice weekly Crestor.   3. Patient education: We discussed the possible linkages between weight and her supraclavicular swelling and between liver inflammation and Synthroid metabolism. 4. Follow-up: Follow up visit in 4 months.  Level of Service: This visit lasted in excess of 55 minutes. More than 50% of the visit was devoted to  counseling.  [name] [name]

## 2013-03-10 NOTE — Patient Instructions (Addendum)
Follow up visit in 4 months. Eat right and exercise daily.

## 2013-03-11 LAB — T4, FREE: Free T4: 1.36 ng/dL (ref 0.80–1.80)

## 2013-03-11 LAB — T3, FREE: T3, Free: 2.6 pg/mL (ref 2.3–4.2)

## 2013-03-11 LAB — TSH: TSH: 1.456 u[IU]/mL (ref 0.350–4.500)

## 2013-03-13 ENCOUNTER — Encounter: Payer: Self-pay | Admitting: *Deleted

## 2013-05-29 ENCOUNTER — Other Ambulatory Visit: Payer: Self-pay | Admitting: "Endocrinology

## 2013-06-23 ENCOUNTER — Other Ambulatory Visit: Payer: Self-pay | Admitting: *Deleted

## 2013-06-23 DIAGNOSIS — E038 Other specified hypothyroidism: Secondary | ICD-10-CM

## 2013-06-26 ENCOUNTER — Telehealth: Payer: Self-pay | Admitting: *Deleted

## 2013-06-26 NOTE — Telephone Encounter (Signed)
LVM to call back to re-schedule appointment from May7th, due to provider out of office. [name] [name]

## 2013-07-10 ENCOUNTER — Ambulatory Visit: Payer: Medicare HMO | Admitting: "Endocrinology

## 2013-07-11 ENCOUNTER — Ambulatory Visit (INDEPENDENT_AMBULATORY_CARE_PROVIDER_SITE_OTHER): Payer: Medicare HMO | Admitting: "Endocrinology

## 2013-07-11 ENCOUNTER — Encounter: Payer: Self-pay | Admitting: "Endocrinology

## 2013-07-11 VITALS — BP 115/68 | HR 76 | Wt 178.6 lb

## 2013-07-11 DIAGNOSIS — E049 Nontoxic goiter, unspecified: Secondary | ICD-10-CM

## 2013-07-11 DIAGNOSIS — K3189 Other diseases of stomach and duodenum: Secondary | ICD-10-CM

## 2013-07-11 DIAGNOSIS — R1013 Epigastric pain: Secondary | ICD-10-CM

## 2013-07-11 DIAGNOSIS — R197 Diarrhea, unspecified: Secondary | ICD-10-CM

## 2013-07-11 DIAGNOSIS — E069 Thyroiditis, unspecified: Secondary | ICD-10-CM

## 2013-07-11 DIAGNOSIS — E782 Mixed hyperlipidemia: Secondary | ICD-10-CM

## 2013-07-11 DIAGNOSIS — E890 Postprocedural hypothyroidism: Secondary | ICD-10-CM

## 2013-07-11 DIAGNOSIS — E669 Obesity, unspecified: Secondary | ICD-10-CM

## 2013-07-11 DIAGNOSIS — K219 Gastro-esophageal reflux disease without esophagitis: Secondary | ICD-10-CM

## 2013-07-11 DIAGNOSIS — F17200 Nicotine dependence, unspecified, uncomplicated: Secondary | ICD-10-CM

## 2013-07-11 DIAGNOSIS — I10 Essential (primary) hypertension: Secondary | ICD-10-CM

## 2013-07-11 DIAGNOSIS — J810 Acute pulmonary edema: Secondary | ICD-10-CM

## 2013-07-11 DIAGNOSIS — R222 Localized swelling, mass and lump, trunk: Secondary | ICD-10-CM

## 2013-07-11 NOTE — Progress Notes (Signed)
CC: Follow-up hypothyroid s/p I-131, hypertension, goiter, dyspepsia, fatigue, GERD, Hashimoto's disease, status post CVA, right hemiparesis, hyperlipidemia, smoking, and supraclavicular fullness    History of present illness: The patient is a 53 year old Caucasian woman.   1. The patient first presented to me on 08/10/04 in referral from her primary care provider, Dr. Frederica Kuster in [place], [place], for evaluation and management of hyperthyroidism. In retrospect, the patient had chronic problems with infected sinuses, inner ear symptoms, and spinning dizziness. She also had complaints of severe fatigue and weight gain. Dr. Jonette Eva had performed thyroid tests on 06/14/04. TSH was 0.13, free T4 1.04. Thyroid ultrasound showed small and large nodules. Thyroid gland was somewhat enlarged overall. On examination she had a 20+ gram thyroid gland. Thyroid gland was firm, but nontender. She also had some diastolic hypertension. Thyroid tests showed a TSH of 0.437, free T4 1.01, and free T3 of 2.6. Her TPO antibody level was 29 9. Her TSI level was 98. It appeared at that time that the patient likely had some elevation of thyroid hormone due to Hashimoto's disease or had a combination of intermittently active Hashimoto's Disease and intermittently active  Graves' Disease. During the next 2 years, her TSH values varied from 0.350-0.854. By February of 2008, however, her free her TSH dropped to 0.008, her free T4 had increased to 2.08, and her free T3 and increased to 5.8. She was clearly hyperthyroid and thyrotoxic. TSI level at that point was 1.8 with normal being 1.3 or less. On 05/25/06 she underwent radioactive iodine treatment with I-131. She subsequently became hypothyroid and was started on Synthroid replacement therapy. Her doses of Synthroid have been adjusted several times.   2. During the past 5 years the patient has had several other medical problems:  A. She had one or more prior CVAs/TIAs, which have  resulted in some mild right hemiparesis and perhaps some memory losses.Her MRI of the brain in February 2010 showed evidence for remote infarcts involving the posterior tight temporal lobe and parietal lobe.    B. She also had a 3-4 month episode of spontaneous galactorrhea in 2010. Brain MRI showed a 2 x 3 mm area of delayed enhancement seen posteriorly on the left side of her pituitary gland. I felt at the time that it would be very uncharacteristic for a true prolactinoma this small to actually produce hyperprolactinemia and clinical galactorrhea. The galactorrhea subsequently resolved spontaneously.   C. On 08/02/2010 she presented with increased swelling of both supraclavicular areas. A CT scan was performed which showed no significant pathology. She was evaluated by Dr. Christia Reading of [facility]. Dr. Jenne Pane could not identify any cause of the supraclavicular area swelling other than obesity. That swelling has since waxed and waned with her changes in weight.  The patient was started on Crestor for hyperlipidemia, but initially could not tolerate even the 5 mg dose more than twice per week. She was subsequently started on Zetia, 10 mg/day, which she tolerated better, but did not control her lipids as well. Crestor was later re-started at a lower dose and she was able to tolerate that medication.  3. The patient's last [facility] visit was on 03/10/13.   A. In the interim she has not had any further TIAs.   B. Last month she had an episode of anterior hemi-circumferential neck swelling that gave her the sensation of difficulty breathing. She was about to go to the ED when the symptoms began to abate. This episode was somewhat similar to  the episode she had in the Spring of 2012. For several days after the recent swelling went down she had several swollen lymph nodes in the anterior neck bilaterally. The lymphadenopathy then disappeared. She went to see her ENT in [place] 1-2 weeks later, but there was no  swelling at that time.    C. She has been having fairly persistent diarrhea. Her abdominal pains have continued as well. She reportedly also had lab tests for celiac disease, but the lab tests were normal. US on 03/19/12 showed a fatty liver. She also saw both an NP for a GI doctor and the GI doctor himself at [facility], but no specific diagnosis was made. She is due to have a colonoscopy later this month.  D. She continues to have frequent stools and bloating, but not as severely. She takes Plavix, 75 mg/day; Synthroid, 100 mcg/day, 6 days per week and 50 mcg on Sundays; Crestor, 5 mg/day, for only three days per week due to leg pains. She stopped omeprazole, 20 mg, only daily due to concerns that it might cause diarrhea. She has more heartburn and reflux now. She is still smoking. She still loves to eat.   3. Pertinent Review of Systems: Constitutional: She feels "all right, except for crapping my guts out".  Eyes: Near vision is not as good. She has to use her bifocals more.  Neck: She has a lot of postnasal drainage during allergy season and when around pets. She has occasional problems swallowing when her allergies are worse. Her food sometimes gets stuck in her mid-neck. She has no other complaints of anterior neck swelling, soreness, tenderness,  pressure, discomfort, or difficulty swallowing. The swelling of the supraclavicular fossae comes and goes.      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 She is due for follow up in September. She gets short-winded very easily. The patient has no other complaints of palpitations, irregular heat beats, chest pain, or chest pressure. Gastrointestinal: As above. Her GERD and heartburn are worse since stopping the omeprazole. Spicy foods and soft drinks hurt her even more. She has diarrhea more often than constipation.  Legs: Muscle mass and strength seem unchanged. Her right side remains  somewhat weaker than her left. She has had more sciatica recently. She notes some burning of her calves at times. She also has some edema of the lower calves and ankles at times.  Feet: She sometimes has numbness and burning in her feet. She notes some edema at times. Her right leg has become stronger over time, so she rarely drags her right foot anymore.  Neuro: She has persistent numbness in her thumbs and first two fingers bilaterally. She also has some burning numbness of her nose at times.  Mental/psychological: "I'm doing all right." Her memory and abilities to think, to pay attention, and to make decisions seem better. Her depression resolved after her husband left her.   REVIEW OF SYSTEMS: There are no other significant problems involving her other body systems.  PAST MEDICAL, FAMILY, AND SOCIAL HISTORY:  1. Family: She now lives alone. She is fairly social now and interacts a lot with friends and family. Sometimes she and her sister encourage each other to eat too much of the wrong things. 2. Activities: She has not been to the pool recently due to concerns about having diarrhea.  She occasionally walks.  3. Tobacco: She is still smoking.   4.  Primary care provider: She sees Dr. Jonette EvaLazo in [place]. Telephone [phone]. 5. Neurologist: Dr. Lacey JensenVictor Owusu, [facility], phone [phone]  PHYSICAL EXAM: BP 115/68  Pulse 76  Wt 178 lb 9.6 oz (81.012 kg) She has lost 6 pounds since last visit.   Constitutional: The patient looks pretty good today. Her ironic sense of humor is very good. Her insight and affect seem pretty normal. Her memory seems a lot better than it did one year ago. She is quite mentally sharp today.  Eyes: There is no arcus or proptosis.  Mouth: The oropharynx appears normal. The tongue appears normal. There is normal oral moisture. There is no obvious gingivitis. Neck: There are no bruits present. The thyroid gland appears normal in size. The thyroid  gland is approximately 18-20 grams in size. Both lobes are within normal size today. The consistency of the thyroid gland is normal. There is no thyroid tenderness to palpation. There is no supraclavicular fullness bilaterally today.  Lungs: The lungs are clear. Air movement is good. Heart: The heart rhythm and rate appear normal. Heart sounds S1 and S2 are normal. I do not appreciate any pathologic heart murmurs. Abdomen: The abdomen is enlarged. Bowel sounds are normal. The abdomen is soft and non-tender. There is no obviously palpable hepatomegaly, splenomegaly, or other masses.  Arms: Muscle mass appears appropriate for age.  Hands: She has trace-to-1+ tremor bilaterally. Phalangeal and metacarpophalangeal joints appear normal. Palms are normal. Legs: Muscle mass appears appropriate for age. There is no edema today.  Neurologic: Right hand grip is slightly weaker than the left handgrip. Right hip flexor is significantly weaker than the left hip flexors. Sensation to touch is intact in her legs today and feet.     LABS:   07/11/13: B12 236 (normal 211-911), calcitonin and B1 pending 03/10/13: CMP normal. TSH 1.456, free T4 1.36, free T3 2.6; cholesterol 194, triglycerides 82, HDL 72, LDL 106 10/16/12: CMP normal; TSH 2.20, free T4 1.10, free T3 2.2; cholesterol 202, triglycerides 77, HDL 82, LDL 105 06/18/12: TSH 1.260, free T4 1.58, free T3 2.5, cholesterol 211, triglycerides 71, HDL 78, LDL 119 02/13/12: TSH 0.235, free T4 1.83, cholesterol 236, triglycerides 85, HDL 68, LDL 151. Had been back on Crestor for about 3 weeks, but was off it for about 7 days prior to the labs being drawn.  01/09/12: AST 45, ALT 70, cholesterol 298, triglycerides 85, HDL 72, LDL 209, TSH 0.44, free T4 1.52, 25-hydroxy,  vitamin D 41. Had been off Crestor for months.  09/06/11: TSH 0.663, free T4 1.65, T3 117, cholesterol 225, triglycerides 65, HDL 84, LDL 128 05/09/11: TSH 1.700. Free T4: 1.66. CMP is normal. Cholesterol  214, triglycerides 67, HDL was 73, LDL 128. 25-OH-vitamin D 41.7. She started Zetia as of  05/15/11.  ASSESSMENT: 1. Supraclavicular fullness: The swelling in the supraclavicular fossae has resolved. I don't know what caused her swelling previously.   2. Obesity: She had lost weight due to all of her GI problems two years ago, but then gained weight progressively in the next 8 months, but has lost more weight in the past 4 months.    3. Hypertension: BP is good today. She still needs daily exercise.  3. Hypothyroid, s/p I-131: She was euthyroid in January 2015.    4. Hyperlipidemia: The patient's most recent total cholesterol and LDL were much better on Crestor, the lowest they have been in the past year. If she also exercises and is more careful with her  diet, her lipids will improve even more. She has some muscle pains, which may or may not be due to Crestor. She does not want to increase the Crestor dose.  5. Goiter: Thyroid gland is within normal size today.  6. Thyroiditis: Hashimoto's disease is clinically quiescent today. The non-physiologic shifts in her TFTs at times are c/w intermittent Hashimoto's Dz activity. 7. Dyspepsia/GERD: Her dyspepsia and GERD are worse again since stopping omeprazole. I do not see any contraindication to her taking 20 mg of omeprazole twice daily. She is also having more frequent stools. She needs to stop smoking and take in fewer carbs.  8. Tobacco: At this point in time she is not motivated to stop smoking. We will continue to encourage her. 9. Edema: She does not have any edema today. She sometimes takes in more salt than she needs.  10. Diarrhea: We need to rule out B1 deficiency, B12 deficiency,and hypercalcitoninemia.   PLAN: 1. Diagnostic: TFTs, CMP, and lipid panel, plus thiamine, B12, and calcitonin.  2. Therapeutic: Continue Synthroid 100 mcg pills 6 days per week, but only 1/2 pill on Sundays. Continue omeprazole, 20 mg, twice daily. Continue twice  weekly Crestor.   3. Patient education: We discussed the possible linkages between weight and her supraclavicular swelling and between liver inflammation and Synthroid metabolism. We also discussed possible deficiencies of vitamins B1 and B12.  4. Follow-up: Follow up visit in 4 months.  Level of Service: This visit lasted in excess of 55 minutes. More than 50% of the visit was devoted to counseling.  David Stall

## 2013-07-11 NOTE — Patient Instructions (Signed)
Follow up visit in 4 months.  

## 2013-07-12 LAB — VITAMIN B12: Vitamin B-12: 236 pg/mL (ref 211–911)

## 2013-07-13 DIAGNOSIS — R197 Diarrhea, unspecified: Secondary | ICD-10-CM | POA: Insufficient documentation

## 2013-07-13 DIAGNOSIS — R1013 Epigastric pain: Secondary | ICD-10-CM | POA: Insufficient documentation

## 2013-07-13 DIAGNOSIS — F17200 Nicotine dependence, unspecified, uncomplicated: Secondary | ICD-10-CM | POA: Insufficient documentation

## 2013-07-13 DIAGNOSIS — R222 Localized swelling, mass and lump, trunk: Secondary | ICD-10-CM | POA: Insufficient documentation

## 2013-07-15 LAB — CALCITONIN: Calcitonin: <2 pg/mL (ref <6)

## 2013-07-16 LAB — VITAMIN B1: VITAMIN B1 (THIAMINE): 9 nmol/L (ref 8–30)

## 2013-07-24 ENCOUNTER — Ambulatory Visit: Payer: Self-pay | Admitting: Unknown Physician Specialty

## 2013-07-29 LAB — PATHOLOGY REPORT

## 2013-11-18 ENCOUNTER — Other Ambulatory Visit: Payer: Self-pay | Admitting: *Deleted

## 2013-11-18 ENCOUNTER — Encounter: Payer: Self-pay | Admitting: "Endocrinology

## 2013-11-18 ENCOUNTER — Ambulatory Visit (INDEPENDENT_AMBULATORY_CARE_PROVIDER_SITE_OTHER): Payer: Medicare HMO | Admitting: "Endocrinology

## 2013-11-18 VITALS — BP 100/64 | HR 76 | Wt 168.0 lb

## 2013-11-18 DIAGNOSIS — E049 Nontoxic goiter, unspecified: Secondary | ICD-10-CM

## 2013-11-18 DIAGNOSIS — E669 Obesity, unspecified: Secondary | ICD-10-CM

## 2013-11-18 DIAGNOSIS — I10 Essential (primary) hypertension: Secondary | ICD-10-CM

## 2013-11-18 DIAGNOSIS — R609 Edema, unspecified: Secondary | ICD-10-CM

## 2013-11-18 DIAGNOSIS — E063 Autoimmune thyroiditis: Secondary | ICD-10-CM

## 2013-11-18 DIAGNOSIS — E038 Other specified hypothyroidism: Secondary | ICD-10-CM

## 2013-11-18 DIAGNOSIS — E782 Mixed hyperlipidemia: Secondary | ICD-10-CM

## 2013-11-18 DIAGNOSIS — K5289 Other specified noninfective gastroenteritis and colitis: Secondary | ICD-10-CM

## 2013-11-18 DIAGNOSIS — R1013 Epigastric pain: Secondary | ICD-10-CM

## 2013-11-18 DIAGNOSIS — K3189 Other diseases of stomach and duodenum: Secondary | ICD-10-CM

## 2013-11-18 NOTE — Progress Notes (Signed)
CC: Follow-up hypothyroid s/p I-131, hypertension, goiter, dyspepsia, fatigue, GERD, Hashimoto's disease, status post CVA, right hemiparesis, hyperlipidemia, smoking, and supraclavicular fullness    History of present illness: The patient is a 53 year old Caucasian woman.   1. The patient first presented to me on 08/10/04 in referral from her primary care provider, Dr. Frederica Kuster in [place], [place], for evaluation and management of hyperthyroidism. In retrospect, the patient had chronic problems with infected sinuses, inner ear symptoms, and spinning dizziness. She also had complaints of severe fatigue and weight gain. Dr. Jonette Eva had performed thyroid tests on 06/14/04. TSH was 0.13, free T4 1.04. Thyroid ultrasound showed small and large nodules. Thyroid gland was somewhat enlarged overall. On examination she had a 20+ gram thyroid gland. Thyroid gland was firm, but nontender. She also had some diastolic hypertension. Thyroid tests showed a TSH of 0.437, free T4 1.01, and free T3 of 2.6. Her TPO antibody level was 29 9. Her TSI level was 98. It appeared at that time that the patient likely had some elevation of thyroid hormone due to Hashimoto's disease or had a combination of intermittently active Hashimoto's Disease and intermittently active  Graves' Disease. During the next 2 years, her TSH values varied from 0.350-0.854. By February of 2008, however,her TSH dropped to 0.008, her free T4 had increased to 2.08, and her free T3 and increased to 5.8. She was clearly hyperthyroid and thyrotoxic. TSI level at that point was 1.8 with normal being 1.3 or less. On 05/25/06 she underwent radioactive iodine treatment with I-131. She subsequently became hypothyroid and was started on Synthroid replacement therapy. Her doses of Synthroid have been adjusted several times.   2. During the past 9 years the patient has had several other medical problems:  A. She had one or more prior CVAs/TIAs, which have resulted in some  mild right hemiparesis and perhaps some memory losses. Her MRI of the brain in February 2010 showed evidence for remote infarcts involving the posterior tight temporal lobe and parietal lobe.    B. She also had a 3-4 month episode of spontaneous galactorrhea in 2010. Brain MRI showed a 2 x 3 mm area of delayed enhancement seen posteriorly on the left side of her pituitary gland. I felt at the time that it would be very uncharacteristic for a true prolactinoma this small to actually produce hyperprolactinemia and clinical galactorrhea. The galactorrhea subsequently resolved spontaneously.   C. On 08/02/2010 she presented with increased swelling of both supraclavicular areas. A CT scan was performed which showed no significant pathology. She was evaluated by Dr. Christia Reading of [facility]. Dr. Jenne Pane could not identify any cause of the supraclavicular area swelling other than obesity. That swelling has since waxed and waned with her changes in weight.  The patient was started on Crestor for hyperlipidemia, but initially could not tolerate even the 5 mg dose more than twice per week. She was subsequently started on Zetia, 10 mg/day, which she tolerated better, but did not control her lipids as well. Crestor was later re-started at a lower dose and she was able to tolerate that medication.  3. The patient's last [facility] visit was on 07/11/13.   A. In the interim she has not had any further TIAs or anterior hemi-circumferential neck swelling that gave her the sensation of difficulty breathing.   B. She had a colonoscopy two months ago. A polyp was removed. Some inflammation was noted. She was started on a gluten-free and casein-free diet.   C. She  is still having some diarrhea, but much less than before. Her abdominal pains have fairly well resolved on this new diet.   D. She takes Plavix, 75 mg/day; Synthroid, 100 mcg/day, 6 days per week and 50 mcg on Sundays. She stopped Crestor due to pains all over her body.  She takes omeprazole, 20 mg, only when she plans to eat out. She has less heartburn and reflux now on her current diet. She is still smoking. She still loves to eat.   3. Pertinent Review of Systems: Constitutional: She feels "pretty good, I guess". Some days she feels more tired than on other days. When she feels good she tends to overdo activities. .  Eyes: Near vision is not as good. She has to use her bifocals much more.  Neck: She has a lot of postnasal drainage during allergy season and when around pets. She has occasional problems swallowing when her allergies are worse. Her food sometimes gets stuck in her mid-neck.  She has no other complaints of anterior neck swelling, soreness, tenderness, pressure, discomfort, or difficulty swallowing. The swelling of the supraclavicular fossae has not been a problem recently.       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 Her cardiologist confirmed that diagnosis at her follow up appointment in August. She is scheduled for annual follow up. The patient has no other complaints of irregular heat beats, chest pain, or chest pressure. Gastrointestinal: As above. Her GERD and heartburn are better on intermittent omeprazole. Spicy foods and soft drinks hurt her even more. She has diarrhea more often than constipation.  Legs: Muscle mass and strength seem unchanged. Her right side remains somewhat weaker than her left. She has had sciatica only rarely since last visit. She notes some burning of her calves at times. She also has some edema of the lower calves and ankles at times.  Feet: She sometimes has numbness and burning in her feet. She notes some edema at times. Her right leg has become stronger over time, so she rarely drags her right foot anymore.  Neuro: She has persistent numbness in her thumbs and first two fingers bilaterally. She also  has some burning numbness of her nose at times.  Mental/psychological: "I'm doing all right." Her memory and abilities to think, to pay attention, and to make decisions seem better.  REVIEW OF SYSTEMS: There are no other significant problems involving her other body systems.  PAST MEDICAL, FAMILY, AND SOCIAL HISTORY:  1. Family: She now lives alone. She is fairly social now and interacts a lot with friends and family. Sometimes she and her sister encourage each other to eat too much of the wrong things. She has two sisters with hypothyroidism. Her daughter is also having thyroid problems. 2. Activities: She has not been swimming or walking very much due to diarrhea.  3. Tobacco: She is still smoking.   4. Primary care provider: She sees Dr. Jonette Eva in [place]. Telephone [phone]. 5. Neurologist: Dr. Lacey Jensen, [facility], phone [phone]  PHYSICAL EXAM: BP 100/64  Pulse 76  Wt 168 lb (76.204 kg) She has lost 10 pounds since last visit.   Constitutional: The patient looks good today. Her ironic sense of humor is very good. Her insight and affect are normal. Her memory is much better than it was one year ago. She is quite mentally sharp today.  Eyes:  There is no arcus or proptosis.  Mouth: The oropharynx appears normal. The tongue appears normal. There is normal oral moisture. There is no obvious gingivitis. Neck: There are no bruits present. The thyroid gland appears normal in size. The thyroid gland is approximately 18-20 grams in size. Both lobes are within normal size today. The consistency of the thyroid gland is normal. There is no thyroid tenderness to palpation. There is no supraclavicular fullness bilaterally today.  Lungs: The lungs are clear. Air movement is good. Heart: The heart rhythm and rate appear normal. Heart sounds S1 and S2 are normal. I do not appreciate any pathologic heart murmurs. Abdomen: The abdomen is enlarged. Bowel sounds are normal. The  abdomen is soft and non-tender. There is no obviously palpable hepatomegaly, splenomegaly, or other masses.  Arms: Muscle mass appears appropriate for age.  Hands: She has trace-to-1+ tremor bilaterally. Phalangeal and metacarpophalangeal joints appear normal. Palms are normal. Legs: Muscle mass appears appropriate for age. There is no edema today.  Neurologic: Right hand grip is slightly weaker than the left handgrip. Right hip flexor is significantly weaker than the left hip flexors. Sensation to touch is intact in her legs today and feet.     LABS:   07/11/13: B12 236 (normal 211-911), calcitonin < 2 (normal < 6)  03/10/13: CMP normal. TSH 1.456, free T4 1.36, free T3 2.6; cholesterol 194, triglycerides 82, HDL 72, LDL 106  10/16/12: CMP normal; TSH 2.20, free T4 1.10, free T3 2.2; cholesterol 202, triglycerides 77, HDL 82, LDL 105  06/18/12: TSH 1.260, free T4 1.58, free T3 2.5, cholesterol 211, triglycerides 71, HDL 78, LDL 119  02/13/12: TSH 0.235, free T4 1.83, cholesterol 236, triglycerides 85, HDL 68, LDL 151. Had been back on Crestor for about 3 weeks, but was off it for about 7 days prior to the labs being drawn.   01/09/12: AST 45, ALT 70, cholesterol 298, triglycerides 85, HDL 72, LDL 209, TSH 0.44, free T4 1.52, 25-hydroxy,  vitamin D 41. Had been off Crestor for months.   09/06/11: TSH 0.663, free T4 1.65, T3 117, cholesterol 225, triglycerides 65, HDL 84, LDL 128  05/09/11: TSH 1.700. Free T4: 1.66. CMP is normal. Cholesterol 214, triglycerides 67, HDL was 73, LDL 128. 25-OH-vitamin D 41.7. She started Zetia as of  05/15/11.  ASSESSMENT: 1. Supraclavicular fullness: The swelling in the supraclavicular fossae has largely resolved. The residual swelling is adipose tissue bilaterally. I don't know what caused her swelling previously.   2. Obesity: She had lost weight in conjunction with her new diet.     3. Hypertension: BP is good today. She still needs daily exercise.  3.  Hypothyroid, s/p I-131: She was euthyroid in January 2015.  She did not have labs done before this visit, so we'll do them today.   4. Hyperlipidemia: The patient's most recent total cholesterol and LDL were much better on Crestor, the lowest they had been in the past year. Unfortunately, her inability to tolerate Crestor makes it difficult to control her lipids.  5. Goiter: Thyroid gland is again within normal size today.  6. Thyroiditis: Hashimoto's disease is clinically quiescent today. The non-physiologic shifts in her TFTs at times are c/w intermittent Hashimoto's Dz activity. 7. Dyspepsia/GERD: Her dyspepsia and GERD are much better on her new diet. She is also eating less of the foods that bother her. 8. Tobacco: At this point in time she is not motivated to stop smoking. We will continue to encourage her.  9. Edema: She does not have any edema today. She sometimes takes in more salt than she needs.  10. 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    PLAN: 1. Diagnostic: TFTs now. Repeat TFTS and fasting lipids one week prior to next visit.  2. Therapeutic: Continue Synthroid 100 mcg pills 6 days per week, but only 1/2 pill on Sundays. Continue omeprazole, 20 mg, as needed. Resume Crestor if possible.   3. Patient education: We discussed her GI problems, her thyroiditis and hypothyroidism, and her neuro issues.  4. Follow-up: Follow up visit in 4 months.  Level of Service: This visit lasted in excess of 45 minutes. More than 50% of the visit was devoted to counseling.  David Stall

## 2013-11-18 NOTE — Patient Instructions (Signed)
Follow up visit in 4 months. Please have lab tests drawn again one week prior to next visit.

## 2013-11-19 LAB — TSH: TSH: 0.539 u[IU]/mL (ref 0.350–4.500)

## 2013-11-19 LAB — T4, FREE: Free T4: 1.3 ng/dL (ref 0.80–1.80)

## 2013-11-19 LAB — T3, FREE: T3, Free: 2.6 pg/mL (ref 2.3–4.2)

## 2013-11-28 ENCOUNTER — Encounter: Payer: Self-pay | Admitting: *Deleted

## 2014-02-19 ENCOUNTER — Encounter: Payer: Self-pay | Admitting: "Endocrinology

## 2014-02-19 ENCOUNTER — Ambulatory Visit (INDEPENDENT_AMBULATORY_CARE_PROVIDER_SITE_OTHER): Payer: Medicare HMO | Admitting: "Endocrinology

## 2014-02-19 VITALS — BP 112/68 | HR 73 | Wt 172.0 lb

## 2014-02-19 DIAGNOSIS — E890 Postprocedural hypothyroidism: Secondary | ICD-10-CM

## 2014-02-19 DIAGNOSIS — I10 Essential (primary) hypertension: Secondary | ICD-10-CM

## 2014-02-19 DIAGNOSIS — R609 Edema, unspecified: Secondary | ICD-10-CM

## 2014-02-19 DIAGNOSIS — E069 Thyroiditis, unspecified: Secondary | ICD-10-CM

## 2014-02-19 DIAGNOSIS — Z720 Tobacco use: Secondary | ICD-10-CM

## 2014-02-19 DIAGNOSIS — E049 Nontoxic goiter, unspecified: Secondary | ICD-10-CM

## 2014-02-19 DIAGNOSIS — R1013 Epigastric pain: Secondary | ICD-10-CM

## 2014-02-19 DIAGNOSIS — F17200 Nicotine dependence, unspecified, uncomplicated: Secondary | ICD-10-CM

## 2014-02-19 DIAGNOSIS — E7800 Pure hypercholesterolemia, unspecified: Secondary | ICD-10-CM

## 2014-02-19 DIAGNOSIS — E669 Obesity, unspecified: Secondary | ICD-10-CM

## 2014-02-19 NOTE — Progress Notes (Signed)
CC: Follow-up hypothyroid s/p I-131, hypertension, goiter, dyspepsia, fatigue, GERD, Hashimoto's disease, status post CVA, right hemiparesis, hyperlipidemia, smoking, and supraclavicular fullness    History of present illness: The patient is a 53 year old Caucasian woman.   1. The patient first presented to me on 08/10/04 in referral from her primary care provider, Dr. Frederica KusterIvan Lazo in [place], [place], for evaluation and management of hyperthyroidism. In retrospect, the patient had chronic problems with infected sinuses, inner ear symptoms, and spinning dizziness. She also had complaints of severe fatigue and weight gain. Dr. Jonette EvaLazo had performed thyroid tests on 06/14/04. TSH was 0.13, free T4 1.04. Thyroid ultrasound showed small and large nodules in a thyroid gland that  was somewhat enlarged overall. On examination she had a 20+ gram thyroid gland. Thyroid gland was firm, but nontender. She also had some diastolic hypertension. Thyroid tests showed a TSH of 0.437, free T4 1.01, and free T3 of 2.6. Her TPO antibody level was 29 9. Her TSI level was 98. It appeared at that time that the patient likely had some elevation of thyroid hormone due to Hashimoto's disease or had a combination of intermittently active Hashimoto's Disease and intermittently active  Graves' Disease. During the next 2 years, her TSH values varied from 0.350-0.854. By February of 2008, however,her TSH dropped to 0.008, her free T4 had increased to 2.08, and her free T3 and increased to 5.8. She was clearly hyperthyroid and thyrotoxic. TSI level at that point was 1.8 with normal being 1.3 or less. On 05/25/06 she underwent radioactive iodine treatment with I-131. She subsequently became hypothyroid and was started on Synthroid replacement therapy. Her doses of Synthroid have been adjusted several times.   2. During the past 9 years the patient has had several other medical problems:  A. She has had one or more CVAs/TIAs, which have resulted  in some mild right hemiparesis and some transient memory losses. Her MRI of the brain in February 2010 showed evidence for remote infarcts involving the posterior right temporal lobe and parietal lobe.    B. She also had a 3-4 month episode of spontaneous galactorrhea in 2010. Brain MRI showed a 2 x 3 mm area of delayed enhancement seen posteriorly on the left side of her pituitary gland. I felt at the time that it would be very uncharacteristic for a true prolactinoma this small to actually produce hyperprolactinemia and clinical galactorrhea. The galactorrhea subsequently resolved spontaneously.   C. On 08/02/2010 she presented with increased swelling of both supraclavicular areas. A CT scan was performed which showed no significant pathology. She was evaluated by Dr. Christia Readingwight Bates of [facility]. Dr. Jenne PaneBates could not identify any cause of the supraclavicular area swelling other than obesity. That swelling has since waxed and waned with her changes in weight.    D. The patient was started on Crestor for hyperlipidemia, but initially could not tolerate even the 5 mg dose more than twice per week. She was subsequently started on Zetia, 10 mg/day, which she tolerated better, but did not control her lipids as well. Crestor was later re-started at a lower dose and she was able to tolerate that medication. However, prior to her last visit she again discontinued the Crestor due to symptoms.   E. She has had multiple GI complaints over time, to include severe, chronic diarrhea, heartburn, and abdominal pains. In the Summer of 2015 she saw a gastroenterologist who performed a colonoscopy and removed a polyp. He also put her on a gluten-free and casein-free  diet. Her symptoms improved on that diet.   3. The patient's last [facility] visit was on 11/18/13.   A. In the interim she has been fairly healthy. She had severe nausea and vomiting and eventually passed a kidney stone on 02/05/14. The experience was very painful. She  has not had any further TIAs or anterior hemi-circumferential neck swelling that gave her the sensation of difficulty breathing.   B.   She is not really adhering to her gluten-free and casein-free diet. When she gets off the diet she has more heartburn, diarrhea, and abdominal pains. .   C. She takes Plavix, 75 mg/day; Synthroid, 100 mcg/day, 6 days per week and 50 mcg on Sundays. She stopped Crestor due to pains all over her body. She takes omeprazole, 20 mg, only when she plans to eat out.  Se takes 500 IU of cholecalciferol daily. She is still smoking. She still loves to eat.   3. Pertinent Review of Systems: Constitutional: She feels "pretty good, but tired all the time". Some days she feels more tired than on other days. She snores a lot and sometimes wakes up choking or having trouble catching her breath.  Eyes: Near vision is not as good. She has to use her bifocals much more. She had an eye exam last month. She was told that she has the beginning stages of macular degeneration. She also has the beginning of cataracts.  Neck: She doesn't have too much postnasal drainage now. She has occasional problems swallowing when her allergies are worse. Her food sometimes gets stuck in her mid-neck. 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 She has no other complaints of anterior neck swelling, soreness, tenderness, pressure, discomfort, or difficulty swallowing. The swelling of the supraclavicular fossae has not been a problem recently.       Heart: She has not had any palpitations in a long time. 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 Her cardiologist confirmed that diagnosis at her follow up appointment in August. She is scheduled for annual follow up. The patient has no other complaints of irregular heat beats, chest pain, or chest pressure. Gastrointestinal: As above. Her GERD and heartburn are better when she takes  her omeprazole even intermittently, but she stopped taking the omeprazole due to concerns that she might get osteoporosis. Spicy foods and soft drinks hurt her even more. She has diarrhea more often than constipation.  Legs: Muscle mass and strength seem unchanged. Her right side remains somewhat weaker than her left. She has had more sciatica recently. She notes some burning of her calves at times. She also has some edema of the lower calves and ankles at times.  Feet: She sometimes has numbness and burning in her feet. She notes some edema at times. Her right leg has become stronger over time, so she rarely drags her right foot anymore.  Neuro: She has persistent bilateral numbness in her thumbs and first two fingers bilaterally. She also has some burning numbness of her nose at times.  Mental/psychological: "I'm doing all right." Her memory and abilities to think, to pay attention, and to make decisions seem better. She says that, "I have all my marbles." Her ability to do simple math is improving.  Skin: When she rubs her skin it sometimes feels painful.   REVIEW OF SYSTEMS: There are no other significant problems involving her other body systems.  PAST  MEDICAL, FAMILY, AND SOCIAL HISTORY:  1. Family: She still lives alone. She has more social interactions now with friends and family. Sometimes she and her sister still encourage each other to eat put too often and too much of the wrong things. She has two sisters with hypothyroidism. Her daughter is also having thyroid problems. 2. Activities: She has not been swimming or walking very much.  3. Tobacco: She is still smoking.   4. Primary care provider: She sees Dr. Marye Round in [place]. Telephone [phone]. 5. Neurologist: Dr. Lacey Jensen, [facility], phone [phone]  PHYSICAL EXAM: BP 112/68 mmHg  Pulse 73  Wt 172 lb (78.019 kg) She has gained 4 pounds since last visit.   Constitutional: The patient looks good  today. Her ironic sense of humor is very good. Her insight and affect are normal. Her memory is much better than it was one year ago. She is quite mentally sharp today.  Eyes: There is no arcus or proptosis.  Mouth: The oropharynx appears normal. The tongue appears normal. There is normal oral moisture. There is no obvious gingivitis. Neck: There are no bruits present. The thyroid gland appears normal in size. The thyroid gland is approximately 18-20 grams in size. Both lobes are within normal size today. The consistency of the thyroid gland is normal. There is no thyroid tenderness to palpation. Her bilateral supraclavicular fullness has increased as she's gained more weight.  Lungs: The lungs are clear. Air movement is good. Heart: The heart rhythm and rate appear normal. Heart sounds S1 and S2 are normal. I do not appreciate any pathologic heart murmurs. Abdomen: The abdomen is enlarged. Bowel sounds are normal. The abdomen is soft and non-tender. There is no obviously palpable hepatomegaly, splenomegaly, or other masses.  Arms: Muscle mass appears appropriate for age.  Hands: She has no tremor bilaterally. Phalangeal and metacarpophalangeal joints appear normal. Palms are normal. Legs: Muscle mass appears appropriate for age. There is no edema today.  Neurologic: Right hand grip is slightly weaker than the left handgrip. Right hip flexor is somewhat weaker than the left hip flexors. Sensation to touch is intact in her legs.Marland Kitchen     LABS:   02/09/14: TSH 2.96, free T4 1.43, free T3 2.2  02/05/14: Urinalysis: large blood, 15 ketones; alkaline phosphatase 108 (normal 32-92);   02/02/14: Urinalysis: trace blood, no ketones; Hgb 13.6, Hct 43.9, MCV 100.3; CMP normal, with alkaline phosphatase 16 (normal 40-129); TSH 3.03; B12 305 (211-946); cholesterol 282, triglycerides 68, HDL 79, LDL 189; 25-hydroxy vitamin D >60  07/11/13: B12 236 (normal 211-911), calcitonin < 2 (normal < 6)  03/10/13: CMP  normal. TSH 1.456, free T4 1.36, free T3 2.6; cholesterol 194, triglycerides 82, HDL 72, LDL 106  11/18/13: TSH .539, free T4 1.30, free T3 2.6  10/16/12: CMP normal; TSH 2.20, free T4 1.10, free T3 2.2; cholesterol 202, triglycerides 77, HDL 82, LDL 105  06/18/12: TSH 1.260, free T4 1.58, free T3 2.5, cholesterol 211, triglycerides 71, HDL 78, LDL 119  02/13/12: TSH 0.235, free T4 1.83, cholesterol 236, triglycerides 85, HDL 68, LDL 151. Had been back on Crestor for about 3 weeks, but was off it for about 7 days prior to the labs being drawn.   01/09/12: AST 45, ALT 70, cholesterol 298, triglycerides 85, HDL 72, LDL 209, TSH 0.44, free T4 1.52, 25-hydroxy,  vitamin D 41. Had been off Crestor for months.   09/06/11: TSH 0.663, free T4 1.65, T3 117, cholesterol 225, triglycerides  65, HDL 84, LDL 128  05/09/11: TSH 1.700. Free T4: 1.66. CMP is normal. Cholesterol 214, triglycerides 67, HDL was 73, LDL 128. 25-OH-vitamin D 41.7. She started Zetia as of  05/15/11.  ASSESSMENT: 1. Supraclavicular fullness: The swelling in the supraclavicular fossae has recurred, in parallel with her weight gain. It appears that the swelling is just fatty tissue.    2. Obesity: She re-gained weight when she went off her diet.      3. Hypertension: BP is good today. She still needs daily exercise.  3. Hypothyroid, s/p I-131: She was euthyroid in January 2015.  She was borderline hyperthyroid in September after a flare up of thyroiditis. She is now borderline hypothyroid. We need to increase her Synthroid to 100 mcg daily.    4. Hypercholesterolemia: The patient's most recent total cholesterol and LDL were worse after stopping Crestor.  Unfortunately, her inability to tolerate Crestor makes it difficult to control her lipids. She hs previously been unable to benefit from Zetia. I suggested that she take low-dose fish oil. 5. Goiter: Thyroid gland is again within normal size today.  6. Thyroiditis: Hashimoto's disease is  clinically quiescent today. The non-physiologic shifts in her TFTs at times are c/w intermittent Hashimoto's Dz activity. 7. Dyspepsia/GERD: Her dyspepsia and GERD were much better on her new diet, but worsened when she stopped the diet. She has been resistant to taking omeprazole due to fears of osteoporosis. I told her that the chances that omeprazole will contribute to osteoporosis are very low, but the chances that her smoking will contribute to osteoporosis are high.  8. Tobacco: At this point in time she is not motivated to stop smoking. We will continue to encourage her. I told her that she is fortunate that she has been abe to recover so well from her previous stroke(s). She may not be so lucky the next time. If she wants to preserve her brain tissue and her abilities to be active and to live  Independently, she needs to stop smoking.  9. Edema: She does not have any edema today. She sometimes takes in more salt than she needs.  10. 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 When she was on the diet her diarrhea stopped. 11. Excess vitamin D: She needs to reduce her vitamin D intake.   PLAN: 1. Diagnostic: Repeat TFTS, 25-hydroxy vitamin D, PTH, calcium, and fasting lipids one week prior to next visit.  2. Therapeutic: Change Synthroid to 100 mcg pills daily. Continue omeprazole, 20 mg, as needed. Resume Crestor if possible.  Reduce cholecalciferol to 5000 IU 5 days per week, skip Wednesdays and Sundays.  3. Patient education: We discussed her GI problems, her thyroiditis and hypothyroidism, and her neuro issues. I counseled her again about stopping smoking.  4. Follow-up: Follow up visit in 4 months.  Level of Service: This visit lasted in excess of 50 minutes. More than 50% of the visit was devoted to counseling.  David StallBRENNAN,Chiyoko Torrico J

## 2014-02-19 NOTE — Patient Instructions (Signed)
Follow up visit in 3 months. 

## 2014-03-23 ENCOUNTER — Ambulatory Visit: Payer: Medicare HMO | Admitting: "Endocrinology

## 2014-04-07 ENCOUNTER — Other Ambulatory Visit: Payer: Self-pay | Admitting: *Deleted

## 2014-04-07 DIAGNOSIS — E034 Atrophy of thyroid (acquired): Secondary | ICD-10-CM

## 2014-05-21 ENCOUNTER — Encounter: Payer: Self-pay | Admitting: "Endocrinology

## 2014-05-21 ENCOUNTER — Ambulatory Visit (INDEPENDENT_AMBULATORY_CARE_PROVIDER_SITE_OTHER): Payer: Medicare PPO | Admitting: "Endocrinology

## 2014-05-21 VITALS — BP 111/64 | HR 64 | Wt 172.0 lb

## 2014-05-21 DIAGNOSIS — IMO0002 Reserved for concepts with insufficient information to code with codable children: Secondary | ICD-10-CM

## 2014-05-21 DIAGNOSIS — R197 Diarrhea, unspecified: Secondary | ICD-10-CM

## 2014-05-21 DIAGNOSIS — I10 Essential (primary) hypertension: Secondary | ICD-10-CM | POA: Diagnosis not present

## 2014-05-21 DIAGNOSIS — E890 Postprocedural hypothyroidism: Secondary | ICD-10-CM

## 2014-05-21 DIAGNOSIS — F17200 Nicotine dependence, unspecified, uncomplicated: Secondary | ICD-10-CM

## 2014-05-21 DIAGNOSIS — E669 Obesity, unspecified: Secondary | ICD-10-CM | POA: Diagnosis not present

## 2014-05-21 DIAGNOSIS — K219 Gastro-esophageal reflux disease without esophagitis: Secondary | ICD-10-CM

## 2014-05-21 DIAGNOSIS — E7800 Pure hypercholesterolemia, unspecified: Secondary | ICD-10-CM

## 2014-05-21 DIAGNOSIS — Z720 Tobacco use: Secondary | ICD-10-CM

## 2014-05-21 DIAGNOSIS — E559 Vitamin D deficiency, unspecified: Secondary | ICD-10-CM

## 2014-05-21 DIAGNOSIS — R1013 Epigastric pain: Secondary | ICD-10-CM

## 2014-05-21 DIAGNOSIS — E063 Autoimmune thyroiditis: Secondary | ICD-10-CM

## 2014-05-21 NOTE — Patient Instructions (Signed)
Follow up visit in 4 months.  

## 2014-05-21 NOTE — Progress Notes (Signed)
CC: Follow-up hypothyroid s/p I-131, hypertension, goiter, dyspepsia, fatigue, GERD, Hashimoto's disease, status post CVA, right hemiparesis, hyperlipidemia, smoking, and supraclavicular fullness    History of present illness: The patient is a 54 year old Caucasian woman.   1. The patient first presented to me on 08/10/04 in referral from her primary care provider, Dr. Frederica KusterIvan Lazo in [place], [place], for evaluation and management of hyperthyroidism. In retrospect, the patient had chronic problems with infected sinuses, inner ear symptoms, and spinning dizziness. She also had complaints of severe fatigue and weight gain. Dr. Jonette EvaLazo had performed thyroid tests on 06/14/04. TSH was 0.13, free T4 1.04. Thyroid ultrasound showed small and large nodules in a thyroid gland that  was somewhat enlarged overall. On examination she had a 20+ gram thyroid gland. Thyroid gland was firm, but nontender. She also had some diastolic hypertension. Thyroid tests showed a TSH of 0.437, free T4 1.01, and free T3 of 2.6. Her TPO antibody level was 29 9. Her TSI level was 98. It appeared at that time that the patient likely had some elevation of thyroid hormone due to Hashimoto's disease or had a combination of intermittently active Hashimoto's Disease and intermittently active  Graves' Disease. During the next 2 years, her TSH values varied from 0.350-0.854. By February of 2008, however,her TSH dropped to 0.008, her free T4 had increased to 2.08, and her free T3 and increased to 5.8. She was clearly hyperthyroid and thyrotoxic. TSI level at that point was 1.8 with normal being 1.3 or less. On 05/25/06 she underwent radioactive iodine treatment with I-131. She subsequently became hypothyroid and was started on Synthroid replacement therapy. Her doses of Synthroid have been adjusted several times.   2. During the past 9 years the patient has had several other medical problems:  A. She has had one or more CVAs/TIAs, which have resulted  in some mild right hemiparesis and some transient memory losses. Her MRI of the brain in February 2010 showed evidence for remote infarcts involving the posterior right temporal lobe and parietal lobe.    B. She also had a 3-4 month episode of spontaneous galactorrhea in 2010. Brain MRI showed a 2 x 3 mm area of delayed enhancement seen posteriorly on the left side of her pituitary gland. I felt at the time that it would be very uncharacteristic for a true prolactinoma this small to actually produce hyperprolactinemia and clinical galactorrhea. The galactorrhea subsequently resolved spontaneously.   C. On 08/02/2010 she presented with increased swelling of both supraclavicular areas. A CT scan was performed which showed no significant pathology. She was evaluated by Dr. Christia Readingwight Bates of [facility]. Dr. Jenne PaneBates could not identify any cause of the supraclavicular area swelling other than obesity. That swelling has since waxed and waned with her changes in weight.    D. The patient was started on Crestor for hyperlipidemia, but initially could not tolerate even the 5 mg dose more than twice per week. She was subsequently started on Zetia, 10 mg/day, which she tolerated better, but did not control her lipids as well. Crestor was later re-started at a lower dose and she was able to tolerate that medication. However, prior to her last visit she again discontinued the Crestor due to symptoms.   E. She has had multiple GI complaints over time, to include severe, chronic diarrhea, heartburn, and abdominal pains. In the Summer of 2015 she saw a gastroenterologist who performed a colonoscopy and removed a polyp. He also put her on a gluten-free and casein-free  diet. Her symptoms improved on that diet.   3. The patient's last [facility] visit was on 02/19/14. I increased her Synthroid dose to 100 mcg/day at that visit. I continued her omeprazole and asked her to try to resume Crestor. I also asked her to reduce her vitamin d  to 5000 IU daily for 5 days of each week.   A. In the interim she has been fairly healthy. She has not had any further acute problems with her kidney stone. She has not had any further TIAs or anterior hemi-circumferential neck swelling that gave her the sensation of difficulty breathing.   B.   She fluctuates with adherence to her gluten-free and casein-free diet. When she gets off the diet she has more heartburn, diarrhea, and abdominal pains. .   C. She takes Plavix, 75 mg/day; Synthroid, 100 mcg/day, 7 days per week. She has not been able to tolerate the Crestor. She takes omeprazole, 20 mg, only when she plans to eat out.  She takes 5000 IU of cholecalciferol 5 days per week. She is also taking an adult MVI. She is still smoking. She still loves to eat.   3. Pertinent Review of Systems: Constitutional: She feels "tired, drained, and like I'm glued to the toilet:. Some days she feels more tired than on other days. She has watery stools several times per day, sometimes all day long. She stools more frequently after she eats or drinks sodas. She sometimes has fecal incontinence. She snores a lot and sometimes wakes up choking or having trouble catching her breath.  Eyes: Near vision is not as good. She has to use her bifocals much more. She had an eye exam last month. She was told that she has the beginning stages of macular degeneration. She also has the beginning of cataracts.  Neck: her allergies are acting up now ans she has more postnasal drainage now. She still has occasional problems swallowing when her allergies are worse or when she eats certain spices. 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 She has no other complaints of anterior neck swelling, soreness, tenderness, pressure, discomfort, or difficulty swallowing. The swelling of the supraclavicular fossae has not been a problem recently.       Heart: She rarely has some brief palpitations. 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 Her cardiologist confirmed that diagnosis at her follow up appointment in August. She is scheduled for annual follow up. The patient has no other complaints of irregular heat beats, chest pain, or chest pressure. Gastrointestinal: As above. Her GERD and heartburn are better when she takes her omeprazole even intermittently, but she stopped taking the omeprazole due to concerns that she might get osteoporosis. Spicy foods and soft drinks hurt her even more. She has diarrhea more often than constipation.  Legs: Muscle mass and strength seem unchanged. Her right side remains somewhat weaker than her left. She has had more sciatica recently. She notes some burning of her calves at times. She also has some edema of the lower calves and ankles at times.  Feet: She sometimes has numbness and burning in her feet. She notes some edema at times. Her right leg has become stronger over time, so she rarely drags her right foot anymore.  Neuro: She has persistent bilateral numbness in her thumbs and first two fingers bilaterally. She also has some burning numbness of her nose at  times.  Mental/psychological: "I'm doing all right." Her memory and abilities to think, to pay attention, and to make decisions seem better. She says that, "I have all my marbles." Her ability to do simple math is improving.  Skin: When she rubs her skin it sometimes feels painful.   REVIEW OF SYSTEMS: There are no other significant problems involving her other body systems.  PAST MEDICAL, FAMILY, AND SOCIAL HISTORY:  1. Family: She still lives alone. She has more social interactions now with friends and family. Sometimes she and her sister still encourage each other to eat put too often and too much of the wrong things. She has two sisters with hypothyroidism. Her daughter is also having thyroid problems. 2. Activities: She has not been swimming or walking  very much.  3. Tobacco: She is still smoking.   4. Primary care provider: She sees Dr. Marye Round in [place]. Telephone [phone]. 5. Neurologist: Dr. Lacey Jensen, [facility], phone [phone]  PHYSICAL EXAM: BP: 111/64, HR 64, weight 172 Her weight is unchanged.    Constitutional: The patient looks good today. Her ironic sense of humor is very good. Her insight and affect are normal. Her memory is much better than it was one year ago. She is quite mentally sharp today.  Eyes: There is no arcus or proptosis.  Mouth: The oropharynx appears normal. The tongue appears normal. There is normal oral moisture. There is no obvious gingivitis. Neck: There are no bruits present. The thyroid gland appears normal in size. The thyroid gland is approximately 18 grams in size. Both lobes are within normal size today. The consistency of the thyroid gland is normal. There is no thyroid tenderness to palpation. Her bilateral supraclavicular fullness has increased as she's gained more weight.  Lungs: The lungs are clear. Air movement is good. Heart: The heart rhythm and rate appear normal. Heart sounds S1 and S2 are normal. I do not appreciate any pathologic heart murmurs. Abdomen: The abdomen is enlarged. Bowel sounds are normal. The abdomen is soft and non-tender. There is no obviously palpable hepatomegaly, splenomegaly, or other masses. She is mildly tender in the RLQ.  Arms: Muscle mass appears appropriate for age.  Hands: She has no tremor bilaterally. Phalangeal and metacarpophalangeal joints appear normal. Palms are normal. Legs: Muscle mass appears appropriate for age. There is no edema today.  Neurologic: Right hand grip is slightly weaker than the left handgrip. Right hip flexors are somewhat weaker than the left hip flexors. Sensation to touch is intact in her legs.     LABS:    02/09/14: TSH 2.96, free T4 1.43, free T3 2.2  02/05/14: Urinalysis: large blood, 15 ketones;  alkaline phosphatase 108 (normal 32-92);   02/02/14: Urinalysis: trace blood, no ketones; Hgb 13.6, Hct 43.9, MCV 100.3; CMP normal, with alkaline phosphatase 16 (normal 40-129); TSH 3.03; B12 305 (211-946); cholesterol 282, triglycerides 68, HDL 79, LDL 189; 25-hydroxy vitamin D >60  07/11/13: B12 236 (normal 211-911), calcitonin < 2 (normal < 6)  03/10/13: CMP normal. TSH 1.456, free T4 1.36, free T3 2.6; cholesterol 194, triglycerides 82, HDL 72, LDL 106  11/18/13: TSH .539, free T4 1.30, free T3 2.6  10/16/12: CMP normal; TSH 2.20, free T4 1.10, free T3 2.2; cholesterol 202, triglycerides 77, HDL 82, LDL 105  06/18/12: TSH 1.260, free T4 1.58, free T3 2.5, cholesterol 211, triglycerides 71, HDL 78, LDL 119  02/13/12: TSH 0.235, free T4 1.83, cholesterol 236, triglycerides 85, HDL 68, LDL 151. Had  been back on Crestor for about 3 weeks, but was off it for about 7 days prior to the labs being drawn.   01/09/12: AST 45, ALT 70, cholesterol 298, triglycerides 85, HDL 72, LDL 209, TSH 0.44, free T4 1.52, 25-hydroxy,  vitamin D 41. Had been off Crestor for months.   09/06/11: TSH 0.663, free T4 1.65, T3 117, cholesterol 225, triglycerides 65, HDL 84, LDL 128  05/09/11: TSH 1.700. Free T4: 1.66. CMP is normal. Cholesterol 214, triglycerides 67, HDL was 73, LDL 128. 25-OH-vitamin D 41.7. She started Zetia as of  05/15/11.  ASSESSMENT: 1. Supraclavicular fullness: The swelling in the supraclavicular fossae has recurred, in parallel with her weight gain. It appears that the swelling is just fatty tissue.    2. Obesity: her weight is unchanged.her GI issues make it difficult for her to exercise.       3. Hypertension: BP is good today. She still needs daily exercise.  3. Hypothyroid, s/p I-131: She was euthyroid in January 2015.  She was borderline hyperthyroid in September after a flare up of thyroiditis. She was borderline hypothyroid last December. We need to check her TFTs now.     4.  Hypercholesterolemia: The patient's most recent total cholesterol and LDL were worse after stopping Crestor.  Unfortunately, her inability to tolerate Crestor makes it difficult to control her lipids. She hs previously been unable to benefit from Zetia. She is taking two fish oil pills per day. 5. Goiter: Thyroid gland is even smaller size today.  6. Thyroiditis: Hashimoto's disease is clinically quiescent today. The non-physiologic shifts in her TFTs at times are c/w intermittent Hashimoto's Dz activity. 7. Dyspepsia/GERD: Her dyspepsia and GERD were much better on her new diet, but worsened when she stopped the diet. Her symptoms are better when she takes her omeprazole.   8. Tobacco: At this point in time she is not motivated to stop smoking. We will continue to encourage her. I told her that she is fortunate that she has been abe to recover so well from her previous stroke(s). She may not be so lucky the next time. If she wants to preserve her brain tissue and her abilities to be active and to live  Independently, she needs to stop smoking.  9. Edema: She does not have any edema today. She sometimes takes in more salt than she needs.  10. 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 When she was on the diet her diarrhea stopped. 11. Excess vitamin D: We need to re-check her vitamin D level.    PLAN: 1. Diagnostic: Repeat TFTS, 25-hydroxy vitamin D, PTH, calcium, CMP, and fasting lipids today. Also check calcitonin. Call me about one month to see if ging back on her diet has changed her diarrhea.  2. Therapeutic: Continue Synthroid at 100 mcg pills daily. Continue omeprazole, 20 mg, as needed. Continue the cholecalciferol at 5000 IU 5 days per week, skip Wednesdays and Sundays.  3. Patient education: We discussed her GI problems, her thyroiditis and hypothyroidism, and her neuro issues. I counseled her again about stopping smoking.  4. Follow-up: Follow up visit in 4 months.  Level of  Service: This visit lasted in excess of 50 minutes. More than 50% of the visit was devoted to counseling.  David Stall

## 2014-05-22 LAB — PTH, INTACT AND CALCIUM
Calcium: 9.3 mg/dL (ref 8.4–10.5)
PTH: 52 pg/mL (ref 14–64)

## 2014-05-22 LAB — HEMOGLOBIN A1C
Hgb A1c MFr Bld: 5.8 % — ABNORMAL HIGH (ref <5.7)
MEAN PLASMA GLUCOSE: 120 mg/dL — AB (ref <117)

## 2014-05-22 LAB — LIPID PANEL
CHOLESTEROL: 264 mg/dL — AB (ref 0–200)
HDL: 70 mg/dL (ref >=46)
LDL Cholesterol: 176 mg/dL — ABNORMAL HIGH (ref 0–99)
Total CHOL/HDL Ratio: 3.8 Ratio
Triglycerides: 92 mg/dL (ref <150)
VLDL: 18 mg/dL (ref 0–40)

## 2014-05-22 LAB — CALCIUM: CALCIUM: 9.3 mg/dL (ref 8.4–10.5)

## 2014-05-22 LAB — VITAMIN D 25 HYDROXY (VIT D DEFICIENCY, FRACTURES): Vit D, 25-Hydroxy: 40 ng/mL (ref 30–100)

## 2014-05-22 LAB — T4, FREE: FREE T4: 1.46 ng/dL (ref 0.80–1.80)

## 2014-05-22 LAB — TSH: TSH: 0.49 u[IU]/mL (ref 0.350–4.500)

## 2014-05-22 LAB — T3, FREE: T3, Free: 2.4 pg/mL (ref 2.3–4.2)

## 2014-05-23 LAB — CALCITONIN

## 2014-06-17 ENCOUNTER — Other Ambulatory Visit: Payer: Self-pay | Admitting: "Endocrinology

## 2014-07-08 ENCOUNTER — Encounter: Payer: Self-pay | Admitting: *Deleted

## 2014-09-17 ENCOUNTER — Other Ambulatory Visit: Payer: Self-pay | Admitting: "Endocrinology

## 2014-09-23 ENCOUNTER — Other Ambulatory Visit: Payer: Self-pay | Admitting: *Deleted

## 2014-09-23 DIAGNOSIS — E890 Postprocedural hypothyroidism: Secondary | ICD-10-CM

## 2014-09-24 ENCOUNTER — Encounter: Payer: Self-pay | Admitting: "Endocrinology

## 2014-09-24 ENCOUNTER — Ambulatory Visit (INDEPENDENT_AMBULATORY_CARE_PROVIDER_SITE_OTHER): Payer: Medicare PPO | Admitting: "Endocrinology

## 2014-09-24 VITALS — BP 116/70 | HR 70 | Wt 175.0 lb

## 2014-09-24 DIAGNOSIS — E890 Postprocedural hypothyroidism: Secondary | ICD-10-CM | POA: Diagnosis not present

## 2014-09-24 DIAGNOSIS — R609 Edema, unspecified: Secondary | ICD-10-CM

## 2014-09-24 DIAGNOSIS — E049 Nontoxic goiter, unspecified: Secondary | ICD-10-CM

## 2014-09-24 DIAGNOSIS — E669 Obesity, unspecified: Secondary | ICD-10-CM

## 2014-09-24 DIAGNOSIS — E063 Autoimmune thyroiditis: Secondary | ICD-10-CM

## 2014-09-24 DIAGNOSIS — E673 Hypervitaminosis D: Secondary | ICD-10-CM

## 2014-09-24 DIAGNOSIS — E7800 Pure hypercholesterolemia, unspecified: Secondary | ICD-10-CM

## 2014-09-24 DIAGNOSIS — I10 Essential (primary) hypertension: Secondary | ICD-10-CM | POA: Diagnosis not present

## 2014-09-24 DIAGNOSIS — R1013 Epigastric pain: Secondary | ICD-10-CM

## 2014-09-24 NOTE — Patient Instructions (Signed)
Follow up visit in 4 months. Please have lab tests done prior to next visit.

## 2014-09-24 NOTE — Progress Notes (Signed)
CC: Follow-up hypothyroid s/p I-131, hypertension, goiter, dyspepsia, fatigue, GERD, Hashimoto's disease, status post CVA, right hemiparesis, hyperlipidemia, smoking, and supraclavicular fullness    History of present illness: The patient is a 54 year old Caucasian woman.   1. The patient first presented to me on 08/10/04 in referral from her primary care provider, Dr. Frederica Kuster in [place], [place], for evaluation and management of hyperthyroidism. In retrospect, the patient had chronic problems with infected sinuses, inner ear symptoms, and spinning dizziness. She also had complaints of severe fatigue and weight gain. Dr. Jonette Eva had performed thyroid tests on 06/14/04. TSH was 0.13, free T4 1.04. Thyroid ultrasound showed small and large nodules in a thyroid gland that  was somewhat enlarged overall. On examination she had a 20+ gram thyroid gland. Thyroid gland was firm, but nontender. She also had some diastolic hypertension. Thyroid tests showed a TSH of 0.437, free T4 1.01, and free T3 of 2.6. Her TPO antibody level was 29 9. Her TSI level was 98. It appeared at that time that the patient likely had some elevation of thyroid hormone due to Hashimoto's disease or had a combination of intermittently active Hashimoto's Disease and intermittently active  Graves' Disease. During the next 2 years, her TSH values varied from 0.350-0.854. By February of 2008, however,her TSH dropped to 0.008, her free T4 had increased to 2.08, and her free T3 and increased to 5.8. She was clearly hyperthyroid and thyrotoxic. TSI level at that point was 1.8 with normal being 1.3 or less. On 05/25/06 she underwent radioactive iodine treatment with I-131. She subsequently became hypothyroid and was started on Synthroid replacement therapy. Since then her doses of Synthroid have been adjusted several times.   2. During the past 9 years the patient has had several other medical problems:  A. She has had one or more CVAs/TIAs, which  have resulted in some mild right hemiparesis and some transient memory losses. Her MRI of the brain in February 2010 showed evidence for remote infarcts involving the posterior right temporal lobe and parietal lobe.    B. She also had a 3-4 month episode of spontaneous galactorrhea in 2010. Brain MRI showed a 2 x 3 mm area of delayed enhancement seen posteriorly on the left side of her pituitary gland. I felt at the time that it would be very uncharacteristic for a true prolactinoma this small to actually produce hyperprolactinemia and clinical galactorrhea. The galactorrhea subsequently resolved spontaneously.   C. On 08/02/2010 she presented with increased swelling of both supraclavicular areas. A CT scan was performed which showed no significant pathology. She was evaluated by Dr. Christia Reading of [facility]. Dr. Jenne Pane could not identify any cause of the supraclavicular area swelling other than obesity. That swelling has since waxed and waned with her changes in weight.    D. The patient was started on Crestor for hyperlipidemia, but initially could not tolerate even the 5 mg dose more than twice per week. She was subsequently started on Zetia, 10 mg/day, which she tolerated better, but did not control her lipids as well. Crestor was later re-started at a lower dose and she was able to tolerate that medication. However, prior to her last visit she again discontinued the Crestor due to symptoms.   E. She has had multiple GI complaints over time, to include severe, chronic diarrhea, heartburn, and abdominal pains. In the Summer of 2015 she saw a gastroenterologist who performed a colonoscopy and removed a polyp. He also put her on a gluten-free  and casein-free diet. Her symptoms improved on that diet.   3. The patient's last [facility] visit was on 05/21/14. I continued her Synthroid dose of 100 mcg/day, continued her omeprazole, and continued her vitamin D.    A. In the interim she has been fairly healthy, but  still has her aches and pains. She has not had any further acute problems with kidney stones, TIAs, or anterior hemi-circumferential neck swelling that gave her the sensation of difficulty breathing.   B.   She really dose not consistently follow her gluten-free and casein-free diet. In reviewing her food diary, it appears that the less she followed the diet, the more frequently and severe her GI symptoms were.    C. She takes Plavix, 75 mg/day; Synthroid, 100 mcg/day, 7 days per week; omeprazole, 20 mg, only when she plans to eat out.  She stopped taking 5000 IU of cholecalciferol 5 days per week. She now takes Surveyor, quantity every day. She is still smoking, but less. She still loves to eat and really pretty much eats what she wants to eat when she wants to eat it.   3. Pertinent Review of Systems: Constitutional: She feels "okay overall, but tired". Some days she feels more tired than on other days. She has watery/mushy stools most days, from 1-5 times per day. She still consumes a large amount of bread and breading.  She snores a lot and sometimes wakes up choking or having trouble catching her breath.  Eyes: Near vision is not as good. She has to use her bifocals much more. She had an eye exam 5 months ago. She was told that she had the beginning stages of macular degeneration. She also had the beginning of cataracts.  Neck: Her allergies are acting up now and she has more postnasal drainage now. She still has occasional problems swallowing when her allergies are worse or when she eats certain spices. 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 She has no other complaints of anterior neck swelling, soreness, tenderness, pressure, discomfort, or difficulty swallowing. The swelling of the supraclavicular fossae has not been a problem recently.       Heart: She rarely has some brief palpitations. 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 Her cardiologist confirmed that diagnosis at her follow up appointment in August 2015. She is scheduled for annual follow up. The patient has no other complaints of irregular heat beats, chest pain, or chest pressure. Gastrointestinal: As above. Her GERD and heartburn are better when she takes her omeprazole even intermittently, but she stopped taking the omeprazole due to concerns that she might get osteoporosis. Spicy foods and soft drinks hurt her even more. She has diarrhea more often than constipation.  Legs: Muscle mass and strength seem unchanged. Her right side remains somewhat weaker than her left. She has had more sciatica recently. She notes some burning of her calves at times. She also has some edema of the lower calves and ankles at times.  Feet: She sometimes has numbness and burning in her feet. She notes some edema at times. Her right leg has become stronger over time, so she rarely drags her right foot anymore.  Neuro: She has persistent bilateral numbness in her thumbs and first two fingers bilaterally.  Mental/psychological: "I'm doing all right." Her memory and abilities to think, to pay attention, and to make decisions seem better. She says  that, "I get frustrated with myself sometimes." Her ability to do simple math has improved.  Skin: When she rubs her skin it sometimes feels painful.   REVIEW OF SYSTEMS: There are no other significant problems involving her other body systems.  PAST MEDICAL, FAMILY, AND SOCIAL HISTORY:  1. Family: She still lives alone. She has more social interactions now with friends and family. She and her sister still encourage each other to eat out too often and too much of the wrong things. She has two sisters with hypothyroidism. Her daughter is also having thyroid problems. 2. Activities: She has not been swimming or walking very much.  3. Tobacco: She is still smoking.   4. Primary care provider: She sees Dr. Marye Round in [place].  Telephone [phone]. 5. Neurologist: Dr. Lacey Jensen, [facility], phone [phone]  PHYSICAL EXAM: BP: 116/70, HR 70, weight 175 She has gained 6 pounds since last visit. Constitutional: The patient looks good today. Her ironic sense of humor is very good. Her insight and affect are normal. Her memory is much better than it was one year ago. She is quite mentally sharp today.  Eyes: There is no arcus or proptosis.  Mouth: The oropharynx appears normal. The tongue appears normal. There is normal oral moisture. There is no obvious gingivitis. Neck: There are no bruits present. The thyroid gland appears normal in size. The thyroid gland is approximately 18 grams in size. Both lobes are within normal size today. The consistency of the thyroid gland is normal. There is no thyroid tenderness to palpation. Her bilateral supraclavicular fullness has decreased today.   Lungs: The lungs are clear. Air movement is good. Heart: The heart rhythm and rate appear normal. Heart sounds S1 and S2 are normal. I do not appreciate any pathologic heart murmurs. Abdomen: The abdomen is enlarged. Bowel sounds are normal. The abdomen is soft and non-tender. There is no obviously palpable hepatomegaly, splenomegaly, or other masses. She is not tender today.   Arms: Muscle mass appears appropriate for age.  Hands: She has 1+ tremor bilaterally. Phalangeal and metacarpophalangeal joints appear normal. Palms are normal. Legs: Muscle mass appears appropriate for age. There is no edema today.  Neurologic: Right hand grip is slightly weaker than the left handgrip. Right hip flexors are somewhat weaker than the left hip flexors. Sensation to touch is intact in her legs.     LABS:   09/24/14: pending  05/21/14: TSH 0.490, free T4 1.46, free T3 2.4; HbA1C 5.8%; CALCIUM 9.3, PTH 52; 25-OH vitamin D 40; cholesterol 246, triglycerides 92, HDL 70, LDL 176; calcitonin <2  02/09/14: TSH 2.96, free T4 1.43,  free T3 2.2  02/05/14: Urinalysis: large blood, 15 ketones; alkaline phosphatase 108 (normal 32-92);   02/02/14: Urinalysis: trace blood, no ketones; Hgb 13.6, Hct 43.9, MCV 100.3; CMP normal, with alkaline phosphatase 16 (normal 40-129); TSH 3.03; B12 305 (211-946); cholesterol 282, triglycerides 68, HDL 79, LDL 189; 25-hydroxy vitamin D >60  07/11/13: B12 236 (normal 211-911), calcitonin < 2 (normal < 6)  03/10/13: CMP normal. TSH 1.456, free T4 1.36, free T3 2.6; cholesterol 194, triglycerides 82, HDL 72, LDL 106  11/18/13: TSH .539, free T4 1.30, free T3 2.6  10/16/12: CMP normal; TSH 2.20, free T4 1.10, free T3 2.2; cholesterol 202, triglycerides 77, HDL 82, LDL 105  06/18/12: TSH 1.260, free T4 1.58, free T3 2.5, cholesterol 211, triglycerides 71, HDL 78, LDL 119  02/13/12: TSH 0.235, free T4 1.83, cholesterol 236, triglycerides 85,  HDL 68, LDL 151. Had been back on Crestor for about 3 weeks, but was off it for about 7 days prior to the labs being drawn.   01/09/12: AST 45, ALT 70, cholesterol 298, triglycerides 85, HDL 72, LDL 209, TSH 0.44, free T4 1.52, 25-hydroxy,  vitamin D 41. Had been off Crestor for months.   09/06/11: TSH 0.663, free T4 1.65, T3 117, cholesterol 225, triglycerides 65, HDL 84, LDL 128  05/09/11: TSH 1.700. Free T4: 1.66. CMP is normal. Cholesterol 214, triglycerides 67, HDL was 73, LDL 128. 25-OH-vitamin D 41.7. She started Zetia as of  05/15/11.  ASSESSMENT: 1. Supraclavicular fullness: The swelling in the supraclavicular fossae has improved since last visit. It appears that the swelling is just fatty tissue.    2. Obesity: Her weight is higher. Her GI issues make it difficult for her to exercise.       3. Hypertension: BP is good today. She still needs daily exercise.  3. Hypothyroid, s/p I-131: She was euthyroid in January 2015.  She was borderline hyperthyroid in March of this year. We need to review her lab results from this morning's blood draw.      4.  Hypercholesterolemia: The patient's most recent total cholesterol and LDL were worse after stopping Crestor.  Unfortunately, her inability to tolerate Crestor and other statins makes it difficult to control her lipids. She hs previously been unable to benefit from Zetia. She is taking two fish oil pills per day. 5. Goiter: Thyroid gland is again within normal limits for size today.   6. Thyroiditis: Hashimoto's disease is clinically quiescent today. The non-physiologic shifts in her TFTs at times are c/w intermittent Hashimoto's Dz activity. 7. Dyspepsia/GERD: Her dyspepsia and GERD were much better on her new diet, but worsened when she stopped the diet. Her symptoms are also better when she takes her omeprazole.   8. Tobacco: At this point in time she is not motivated to stop smoking. We will continue to encourage her. I told her that she is fortunate that she has been abe to recover so well from her previous stroke(s). She may not be so lucky the next time. If she wants to preserve her brain tissue and her abilities to be active and to live  Independently, she needs to stop smoking.  9. Edema: She does not have any edema today. She sometimes takes in more salt than she needs.  10. 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 When she was on the diet her diarrhea stopped. Her calcitonin was negative, ruling out hypercalcitoninemia as a cause of her diarrhea.  11. Excess vitamin D: Her vitamin d level in march was normal.  12. S/p CVA:  Her MRI on 04/16/08 showed CNS defects in the right cerebrum. Her neurologic defects, however, were on the right side of her body. She may have had more CNS damage in her left cerebrum that has been recognized.    PLAN: 1. Diagnostic: Repeat TFTS, 25-hydroxy vitamin D, PTH, calcium, CMP, and fasting lipids prior to next visit. Consider calcitonin.  Call me about one month to see if going back on her diet has changed her diarrhea.  2. Therapeutic: Continue  Synthroid at 100 mcg pills daily. Continue omeprazole, 20 mg, as needed. Continue the Centrum silver.  Stop smoking.  3. Patient education: We discussed her GI problems, her thyroiditis and hypothyroidism, and her neuro issues. I counseled her again about stopping smoking.  4. Follow-up: Follow up visit in  4 months.  Level of Service: This visit lasted in excess of 50 minutes. More than 50% of the visit was devoted to counseling.  David Stall

## 2014-09-25 LAB — LIPID PANEL
Cholesterol: 269 mg/dL — ABNORMAL HIGH (ref 0–200)
HDL: 87 mg/dL (ref >=46)
LDL CALC: 167 mg/dL — AB (ref 0–99)
Total CHOL/HDL Ratio: 3.1 Ratio
Triglycerides: 73 mg/dL (ref <150)
VLDL: 15 mg/dL (ref 0–40)

## 2014-09-25 LAB — TSH: TSH: 10.128 u[IU]/mL — ABNORMAL HIGH (ref 0.350–4.500)

## 2014-09-25 LAB — HEMOGLOBIN A1C
Hgb A1c MFr Bld: 5.7 % — ABNORMAL HIGH (ref <5.7)
Mean Plasma Glucose: 117 mg/dL — ABNORMAL HIGH (ref <117)

## 2014-09-25 LAB — PTH, INTACT AND CALCIUM
CALCIUM: 9.5 mg/dL (ref 8.4–10.5)
PTH: 44 pg/mL (ref 14–64)

## 2014-09-25 LAB — VITAMIN D 25 HYDROXY (VIT D DEFICIENCY, FRACTURES): VIT D 25 HYDROXY: 37 ng/mL (ref 30–100)

## 2014-09-25 LAB — T4, FREE: Free T4: 1.17 ng/dL (ref 0.80–1.80)

## 2014-09-25 LAB — T3, FREE: T3, Free: 2 pg/mL — ABNORMAL LOW (ref 2.3–4.2)

## 2014-11-26 ENCOUNTER — Telehealth: Payer: Self-pay | Admitting: *Deleted

## 2014-11-26 NOTE — Telephone Encounter (Signed)
Received TC from patient said that her PCP had labs drawn and TSH was high, her PCP added 25 mcg of generic Synthroid on top of 100 mcg of Synthroid. Will have PCP fax lab results and have Dr. Fransico Michael review them for med changes.

## 2014-12-21 ENCOUNTER — Telehealth: Payer: Self-pay | Admitting: "Endocrinology

## 2014-12-21 NOTE — Telephone Encounter (Signed)
Routed to provider

## 2014-12-23 ENCOUNTER — Other Ambulatory Visit: Payer: Self-pay | Admitting: "Endocrinology

## 2014-12-28 ENCOUNTER — Telehealth: Payer: Self-pay | Admitting: "Endocrinology

## 2014-12-28 NOTE — Telephone Encounter (Signed)
1. [name] called and I returned her call. 2. Subjective:   A. She had a tick bite in August, resulting in swelling and inflammation, c/w [place] Spotted Fever. She was treated with antibiotics for the RMSF. She has been having a lot of joint pains.    B. In September, her labs showed that she was hypothyroid again. Her PCP, Dr. Donnita FallsWendy [name], added 25 mcg of generic levothyroxine to her Synthroid dose of 100 mcg/day on 11/25/14.   C. She says that in the interim between her last labs in March and her labs in July and September she did not taking any iron or calcium tablets. She has been taking her MVI in the morning along with Synthroid. She is still having a lot of diarrhea throughout the day. 3. Objective: Labs 11/13/14: TSH 22.190, free T4 1.26; CBC normal except for a MCV of 101 (normal 79-97); CMP essentially normal; cholesterol 301, triglycerides 68, HDL 95, LDL 192; RMSF IgG IFA 1;64 = positive. 4. Assessment:  A. [name] is more hypothyroid than she was in June. For some unknown reason she never received the message in July to increase her Synthroid dose to 112 mcg/day. It is possible that her diarrhea has resulted in   B. Given her many current problems, some of which are due to RMSF, it is difficult to determine by history whether or not she is clinically still hypothyroid or is euthyroid on her LT4 dose of 125 mcg/day.   C. Her elevated MCV suggests that she may have some element of Vitamin B12 or folate deficiency.   D. We still do not know why she has diarrhea. Dr. Sherlon [name] has sent off a stool sample to assess what may be causing her diarrhea. [name] feels that her diarrhea worsened after taking the recent course of antibiotics. 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 5. Plan: Our nurses sent her a lab test order for TFTs to be drawn at Dr. Sherlon [name]' office on November 7th and then sent to [facility] in [place]. She also needs folate and B12 levels drawn. I'll ask  our nurses to send her a lab slip for the vitamin B12 and folate. Send me the lab results as soon as they are available.   [name] [name],[name] [name]

## 2014-12-29 ENCOUNTER — Other Ambulatory Visit: Payer: Self-pay | Admitting: *Deleted

## 2014-12-29 DIAGNOSIS — E034 Atrophy of thyroid (acquired): Secondary | ICD-10-CM

## 2015-01-25 ENCOUNTER — Ambulatory Visit (INDEPENDENT_AMBULATORY_CARE_PROVIDER_SITE_OTHER): Payer: Medicare PPO | Admitting: "Endocrinology

## 2015-01-25 ENCOUNTER — Encounter: Payer: Self-pay | Admitting: "Endocrinology

## 2015-01-25 VITALS — BP 119/72 | HR 59 | Wt 177.9 lb

## 2015-01-25 DIAGNOSIS — E049 Nontoxic goiter, unspecified: Secondary | ICD-10-CM | POA: Diagnosis not present

## 2015-01-25 DIAGNOSIS — R197 Diarrhea, unspecified: Secondary | ICD-10-CM

## 2015-01-25 DIAGNOSIS — N2581 Secondary hyperparathyroidism of renal origin: Secondary | ICD-10-CM

## 2015-01-25 DIAGNOSIS — E669 Obesity, unspecified: Secondary | ICD-10-CM

## 2015-01-25 DIAGNOSIS — E890 Postprocedural hypothyroidism: Secondary | ICD-10-CM | POA: Diagnosis not present

## 2015-01-25 DIAGNOSIS — I10 Essential (primary) hypertension: Secondary | ICD-10-CM

## 2015-01-25 DIAGNOSIS — E063 Autoimmune thyroiditis: Secondary | ICD-10-CM

## 2015-01-25 DIAGNOSIS — R1013 Epigastric pain: Secondary | ICD-10-CM

## 2015-01-25 MED ORDER — LEVOTHYROXINE SODIUM 125 MCG PO TABS: 125.0000 ug | ORAL_TABLET | Freq: Every day | ORAL | Status: DC

## 2015-01-25 NOTE — Progress Notes (Signed)
CC: Follow-up hypothyroid s/p I-131, hypertension, goiter, dyspepsia, fatigue, GERD, Hashimoto's disease, status post CVA, right hemiparesis, hyperlipidemia, smoking, and supraclavicular fullness    History of present illness: The patient is a 54 year old Caucasian woman.   1. The patient first presented to me on 08/10/04 in referral from her primary care provider, Dr. Frederica Kuster in [place], [place], for evaluation and management of hyperthyroidism. In retrospect, the patient had chronic problems with infected sinuses, inner ear symptoms, and spinning dizziness. She also had complaints of severe fatigue and weight gain. Dr. Jonette Eva had performed thyroid tests on 06/14/04. TSH was 0.13, free T4 1.04. Thyroid ultrasound showed small and large nodules in a thyroid gland that  was somewhat enlarged overall. On examination she had a 20+ gram thyroid gland. Thyroid gland was firm, but nontender. She also had some diastolic hypertension. Thyroid tests showed a TSH of 0.437, free T4 1.01, and free T3 of 2.6. Her TPO antibody level was 29 9. Her TSI level was 98. It appeared at that time that the patient likely had some elevation of thyroid hormone due to Hashimoto's disease or had a combination of intermittently active Hashimoto's Disease and intermittently active  Graves' Disease. During the next 2 years, her TSH values varied from 0.350-0.854. By February of 2008, however,her TSH dropped to 0.008, her free T4 had increased to 2.08, and her free T3 and increased to 5.8. She was clearly hyperthyroid and thyrotoxic. TSI level at that point was 1.8 with normal being 1.3 or less. On 05/25/06 she underwent radioactive iodine treatment with I-131. She subsequently became hypothyroid and was started on Synthroid replacement therapy. Since then her doses of Synthroid have been adjusted several times.   2. During the past 10 years the patient has had several other medical problems:  A. She has had one or more CVAs/TIAs, which  have resulted in some mild right hemiparesis and some transient memory losses. Her MRI of the brain in February 2010 showed evidence for remote infarcts involving the posterior right temporal lobe and parietal lobe.    B. She also had a 3-4 month episode of spontaneous galactorrhea in 2010. Brain MRI showed a 2 x 3 mm area of delayed enhancement seen posteriorly on the left side of her pituitary gland. I felt at the time that it would be very uncharacteristic for a true prolactinoma this small to actually produce hyperprolactinemia and clinical galactorrhea. The galactorrhea subsequently resolved spontaneously.   C. On 08/02/2010 she presented with increased swelling of both supraclavicular areas. A CT scan was performed which showed no significant pathology. She was evaluated by Dr. Christia Reading of [facility]. Dr. Jenne Pane could not identify any cause of the supraclavicular area swelling other than obesity. That swelling has since waxed and waned with her changes in weight.    D. The patient was started on Crestor for hyperlipidemia, but initially could not tolerate even the 5 mg dose more than twice per week. She was subsequently started on Zetia, 10 mg/day, which she tolerated better, but did not control her lipids as well. Crestor was later re-started at a lower dose and she was able to tolerate that medication. However, prior to her last visit she again discontinued the Crestor due to symptoms.   E. She has had multiple GI complaints over time, to include severe, chronic diarrhea, heartburn, and abdominal pains. In the Summer of 2015 she saw a gastroenterologist who performed a colonoscopy and removed a polyp. He also put her on a gluten-free  and casein-free diet. Her symptoms improved on that diet.   3. The patient's last [facility] visit was on 09/24/14. I continued her Synthroid dose of 100 mcg/day. Ms. Sherlon HandingRodriguez subsequently added generic levothyroxine, 25 mcg/day and vitamin D. Ms. Thomasena EdisCollins discontinued the  omeprazole due to concerns about possible osteoporosis.     A. In the interim she has been fairly healthy, but still has her aches and pains. She has not had any further acute problems with kidney stones, TIAs, or anterior hemi-circumferential neck swelling that gave her the sensation of difficulty breathing.   B.   She really does not consistently follow her gluten-free and casein-free diet. She has been having more [place] bread and has not had any change in symptoms.  Her diarrhea still comes and goes.    C. She takes Plavix, 75 mg/day; She takes Centrum Silver daily, but stopped taking vitamin D and calcium pills.  She is still smoking. She still loves to eat and really pretty much eats what she wants to eat when she wants to eat it.   3. Pertinent Review of Systems: Constitutional: She feels "pretty good, but tired". "My bones (joints) hurt". Some days she feels more tired than on other days. She has watery/mushy stools most days, from 1-5 times per day. She snores a lot and sometimes wakes up choking or having trouble catching her breath.  Eyes: Near vision is not as good. She has to use her bifocals much more. She had an eye exam 9 months ago. She was told that she had the beginning stages of macular degeneration. She also had the beginning of cataracts.  Neck: Her allergies are acting up again and she has postnasal drainage now. She still has occasional problems swallowing when her allergies are worse or when she eats certain spices. 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 She has no other complaints of anterior neck swelling, soreness, tenderness, pressure, discomfort, or difficulty swallowing. The swelling of the supraclavicular fossae has not been a problem recently.       Heart: She rarely has some brief palpitations. 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 Her cardiologist confirmed that diagnosis at her  follow up appointment in September or October 2016. The patient has no other complaints of irregular heat beats, chest pain, or chest pressure. Gastrointestinal: As above. Her GERD and heartburn are better when she takes her omeprazole even intermittently, but she again stopped taking the omeprazole due to concerns that she might get osteoporosis. Spicy foods and soft drinks hurt her even more. She has diarrhea more often than constipation.  Legs: Muscle mass and strength seem unchanged. Her right side remains somewhat weaker than her left. She has had more sciatica recently. She notes some burning of her calves at times. She also has some edema of the lower calves and ankles at times.  Feet: She sometimes has numbness and burning in her feet. She notes some edema at times. Her right leg has become stronger over time, so she rarely drags her right foot anymore.  Neuro: She has persistent bilateral numbness in her thumbs and first two fingers bilaterally.  Mental/psychological: "I'm fine. I ain't eating ketchup yet."  Her memory and abilities to think, to pay attention, and to make decisions seem better.   REVIEW OF SYSTEMS: There are no other significant problems involving her other body systems.  PAST MEDICAL, FAMILY,  AND SOCIAL HISTORY:  1. Family: She still lives alone. She has more social interactions now with friends and family. She and her sister still encourage each other to eat out too often and eat too much of the wrong things. She has two sisters with hypothyroidism. Her daughter is also having thyroid problems. 2. Activities: She has not been swimming or walking very much.  3. Tobacco: She is still smoking.   4. Primary care provider: She sees Donnita Falls, NP in [place].  5. Neurologist: Dr. Lacey Jensen, [facility], phone [phone]  PHYSICAL EXAM: BP: 119/72, HR 59, weight 177 lbs,14.4 oz She has gained almost 3 pounds since last visit. Constitutional: The  patient looks good today. Her ironic sense of humor is very good. Her insight and affect are normal. Her memory is much better than it was one year ago. She is quite mentally sharp today.  Eyes: There is no arcus or proptosis.  Mouth: The oropharynx appears normal. The tongue appears normal. There is normal oral moisture. There is no obvious gingivitis. Neck: There are no bruits present. The thyroid gland appears normal in size. The thyroid gland is approximately 18 grams in size. Both lobes are within normal size today. The consistency of the thyroid gland is normal. There is no thyroid tenderness to palpation. Her bilateral supraclavicular fullness has decreased today.   Lungs: The lungs are clear. Air movement is good. Heart: The heart rhythm and rate appear normal. Heart sounds S1 and S2 are normal. I do not appreciate any pathologic heart murmurs. Abdomen: The abdomen is enlarged. Bowel sounds are normal. The abdomen is soft and non-tender. There is no obviously palpable hepatomegaly, splenomegaly, or other masses. She is not tender today.   Arms: Muscle mass appears appropriate for age.  Hands: She has 1+ tremor bilaterally. Phalangeal and metacarpophalangeal joints appear normal. Palms are normal. Legs: Muscle mass appears appropriate for age. There is no edema today.  Neurologic: Right hand grip is slightly weaker than the left handgrip. Right hip flexors are somewhat weaker than the left hip flexors. Sensation to touch is intact in her legs.     LABS:   Labs: 01/12/15: TSH 0.885, free T4 1.88, free T3 3.01; CBC: Normal except MCV 98; CMP normal; vitamin B12 (344 (normal 239-931), folic acid 19.7 (normal >5.4); PTH 80.49 (normal 12-72), calcium 9.10 (normal 8.5-10.6), 25-OH vitamin D 32.6; cholesterol 231, triglycerides 83, HDL 66, LDL 148  09/24/14: Ordered, but not performed  05/21/14: TSH 0.490, free T4 1.46, free T3 2.4; HbA1C 5.8%; calcium 9.3, PTH 52; 25-OH vitamin D 40; cholesterol  246, triglycerides 92, HDL 70, LDL 176; calcitonin <2  02/09/14: TSH 2.96, free T4 1.43, free T3 2.2  02/05/14: Urinalysis: large blood, 15 ketones; alkaline phosphatase 108 (normal 32-92);   02/02/14: Urinalysis: trace blood, no ketones; Hgb 13.6, Hct 43.9, MCV 100.3; CMP normal, with alkaline phosphatase 16 (normal 40-129); TSH 3.03; B12 305 (211-946); cholesterol 282, triglycerides 68, HDL 79, LDL 189; 25-hydroxy vitamin D >60  07/11/13: B12 236 (normal 211-911), calcitonin < 2 (normal < 6)  03/10/13: CMP normal. TSH 1.456, free T4 1.36, free T3 2.6; cholesterol 194, triglycerides 82, HDL 72, LDL 106  11/18/13: TSH .539, free T4 1.30, free T3 2.6  10/16/12: CMP normal; TSH 2.20, free T4 1.10, free T3 2.2; cholesterol 202, triglycerides 77, HDL 82, LDL 105  06/18/12: TSH 1.260, free T4 1.58, free T3 2.5, cholesterol 211, triglycerides 71, HDL 78, LDL 119  02/13/12: TSH  0.235, free T4 1.83, cholesterol 236, triglycerides 85, HDL 68, LDL 151. Had been back on Crestor for about 3 weeks, but was off it for about 7 days prior to the labs being drawn.   01/09/12: AST 45, ALT 70, cholesterol 298, triglycerides 85, HDL 72, LDL 209, TSH 0.44, free T4 1.52, 25-hydroxy,  vitamin D 41. Had been off Crestor for months.   09/06/11: TSH 0.663, free T4 1.65, T3 117, cholesterol 225, triglycerides 65, HDL 84, LDL 128  05/09/11: TSH 1.700. Free T4: 1.66. CMP is normal. Cholesterol 214, triglycerides 67, HDL was 73, LDL 128. 25-OH-vitamin D 41.7. She started Zetia as of  05/15/11.  ASSESSMENT: 1. Supraclavicular fullness: The swelling in the supraclavicular fossae has improved over time. It appears that the swelling is just fatty tissue.    2. Obesity: Her weight is higher. Her GI problems and her arthritis make it difficult for her to exercise.       3. Hypertension: BP is good today. She still needs daily exercise.  3. Hypothyroid, s/p I-131:   A. She was euthyroid in January 2015.  She was borderline  hyperthyroid in March of this year.   B. Her recent TFTs were essentially normal, although her free T4 is a bit elevated. She appears to need the Synthroid dose of 125 mcg/day.  4. Hypercholesterolemia: The patient's most recent total cholesterol and LDL were better than in March. She continues to take fish oil, which seems to be helping her over time.  5. Goiter: Thyroid gland is again within normal limits for size today.   6. Thyroiditis: Hashimoto's disease is clinically quiescent today. The non-physiologic shifts in her TFTs at times are c/w intermittent Hashimoto's Dz activity. 7. Dyspepsia/GERD: Her dyspepsia and GERD vary depending upon her diet and her compliance with taking omeprazole and her tobacco intake.    8. Tobacco: At this point in time she is not motivated to stop smoking. We will continue to encourage her. I told her that she is fortunate that she has been abe to recover so well from her previous stroke(s). She may not be so lucky the next time. If she wants to preserve her brain tissue and her abilities to be active and to live  Independently, she needs to stop smoking.  9. Edema: She does not have any edema today. She sometimes takes in more salt than she needs.  10. 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 When she was on the diet her diarrhea stopped. Her calcitonin was negative, ruling out hypercalcitoninemia as a cause of her diarrhea.  11. Excess vitamin D: Her vitamin D levels in March and November were normal.  12. S/p CVA:  Her MRI on 04/16/08 showed CNS defects in the right cerebrum. Her neurologic defects, however, were on the right side of her body. She may have had more CNS damage in her left cerebrum that has been recognized.   13. Hyperparathyroidism, secondary to inadequate calcium intake: I recommended that she take Synthroid at breakfast, two Tums at lunch, her Centrum Silver at dinner, and two Tums at bedtime.   PLAN: 1. Diagnostic: Repeat TFTS,  25-hydroxy vitamin D, PTH, calcium prior to next visit.   2. Therapeutic: Continue Synthroid at 100 mcg pills daily. Continue omeprazole, 20 mg, as needed. Continue the Centrum silver.  Stop smoking. Take Tums as above. 3. Patient education: We discussed her GI problems, her thyroiditis and hypothyroidism, and her neuro issues. I counseled her again about stopping  smoking.  4. Follow-up: Follow up visit in 3 months.  Level of Service: This visit lasted in excess of 50 minutes. More than 50% of the visit was devoted to counseling.  David Stall

## 2015-01-25 NOTE — Patient Instructions (Signed)
Follow up visit in 3 months. Please repeat lab tests one week prior to next visit. 

## 2015-01-27 ENCOUNTER — Encounter: Payer: Self-pay | Admitting: "Endocrinology

## 2015-02-18 NOTE — Telephone Encounter (Signed)
Patient seen in appointment.

## 2015-04-27 ENCOUNTER — Encounter: Payer: Self-pay | Admitting: "Endocrinology

## 2015-04-27 ENCOUNTER — Ambulatory Visit (INDEPENDENT_AMBULATORY_CARE_PROVIDER_SITE_OTHER): Payer: Medicare Other | Admitting: "Endocrinology

## 2015-04-27 VITALS — BP 122/75 | HR 73 | Wt 184.4 lb

## 2015-04-27 DIAGNOSIS — E063 Autoimmune thyroiditis: Secondary | ICD-10-CM

## 2015-04-27 DIAGNOSIS — E049 Nontoxic goiter, unspecified: Secondary | ICD-10-CM

## 2015-04-27 DIAGNOSIS — R7309 Other abnormal glucose: Secondary | ICD-10-CM | POA: Diagnosis not present

## 2015-04-27 DIAGNOSIS — E669 Obesity, unspecified: Secondary | ICD-10-CM

## 2015-04-27 DIAGNOSIS — E039 Hypothyroidism, unspecified: Secondary | ICD-10-CM | POA: Diagnosis not present

## 2015-04-27 DIAGNOSIS — E211 Secondary hyperparathyroidism, not elsewhere classified: Secondary | ICD-10-CM

## 2015-04-27 DIAGNOSIS — I10 Essential (primary) hypertension: Secondary | ICD-10-CM | POA: Diagnosis not present

## 2015-04-27 DIAGNOSIS — K591 Functional diarrhea: Secondary | ICD-10-CM

## 2015-04-27 DIAGNOSIS — R1013 Epigastric pain: Secondary | ICD-10-CM

## 2015-04-27 LAB — POCT GLYCOSYLATED HEMOGLOBIN (HGB A1C): HEMOGLOBIN A1C: 5.5

## 2015-04-27 LAB — GLUCOSE, POCT (MANUAL RESULT ENTRY): POC GLUCOSE: 87 mg/dL (ref 70–99)

## 2015-04-27 NOTE — Patient Instructions (Signed)
Follow up visit in 3 months. 

## 2015-04-27 NOTE — Progress Notes (Signed)
CC: Follow-up hypothyroid s/p I-131, hypertension, goiter, dyspepsia, fatigue, GERD, Hashimoto's disease, status post CVA, right hemiparesis, hyperlipidemia, smoking, and supraclavicular fullness    History of present illness: The patient is a 55 year old Caucasian woman.   1. The patient first presented to me on 08/10/04 in referral from her primary care provider, Dr. Frederica Kuster in [place], [place], for evaluation and management of hyperthyroidism. In retrospect, the patient had chronic problems with infected sinuses, inner ear symptoms, and spinning dizziness. She also had complaints of severe fatigue and weight gain. Dr. Jonette Eva had performed thyroid tests on 06/14/04. TSH was 0.13, free T4 1.04. Thyroid ultrasound showed small and large nodules in a thyroid gland that  was somewhat enlarged overall. On examination she had a 20+ gram thyroid gland. Thyroid gland was firm, but nontender. She also had some diastolic hypertension. Thyroid tests showed a TSH of 0.437, free T4 1.01, and free T3 of 2.6. Her TPO antibody level was 29 9. Her TSI level was 98. It appeared at that time that the patient likely had some elevation of thyroid hormone due to Hashimoto's disease or had a combination of intermittently active Hashimoto's Disease and intermittently active  Graves' Disease.   2. During the next 2 years, her TSH values varied from 0.350-0.854. By February of 2008, however,her TSH dropped to 0.008, her free T4 had increased to 2.08, and her free T3 and increased to 5.8. She was clearly hyperthyroid and thyrotoxic. TSI level at that point was 1.8 with normal being 1.3 or less. On 05/25/06 she underwent radioactive iodine treatment with I-131. She subsequently became hypothyroid and was started on Synthroid replacement therapy. Since then her doses of Synthroid have been adjusted several times.   3. During the past 10 years the patient has had several other medical problems:  A. She has had one or more CVAs/TIAs,  which have resulted in some mild right hemiparesis and some transient memory losses. Her MRI of the brain in February 2010 showed evidence for remote infarcts involving the posterior right temporal lobe and parietal lobe.    B. She also had a 3-4 month episode of spontaneous galactorrhea in 2010. Brain MRI showed a 2 x 3 mm area of delayed enhancement seen posteriorly on the left side of her pituitary gland. I felt at the time that it would be very uncharacteristic for a true prolactinoma this small to actually produce hyperprolactinemia and clinical galactorrhea. The galactorrhea subsequently resolved spontaneously.   C. On 08/02/2010 she presented with increased swelling of both supraclavicular areas. A CT scan was performed which showed no significant pathology. She was evaluated by Dr. Christia Reading of [facility]. Dr. Jenne Pane could not identify any cause of the supraclavicular area swelling other than obesity. That swelling has since waxed and waned with her changes in weight.    D. The patient was started on Crestor for hyperlipidemia, but initially could not tolerate even the 5 mg dose more than twice per week. She was subsequently started on Zetia, 10 mg/day, which she tolerated better, but did not control her lipids as well. Crestor was later re-started at a lower dose and she was able to tolerate that medication. However, prior to her last visit she again discontinued the Crestor due to symptoms.   E. She has had multiple GI complaints over time, to include severe, chronic diarrhea, heartburn, and abdominal pains. In the Summer of 2015 she saw a gastroenterologist who performed a colonoscopy and removed a polyp. He also put her  on a gluten-free and casein-free diet. Her symptoms improved on that diet.   3. The patient's last [facility] visit was on 01/25/15. I continued her Synthroid dose of 125 mcg/day. Ms. Letendre discontinued the omeprazole due to concerns about possible osteoporosis.     A. In the  interim she has been fairly healthy, but still has her aches and pains. She has not had any further acute problems with kidney stones, TIAs, or anterior hemi-circumferential neck swelling that gave her the sensation of difficulty breathing.   B.   She really does not consistently follow her gluten-free and casein-free diet. Her soft, mushy stools and diarrhea still come and go.    C. She takes Plavix, 75 mg/day; She takes Surveyor, quantity daily. She resumed taking vitamin D and calcium. She is still smoking. She still loves to eat and really pretty much eats what she wants to eat when she wants to eat it.   3. Pertinent Review of Systems: Constitutional: She says, "I feel pretty good except for my aches and pains. My joints hurt.". Some days she feels more tired than on other days. She has normal stools some days, but watery or mush stools on other days.  She occasionally snores a lot and sometimes wakes up choking or having trouble catching her breath.  Eyes: Near vision is not as good. She has to use her bifocals much more. She had an eye exam 3 months ago. She was told that she had the beginning stages of macular degeneration. She also had the beginning of cataracts.  Neck: Her allergies are acting up again and she has postnasal drainage now. She still has occasional problems swallowing when her allergies are worse or when she eats certain spices. 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 She has no other complaints of anterior neck swelling, soreness, tenderness, pressure, discomfort, or difficulty swallowing. The swelling of the supraclavicular fossae has not been a problem recently.       Heart: She rarely has some brief palpitations. She notes the palpitations mostly when she is sleeping on her side. 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 Her cardiologist confirmed that diagnosis at her follow up appointment in  September or October 2016. The patient has no other complaints of irregular heat beats, chest pain, or chest pressure. Gastrointestinal: As above. Her GERD and heartburn are better when she takes her omeprazole even intermittently, but she again stopped taking the omeprazole due to concerns that she might get osteoporosis. Spicy foods and soft drinks hurt her even more. She has mushy stools more often than constipation.  Legs: Muscle mass and strength seem unchanged. Her right side remains somewhat weaker than her left. She has had some sciatica recently. She notes some burning of her calves at times. She also has some edema of the lower calves and ankles at times.  Feet: She sometimes has numbness and burning in her feet. She notes some edema at times. Her right leg has become stronger over time, so she rarely drags her right foot anymore.  Neuro: She has persistent bilateral numbness in her thumbs and first two fingers bilaterally.  Mental/psychological: "I haven't eaten any checkers lately."  Her memory and abilities to think, to pay attention, and to make decisions seem better.   REVIEW OF SYSTEMS: There are no other significant problems involving her other body systems.  PAST MEDICAL, FAMILY,  AND SOCIAL HISTORY:  1. Family: She still lives alone. She has more social interactions now with friends and family. She and her sister still encourage each other to eat out too often and eat too much of the wrong things. She has two sisters with hypothyroidism. Her daughter is also having thyroid problems. 2. Activities: She has been swimming more recently.   3. Tobacco: She is still smoking.   4. Primary care provider: She sees Donnita Falls, NP in [place].  5. Neurologist: Dr. Lacey Jensen, [facility], phone [phone]  PHYSICAL EXAM: BP: 122/75, HR 73, weight 184-6, compared with 177 lbs,14.4 oz She has gained 6.5 pounds since last visit. Constitutional: The patient looks  good today. Her sense of humor is wonderful. Her insight and affect are normal. Her memory is much better than it was one year ago. She is quite mentally sharp today.  Eyes: There is no arcus or proptosis.  Mouth: The oropharynx appears normal. The tongue appears normal. There is normal oral moisture. There is no obvious gingivitis. Neck: There are no bruits present. The thyroid gland appears normal in size. The thyroid gland is normal at approximately 18 grams in size. Both lobes are within normal size today. The consistency of the thyroid gland is normal. There is no thyroid tenderness to palpation. Her bilateral supraclavicular fullness has decreased today.   Lungs: The lungs are clear. Air movement is good. Heart: The heart rhythm and rate appear normal. Heart sounds S1 and S2 are normal. I do not appreciate any pathologic heart murmurs. Abdomen: The abdomen is enlarged. Bowel sounds are normal. The abdomen is soft and non-tender. There is no obviously palpable hepatomegaly, splenomegaly, or other masses. She is not tender today.   Arms: Muscle mass appears appropriate for age.  Hands: She has 1+ tremor bilaterally. Phalangeal and metacarpophalangeal joints appear normal. Palms are normal. Legs: Muscle mass appears appropriate for age. There is no edema today.  Neurologic: Right hand grip is slightly weaker than the left handgrip. Right hip flexors are weaker than the left hip flexors. Sensation to touch is intact in her legs.     LABS:   Labs 04/27/15: HbA1c 5.5%  Labs 01/12/15: TSH 0.885, free T4 1.88, free T3 3.01; CBC: Normal except MCV 98; CMP normal; vitamin B12 (344 (normal 239-931), folic acid 19.7 (normal >5.4); PTH 80.49 (normal 12-72), calcium 9.10 (normal 8.5-10.6), 25-OH vitamin D 32.6; cholesterol 231, triglycerides 83, HDL 66, LDL 148  Labs 09/24/14: Ordered, but not performed  Labs 05/21/14: TSH 0.490, free T4 1.46, free T3 2.4; HbA1C 5.8%; calcium 9.3, PTH 52; 25-OH vitamin D  40; cholesterol 246, triglycerides 92, HDL 70, LDL 176; calcitonin <2  Labs 02/09/14: TSH 2.96, free T4 1.43, free T3 2.2  Labs 02/05/14: Urinalysis: large blood, 15 ketones; alkaline phosphatase 108 (normal 32-92);   Labs 02/02/14: Urinalysis: trace blood, no ketones; Hgb 13.6, Hct 43.9, MCV 100.3; CMP normal, with alkaline phosphatase 16 (normal 40-129); TSH 3.03; B12 305 (211-946); cholesterol 282, triglycerides 68, HDL 79, LDL 189; 25-hydroxy vitamin D >60  Labs 07/11/13: B12 236 (normal 211-911), calcitonin < 2 (normal < 6)  Labs 03/10/13: CMP normal. TSH 1.456, free T4 1.36, free T3 2.6; cholesterol 194, triglycerides 82, HDL 72, LDL 106  Labs 11/18/13: TSH 0.539, free T4 1.30, free T3 2.6  Labs 10/16/12: CMP normal; TSH 2.20, free T4 1.10, free T3 2.2; cholesterol 202, triglycerides 77, HDL 82, LDL 105  Labs 06/18/12: TSH 1.260, free T4 1.58,  free T3 2.5, cholesterol 211, triglycerides 71, HDL 78, LDL 119  Labs 02/13/12: TSH 0.235, free T4 1.83, cholesterol 236, triglycerides 85, HDL 68, LDL 151. Had been back on Crestor for about 3 weeks, but was off it for about 7 days prior to the labs being drawn.   Labs 01/09/12: AST 45, ALT 70, cholesterol 298, triglycerides 85, HDL 72, LDL 209, TSH 0.44, free T4 1.52, 25-hydroxy,  vitamin D 41. Had been off Crestor for months.   Labs 09/06/11: TSH 0.663, free T4 1.65, T3 117, cholesterol 225, triglycerides 65, HDL 84, LDL 128  Labs 05/09/11: TSH 1.610. Free T4: 1.66. CMP is normal. Cholesterol 214, triglycerides 67, HDL was 73, LDL 128. 25-OH-vitamin D 41.7. She started Zetia as of  05/15/11.  ASSESSMENT: 1. Supraclavicular fullness: The swelling in the supraclavicular fossae has improved over time. It appears that the swelling is just fatty tissue.    2. Obesity: Her weight is higher. Her GI problems and her arthritis make it difficult for her to exercise. Swimming will help.      3. Hypertension: BP is good today. She still needs daily exercise.   3. Hypothyroid, s/p I-131:   A. She was euthyroid in January 2015.  She was borderline hyperthyroid in March 2016.   B. Her TFTs in November were essentially normal, although her free T4 was a bit elevated. She appeared to need the Synthroid dose of 125 mcg/day.  4. Hypercholesterolemia: The patient's most recent total cholesterol and LDL in November were better than in March. She continues to take fish oil, which seems to be helping her over time.  5. Goiter: Thyroid gland is again within normal limits for size today.   6. Thyroiditis: Hashimoto's disease is clinically quiescent today. The non-physiologic shifts in her TFTs at times are c/w intermittent Hashimoto's Dz activity. 7. Dyspepsia/GERD: Her dyspepsia and GERD vary depending upon her diet and her compliance with taking omeprazole and her tobacco intake.    8. Tobacco: At this point in time she is not motivated to stop smoking. I will continue to encourage her. I told her that she is fortunate that she has been abe to recover so well from her previous stroke(s). She may not be so lucky the next time. If she wants to preserve her brain tissue and her abilities to be active and to live  Independently, she needs to stop smoking.  9. Edema: She does not have any edema today. She sometimes takes in more salt than she needs.  10. 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 When she was on the diet her diarrhea stopped. Her calcitonin was negative, ruling out hypercalcitoninemia as a cause of her diarrhea. Her diarrhea is not severe enough for her to resume her gluten-free and casein-free diet. 11. Excess vitamin D: Her vitamin D levels in March and November were normal.  12. S/p CVA:  Her MRI on 04/16/08 showed CNS defects in the right cerebrum. Her neurologic defects, however, were on the right side of her body. She may have had more CNS damage in her left cerebrum that has been recognized.  She seems to have recovered fairly well, but  still has some right hemiparetic findings. 13. Hyperparathyroidism, secondary to inadequate calcium intake: I recommended that she take Synthroid at breakfast, two Tums at lunch, her Centrum Silver at dinner, and two Tums at bedtime.   PLAN: 1. Diagnostic: Repeat TFTS, 25-hydroxy vitamin D, PTH, calcium prior to next visit if needed  2. Therapeutic: Continue Synthroid at 125 mcg pills daily. Resume omeprazole, 20 mg, to offset the effects of her nicotine. Continue the Centrum silver.  Stop smoking. Take Tums as above. 3. Patient education: We discussed her GI problems, her thyroiditis and hypothyroidism, and her neuro issues. I counseled her again about stopping smoking.  4. Follow-up: Follow up visit in 3 months.  Level of Service: This visit lasted in excess of 50 minutes. More than 50% of the visit was devoted to counseling.  David Stall

## 2015-04-28 LAB — LIPID PANEL
Cholesterol: 258 mg/dL — ABNORMAL HIGH (ref 125–200)
HDL: 77 mg/dL (ref >=46)
LDL CALC: 162 mg/dL — AB (ref <130)
Total CHOL/HDL Ratio: 3.4 Ratio (ref <=5.0)
Triglycerides: 96 mg/dL (ref <150)
VLDL: 19 mg/dL (ref <30)

## 2015-04-28 LAB — T3, FREE: T3, Free: 2.2 pg/mL — ABNORMAL LOW (ref 2.3–4.2)

## 2015-04-28 LAB — VITAMIN D 25 HYDROXY (VIT D DEFICIENCY, FRACTURES): VIT D 25 HYDROXY: 33 ng/mL (ref 30–100)

## 2015-04-28 LAB — T4, FREE: FREE T4: 1.4 ng/dL (ref 0.8–1.8)

## 2015-04-28 LAB — PTH, INTACT AND CALCIUM
CALCIUM: 10 mg/dL (ref 8.4–10.5)
PTH: 32 pg/mL (ref 14–64)

## 2015-04-28 LAB — TSH: TSH: 3.61 m[IU]/L

## 2015-05-07 ENCOUNTER — Encounter: Payer: Self-pay | Admitting: *Deleted

## 2015-07-29 ENCOUNTER — Encounter: Payer: Self-pay | Admitting: *Deleted

## 2015-07-29 ENCOUNTER — Encounter: Payer: Self-pay | Admitting: "Endocrinology

## 2015-07-29 ENCOUNTER — Ambulatory Visit (INDEPENDENT_AMBULATORY_CARE_PROVIDER_SITE_OTHER): Payer: Medicare Other | Admitting: "Endocrinology

## 2015-07-29 VITALS — BP 111/78 | HR 77 | Wt 184.6 lb

## 2015-07-29 DIAGNOSIS — E063 Autoimmune thyroiditis: Secondary | ICD-10-CM | POA: Diagnosis not present

## 2015-07-29 DIAGNOSIS — Z720 Tobacco use: Secondary | ICD-10-CM

## 2015-07-29 DIAGNOSIS — E669 Obesity, unspecified: Secondary | ICD-10-CM | POA: Diagnosis not present

## 2015-07-29 DIAGNOSIS — E049 Nontoxic goiter, unspecified: Secondary | ICD-10-CM | POA: Diagnosis not present

## 2015-07-29 DIAGNOSIS — E038 Other specified hypothyroidism: Secondary | ICD-10-CM

## 2015-07-29 DIAGNOSIS — E7800 Pure hypercholesterolemia, unspecified: Secondary | ICD-10-CM

## 2015-07-29 DIAGNOSIS — R1013 Epigastric pain: Secondary | ICD-10-CM

## 2015-07-29 DIAGNOSIS — E211 Secondary hyperparathyroidism, not elsewhere classified: Secondary | ICD-10-CM

## 2015-07-29 MED ORDER — LEVOTHYROXINE SODIUM 137 MCG PO TABS: 137.0000 ug | ORAL_TABLET | Freq: Every day | ORAL | Status: DC

## 2015-07-29 NOTE — Patient Instructions (Addendum)
Follow up visit in 3 months. Please change the time of your fish oil to dinner. Please repeat the lab tests 1-2 weeks prior to next visit. To use up the Synthroid 125 pills, take one pill 5 days per week and 1.5 pills on two days per week, such as Wednesdays and Sundays.

## 2015-07-29 NOTE — Progress Notes (Addendum)
CC: Follow-up hypothyroid s/p I-131, hypertension, goiter, dyspepsia, fatigue, GERD, Hashimoto's disease, status post CVA, right hemiparesis, hyperlipidemia, smoking, and supraclavicular fullness    History of present illness: The patient is a 55 year old Caucasian [name] [name].   1. The patient first presented to me on 08/10/04 in referral from her primary care provider, Dr. Frederica Kuster in [place], [place], for evaluation and management of hyperthyroidism. In retrospect, the patient had chronic problems with infected sinuses, inner ear symptoms, and spinning dizziness. She also had complaints of severe fatigue and weight gain. Dr. Jonette Eva had performed thyroid tests on 06/14/04. TSH was 0.13, free T4 1.04. Thyroid ultrasound showed small and large nodules in a thyroid gland that was somewhat enlarged overall. On examination she had a 20+ gram thyroid gland. Thyroid gland was firm, but nontender. She also had some diastolic hypertension. Thyroid tests showed a TSH of 0.437, free T4 1.01, and free T3 of 2.6. Her TPO antibody level was 29 9. Her TSI level was 98. It appeared at that time that the patient likely had some elevation of thyroid hormone due to Hashimoto's disease or had a combination of intermittently active Hashimoto's Disease and intermittently active  Graves' Disease.   2. During the next 2 years, her TSH values varied from 0.350-0.854. By February of 2008, however, her TSH dropped to 0.008, her free T4 increased to 2.08, and her free T3 increased to 5.8. She was clearly hyperthyroid and thyrotoxic. TSI level at that point was 1.8 with normal being 1.3 or less. On 05/25/06 she underwent radioactive iodine treatment with I-131. She subsequently became hypothyroid and was started on Synthroid replacement therapy. Since then her doses of Synthroid have been adjusted several times.   3. During the past 9 years the patient has had several other medical problems:  A. She has had one or more CVAs/TIAs, which  have resulted in some mild right hemiparesis and some transient memory losses. Her MRI of the brain in February 2010 showed evidence for remote infarcts involving the posterior right temporal lobe and parietal lobe.    B. She also had a 3-4 month episode of spontaneous galactorrhea in 2010. Brain MRI showed a 2 x 3 mm area of delayed enhancement seen posteriorly on the left side of her pituitary gland. I felt at the time that it would be very uncharacteristic for a true prolactinoma this small to actually produce hyperprolactinemia and clinical galactorrhea. The galactorrhea subsequently resolved spontaneously.   C. On 08/02/2010 she presented with increased swelling of both supraclavicular areas. A CT scan was performed which showed no significant pathology. She was evaluated by Dr. Christia Reading of [facility]. Dr. Jenne Pane could not identify any cause of the supraclavicular area swelling other than obesity. That swelling has since waxed and waned with her changes in weight.    D. The patient was started on Crestor for hyperlipidemia, but initially could not tolerate even the 5 mg dose more than twice per week. She was subsequently started on Zetia, 10 mg/day, which she tolerated better, but did not control her lipids as well. Crestor was later re-started at a lower dose and she was able to tolerate that medication. However, prior to her last visit she again discontinued the Crestor due to symptoms.   E. She has had multiple GI complaints over time, to include severe, chronic diarrhea, heartburn, and abdominal pains. In the Summer of 2015 she saw a gastroenterologist who performed a colonoscopy and removed a polyp. He also put her on a  gluten-free and casein-free diet. Her symptoms improved on that diet.   3. The patient's last [facility] visit was on 04/27/15. I continued her Synthroid dose of 125 mcg/day. I asked her to resume the omeprazole and continue her Tums and Centrum Silver, but she did not resume the  omeprazole.   A. In the interim she has been fairly healthy, but still has her aches and pains. She has not had any further acute problems with kidney stones, TIAs, or anterior hemi-circumferential neck swelling that gave her the sensation of difficulty breathing .   B.   She was bitten by a tick about 4 days ago. Her tests for RMSF were negative.   C. She really does not consistently follow her gluten-free and casein-free diet.  D. She takes Plavix, 75 mg/day; She takes Surveyor, quantity daily. She resumed taking vitamin D and calcium. She is still smoking. She still loves to eat and really pretty much eats what she wants to eat when she wants to eat it. She takes her fish oil at bedtime.   3. Pertinent Review of Systems: Constitutional: She says, "I ache, and I hurt, and I'm scatter brained." She is tired a lot. She occasionally snores a lot and sometimes wakes up choking or having trouble catching her breath. Her allergies are acting up again, so she has postnasal drainage now. She still has occasional problems swallowing when her allergies are worse or when she eats certain spices Eyes: Near vision is better with her new glasses. She has to use her bifocals much more often. She had an eye exam 6 months ago. She was told that she had the beginning stages of macular degeneration. She also had the beginning of cataracts. She will have a follow up test on 07/31/15. Neck: She was told in about 2010 or 2011 that her UGI showed a spastic esophagus. She has no other complaints of anterior neck swelling, soreness, tenderness, pressure, discomfort, or difficulty swallowing. The swelling of the supraclavicular fossae has not been a problem recently.       Heart: She rarely has some brief palpitations. She notes the palpitations mostly when she is sleeping on her side. 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 Her cardiologist confirmed that diagnosis at her follow up  appointment in September or October 2016. The patient has no other complaints of irregular heat beats, chest pain, or chest pressure. Gastrointestinal: She has normal stools some days, but watery or mushy stools on other days. Her GERD and heartburn were better in the past when she took her omeprazole even intermittently.  Legs: Muscle mass and strength seem unchanged. Her right side remains somewhat weaker than her left. She has had some sciatica recently. She notes some burning of her calves at times. She also has some edema of the lower calves and ankles at times.  Feet: She sometimes has numbness, burning, and "tickling" in her feet. She notes some edema at times. Her right leg has become stronger over time, so she rarely drags her right foot anymore.  Neuro: She has persistent bilateral numbness and in her thumbs and first two fingers bilaterally.  Mental/psychological: "I haven't eaten any checkers or bounced any checks lately."  Her memory and abilities to think, to pay attention, and to make decisions seem better.   REVIEW OF SYSTEMS: There are no other significant problems involving her other body systems.  PAST MEDICAL, FAMILY, AND SOCIAL HISTORY:  1. Family: She still lives alone. She has more social interactions now with friends and family. She and her sister still encourage each other to eat out too often and eat too much of the wrong things. She has two sisters with hypothyroidism. Her daughter is also having thyroid problems. 2. Activities: She has not been swimming recently.   3. Tobacco: She is still smoking.   4. Primary care provider: She sees Donnita FallsWendy Rodriguez, [place] in [place].  5. Neurologist: Dr. Lacey JensenVictor Owusu, [facility], phone [phone]  PHYSICAL EXAM: BP: 111/78, HR 77, weight 184 pounds and 9.6 ounces. Her weight has been stable.  Constitutional: The patient looks good today. Her sense of humor is great. Her insight and affect are normal. Her memory is  much better than it was one year ago. She is quite mentally sharp today.  Eyes: There is no arcus or proptosis.  Mouth: The oropharynx appears normal. The tongue appears normal. There is normal oral moisture. There is no obvious gingivitis. Neck: There are no bruits present. The thyroid gland appears normal in size. The thyroid gland is normal at approximately 18 grams in size. Both lobes are within normal size today. The consistency of the thyroid gland is normal. There is no thyroid tenderness to palpation. Her bilateral supraclavicular fullness has decreased today.   Lungs: The lungs are clear. Air movement is good. Heart: The heart rhythm and rate appear normal. Heart sounds S1 and S2 are normal. I do not appreciate any pathologic heart murmurs. Abdomen: The abdomen is enlarged. Bowel sounds are normal. The abdomen is soft and non-tender. There is no obviously palpable hepatomegaly, splenomegaly, or other masses. She is not tender today.   Arms: Muscle mass appears appropriate for age.  Hands: She has 1+ tremor bilaterally. Phalangeal and metacarpophalangeal joints appear normal. Palms are normal. Legs: Muscle mass appears appropriate for age. There is no edema today.  Neurologic: Right hand grip is slightly weaker than the left handgrip. Right hip flexors are weaker than the left hip flexors. Sensation to touch is intact in her legs.     LABS:   Labs 07/21/15: TSH 5.01, free T4 1.45, TPO antibody <1, anti-thyroglobulin antibody <1; CMP normal; CBC normal  Labs 04/27/15: HbA1c 5.5%; TSH 3.61, free T4 1.4, free T3 2.2; cholesterol 258, triglycerides 96, HDL 77, LDL 162; PTH 32, calcium 10.0, 25-OH vitamin d 33  Labs 01/12/15: TSH 0.885, free T4 1.88, free T3 3.01; CBC: Normal except MCV 98; CMP normal; vitamin B12 (344 (normal 239-931), folic acid 19.7 (normal >5.4); PTH 80.49 (normal 12-72), calcium 9.10 (normal 8.5-10.6), 25-OH vitamin D 32.6; cholesterol 231, triglycerides 83, HDL 66, LDL  148  Labs 09/24/14: Ordered, but not performed  Labs 05/21/14: TSH 0.490, free T4 1.46, free T3 2.4; HbA1C 5.8%; calcium 9.3, PTH 52; 25-OH vitamin D 40; cholesterol 246, triglycerides 92, HDL 70, LDL 176; calcitonin <2  Labs 02/09/14: TSH 2.96, free T4 1.43, free T3 2.2  Labs 02/05/14: Urinalysis: large blood, 15 ketones; alkaline phosphatase 108 (normal 32-92);   Labs 02/02/14: Urinalysis: trace blood, no ketones; Hgb 13.6, Hct 43.9, MCV 100.3; CMP normal, with alkaline phosphatase 16 (normal 40-129); TSH 3.03; B12 305 (211-946); cholesterol 282, triglycerides 68, HDL 79, LDL 189; 25-hydroxy vitamin D >60  Labs 07/11/13: B12 236 (normal 211-911), calcitonin < 2 (normal < 6)  Labs 03/10/13: CMP normal. TSH 1.456, free T4 1.36, free T3 2.6; cholesterol 194, triglycerides 82, HDL 72, LDL 106  Labs 11/18/13: TSH 0.539,  free T4 1.30, free T3 2.6  Labs 10/16/12: CMP normal; TSH 2.20, free T4 1.10, free T3 2.2; cholesterol 202, triglycerides 77, HDL 82, LDL 105  Labs 06/18/12: TSH 1.260, free T4 1.58, free T3 2.5, cholesterol 211, triglycerides 71, HDL 78, LDL 119  Labs 02/13/12: TSH 0.235, free T4 1.83, cholesterol 236, triglycerides 85, HDL 68, LDL 151. Had been back on Crestor for about 3 weeks, but was off it for about 7 days prior to the labs being drawn.   Labs 01/09/12: AST 45, ALT 70, cholesterol 298, triglycerides 85, HDL 72, LDL 209, TSH 0.44, free T4 1.52, 25-hydroxy,  vitamin D 41. Had been off Crestor for months.   Labs 09/06/11: TSH 0.663, free T4 1.65, T3 117, cholesterol 225, triglycerides 65, HDL 84, LDL 128  Labs 05/09/11: TSH 1.610. Free T4: 1.66. CMP is normal. Cholesterol 214, triglycerides 67, HDL was 73, LDL 128. 25-OH-vitamin D 41.7. She started Zetia as of  05/15/11.  ASSESSMENT: 1. Supraclavicular fullness: The swelling in the supraclavicular fossae has improved over time. It appears that the swelling is just fatty tissue.    2. Obesity: Her weight is still too high. Her GI  problems and her arthritis make it difficult for her to exercise. Swimming would help if she would do it.     3. Hypertension: BP is good today. She still needs daily exercise.  3. Hypothyroid, s/p I-131:   A. She was euthyroid in January 2015.  She was borderline hyperthyroid in March 2016. Her TFTs in November 2016 were essentially normal, although her free T4 was a bit elevated. Her TFTs in February 2017 were borderline low.   B. Her most recent TFTs were lower. We have not been able to identify any substance that is interfering with her Synthroid absorption. I wonder if her intermittent intestinal problems are causing intermittent malabsorption of Synthroid. I also wonder if taking her fish oil at bedtime might be interfering.  4. Hypercholesterolemia: The patient's most recent total cholesterol and LDL in February 2017 were higher than in November 2016. She continues to take fish oil, which seems to be helping her over time. Her recent increases in total and LDL cholesterol may be due to her hypothyroidism. 5. Goiter: Thyroid gland is again within normal limits for size today.   6. Thyroiditis: Hashimoto's disease is clinically quiescent today. The non-physiologic shifts in her TFTs at times are c/w intermittent Hashimoto's Dz activity. 7. Dyspepsia/GERD: Her dyspepsia and GERD vary depending upon her diet and her compliance with taking omeprazole and her tobacco intake.    8. Tobacco: At this point in time she is not motivated to stop smoking. I will continue to encourage her to stop. I told her that she is fortunate that she has been abe to recover so well from her previous stroke(s). She may not be so lucky the next time. If she wants to preserve her brain tissue and her abilities to be active and to live  Independently, she needs to stop smoking.  9. Edema: She does not have any edema today. She sometimes takes in more salt than she needs.  10. Diarrhea: She may have had a diet-related form of  colitis. When she was on the diet her diarrhea stopped. Her calcitonin was negative, ruling out hypercalcitoninemia as a cause of her diarrhea. She feels that her diarrhea and soft stools are not severe enough for her to resume her gluten-free and casein-free diet. 11. Excess vitamin D: Her vitamin D  levels in March and November 2016 and again in February 2017 were normal.  12. S/p CVA:  Her MRI on 04/16/08 showed CNS defects in the right cerebrum. Her neurologic defects, however, were on the right side of her body. She may have had more CNS damage in her left cerebrum that has been recognized.  She seems to have recovered fairly well, but still has some right hemiparetic findings. 13. Hyperparathyroidism, secondary to inadequate calcium intake: At last visit I recommended that she take Synthroid at breakfast, two Tums at lunch, her Centrum Silver at dinner, and two Tums at bedtime. Her calcium is now at about the 75% of the normal range.   PLAN: 1. Diagnostic: Repeat TFTS 2. Therapeutic: Increase Synthroid to 137 mcg/day. Change the time of fish oil to dinner.  Resume omeprazole, 20 mg, to offset the effects of her nicotine. Continue the Centrum silver.  Stop smoking. Take Tums as above. 3. Patient education: We discussed her GI problems, her thyroiditis and hypothyroidism, and her neuro issues. I counseled her again about stopping smoking.  4. Follow-up: Follow up visit in 3 months.  Level of Service: This visit lasted in excess of 50 minutes. More than 50% of the visit was devoted to counseling.  David Stall

## 2015-09-27 DIAGNOSIS — K5730 Diverticulosis of large intestine without perforation or abscess without bleeding: Secondary | ICD-10-CM | POA: Insufficient documentation

## 2015-09-27 DIAGNOSIS — K635 Polyp of colon: Secondary | ICD-10-CM | POA: Insufficient documentation

## 2015-10-11 ENCOUNTER — Encounter: Payer: Self-pay | Admitting: "Endocrinology

## 2015-11-01 ENCOUNTER — Ambulatory Visit: Payer: Medicare Other | Admitting: "Endocrinology

## 2015-11-17 ENCOUNTER — Ambulatory Visit: Payer: Medicare Other | Admitting: "Endocrinology

## 2015-12-09 ENCOUNTER — Ambulatory Visit: Payer: Medicare Other | Admitting: "Endocrinology

## 2016-01-21 ENCOUNTER — Encounter (INDEPENDENT_AMBULATORY_CARE_PROVIDER_SITE_OTHER): Payer: Self-pay | Admitting: "Endocrinology

## 2016-01-21 ENCOUNTER — Ambulatory Visit (INDEPENDENT_AMBULATORY_CARE_PROVIDER_SITE_OTHER): Payer: Medicare Other | Admitting: "Endocrinology

## 2016-01-21 VITALS — BP 123/76 | HR 71 | Wt 180.0 lb

## 2016-01-21 DIAGNOSIS — E7800 Pure hypercholesterolemia, unspecified: Secondary | ICD-10-CM

## 2016-01-21 DIAGNOSIS — E211 Secondary hyperparathyroidism, not elsewhere classified: Secondary | ICD-10-CM | POA: Diagnosis not present

## 2016-01-21 DIAGNOSIS — K591 Functional diarrhea: Secondary | ICD-10-CM

## 2016-01-21 DIAGNOSIS — R7309 Other abnormal glucose: Secondary | ICD-10-CM

## 2016-01-21 DIAGNOSIS — E890 Postprocedural hypothyroidism: Secondary | ICD-10-CM | POA: Diagnosis not present

## 2016-01-21 DIAGNOSIS — I10 Essential (primary) hypertension: Secondary | ICD-10-CM | POA: Diagnosis not present

## 2016-01-21 DIAGNOSIS — E559 Vitamin D deficiency, unspecified: Secondary | ICD-10-CM | POA: Diagnosis not present

## 2016-01-21 DIAGNOSIS — R600 Localized edema: Secondary | ICD-10-CM

## 2016-01-21 DIAGNOSIS — R1013 Epigastric pain: Secondary | ICD-10-CM

## 2016-01-21 DIAGNOSIS — E6609 Other obesity due to excess calories: Secondary | ICD-10-CM | POA: Diagnosis not present

## 2016-01-21 LAB — COMPREHENSIVE METABOLIC PANEL
ALK PHOS: 89 U/L (ref 33–130)
ALT: 16 U/L (ref 6–29)
AST: 16 U/L (ref 10–35)
Albumin: 4.2 g/dL (ref 3.6–5.1)
BUN: 12 mg/dL (ref 7–25)
CALCIUM: 9.4 mg/dL (ref 8.6–10.4)
CO2: 24 mmol/L (ref 20–31)
Chloride: 108 mmol/L (ref 98–110)
Creat: 0.61 mg/dL (ref 0.50–1.05)
GLUCOSE: 97 mg/dL (ref 70–99)
POTASSIUM: 4.5 mmol/L (ref 3.5–5.3)
Sodium: 141 mmol/L (ref 135–146)
Total Bilirubin: 0.5 mg/dL (ref 0.2–1.2)
Total Protein: 6.6 g/dL (ref 6.1–8.1)

## 2016-01-21 LAB — LIPID PANEL
CHOL/HDL RATIO: 3.2 ratio (ref <5.0)
Cholesterol: 239 mg/dL — ABNORMAL HIGH (ref <200)
HDL: 75 mg/dL (ref >50)
LDL Cholesterol: 151 mg/dL — ABNORMAL HIGH (ref <100)
Triglycerides: 65 mg/dL (ref <150)
VLDL: 13 mg/dL (ref <30)

## 2016-01-21 LAB — GLUCOSE, POCT (MANUAL RESULT ENTRY): POC Glucose: 88 mg/dl (ref 70–99)

## 2016-01-21 LAB — T4, FREE: Free T4: 1.8 ng/dL (ref 0.8–1.8)

## 2016-01-21 LAB — TSH: TSH: 0.01 mIU/L — ABNORMAL LOW

## 2016-01-21 LAB — T3, FREE: T3 FREE: 3.8 pg/mL (ref 2.3–4.2)

## 2016-01-21 NOTE — Patient Instructions (Signed)
Follow up visit in 3 months. 

## 2016-01-21 NOTE — Progress Notes (Signed)
CC: Follow-up hypothyroid s/p I-131, hypertension, goiter, dyspepsia, fatigue, GERD, Hashimoto's disease, status post CVA, right hemiparesis, hyperlipidemia, smoking, and supraclavicular fullness    History of present illness: The patient is a 55 year old Caucasian woman.   1. The patient first presented to me on 08/10/04 in referral from her primary care provider, Dr. Frederica Kuster in [place], [place], for evaluation and management of hyperthyroidism. In retrospect, the patient had chronic problems with infected sinuses, inner ear symptoms, and spinning dizziness. She also had complaints of severe fatigue and weight gain. Dr. Jonette Eva had performed thyroid tests on 06/14/04. TSH was 0.13, free T4 1.04. Thyroid ultrasound showed small and large nodules in a thyroid gland that was somewhat enlarged overall. On examination she had a 20+ gram thyroid gland. Thyroid gland was firm, but nontender. She also had some diastolic hypertension. Thyroid tests showed a TSH of 0.437, free T4 1.01, and free T3 of 2.6. Her TPO antibody level was 29 9. Her TSI level was 98. It appeared at that time that the patient likely had some elevation of thyroid hormone due to Hashimoto's disease or had a combination of intermittently active Hashimoto's Disease and intermittently active  Graves' Disease.   2. During the next 2 years, her TSH values varied from 0.350-0.854. By February of 2008, however, her TSH decreased to 0.008, her free T4 increased to 2.08, and her free T3 increased to 5.8. She was clearly hyperthyroid and thyrotoxic. TSI level at that point was 1.8 with normal being 1.3 or less. On 05/25/06 she underwent radioactive iodine treatment with I-131. She subsequently became hypothyroid and was started on Synthroid replacement therapy. Since then her doses of Synthroid have been adjusted several times.   3. During the past 9 years the patient has had several other medical problems:  A. She has had one or more CVAs/TIAs, which  have resulted in some mild right hemiparesis and some transient memory losses. Her MRI of the brain in February 2010 showed evidence for remote infarcts involving the posterior right temporal lobe and parietal lobe.    B. She also had a 3-4 month episode of spontaneous galactorrhea in 2010. Brain MRI showed a 2 x 3 mm area of delayed enhancement seen posteriorly on the left side of her pituitary gland. I felt at the time that it would be very uncharacteristic for a true prolactinoma this small to actually produce hyperprolactinemia and clinical galactorrhea. The galactorrhea subsequently resolved spontaneously.   C. On 08/02/2010 she presented with increased swelling of both supraclavicular areas. A CT scan was performed which showed no significant pathology. She was evaluated by Dr. Christia Reading of [facility]. Dr. Jenne Pane could not identify any cause of the supraclavicular area swelling other than obesity. That swelling has since waxed and waned with her changes in weight.    D. The patient was started on Crestor for hyperlipidemia, but initially could not tolerate even the 5 mg dose more than twice per week. She was subsequently started on Zetia, 10 mg/day, which she tolerated better, but did not control her lipids as well. Crestor was later re-started at a lower dose and she was able to tolerate that medication. However, she later discontinued the Crestor due to symptoms.   E. She has had multiple GI complaints over time, to include severe, chronic diarrhea, heartburn, and abdominal pains. In the Summer of 2015 she saw a gastroenterologist who performed a colonoscopy and removed a polyp. He also put her on a gluten-free and casein-free diet. Her  symptoms improved on that diet, but she has not been consistent with that diet for quite some time.    3. The patient's last [facility] visit was on 07/29/15. At that visit I increased her Synthroid dose to 137 mcg/day. I asked her to resume the omeprazole, but she chose  not to do so. She does take Tums and Centrum Silver. She is still smoking.    A. In the interim she has been fairly healthy. She continues to have intermittent diarrhea that varies in frequency and severity over time. She is still followed by GI for this problem. She also has fairly consistent joint and soft tissue aches and pains. She still has one kidney stone, but has not had any further episodes of passing stone fragments. She has not had any further acute problems with TIAs or anterior hemi-circumferential neck swelling that previously gave her the sensation of difficulty breathing .   B.   She does not consistently follow her gluten-free and casein-free diet. She does not think that her diarrhea and her diet are related.   C. She continues to take Plavix, 75 mg/day, Centrum Silver daily, vitamin D, and calcium. She stopped taking fish oil due to indigestion. She has had problems with leaking water and mold in her home, so she has been eating out more.   3. Pertinent Review of Systems: Constitutional: She says, "I feel alright, besides being tired and having muscles and joints hurting." She snores a lot and sometimes wakes up choking or having trouble catching her breath. She has not had her sleep apnea evaluation because she does not want to wear a C-pap device. Her allergies continue to act up intermittently. She still has occasional problems swallowing when her allergies are worse or when she eats certain spices Eyes: Near vision is good with her new glasses. She had an eye exam 6 months ago. She was told that she had the beginning stages of macular degeneration. She also had the beginning of cataracts. She was supposed to have had a follow up exam earlier this week, but had to postpone that exam.  Neck: She was told in about 2010 or 2011 that her UGI showed a spastic esophagus. She has no other complaints of anterior neck swelling, soreness, tenderness, pressure, discomfort, or difficulty  swallowing. The swelling of the supraclavicular fossae has not been a problem recently.       Heart: She occasionally feels faster heart beats which she notes when lying on her left side. 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 Her cardiologist confirmed that diagnosis at her follow up appointment in September or October 2016. The patient has no other complaints of irregular heat beats, chest pain, or chest pressure. Gastrointestinal: She has normal stools some days, but watery or mushy stools on other days. Her GERD and heartburn are better when she avoids spicy foods.   Legs: Muscle mass and strength seem unchanged. Her right side remains somewhat weaker than her left. She still notes some burning of her calves at times. She also has some edema of the lower calves and ankles at times.  Feet: She sometimes has numbness, burning, and "tickling" in her feet. She notes some edema at times. Her right leg has become stronger over time, so she rarely drags her right foot anymore.  Neuro: She has persistent bilateral numbness and in her thumbs and first two fingers bilaterally.  Mental/psychological: "I'm not eating checkers  covered with catsup."  Her abilities to think, to pay attention, to remember, and to make decisions are good.   REVIEW OF SYSTEMS: There are no other significant problems involving her other body systems.  PAST MEDICAL, FAMILY, AND SOCIAL HISTORY:  1. Family: She still lives alone. She has more social interactions now with friends and family. She has two sisters with hypothyroidism. Her daughter is also having thyroid problems. 2. Activities: She has not been swimming since the Summer.   3. Tobacco: She is still smoking.   4. Primary care provider: She sees Donnita Falls, MD in [place].  5. Neurologist: Dr. Lacey Jensen, [facility], phone [phone]  PHYSICAL EXAM: BP: 123/76, HR 71, weight 180 pounds. Her  weight has decreased 4.5 pounds.  Constitutional: The patient looks healthy and alert today. Her sense of humor is great. Her insight and affect are normal. Her memory is much better than it was two years ago and appears to be clinically normal. She is quite mentally sharp today.  Eyes: There is no arcus or proptosis.  Mouth: The oropharynx appears normal. The tongue appears normal. There is normal oral moisture. There is no obvious gingivitis. Neck: There are no bruits present. The thyroid gland appears normal in size. The thyroid gland is again normal at approximately 18 grams in size. Both lobes are within normal size today. The consistency of the thyroid gland is normal. There is no thyroid tenderness to palpation. Her previous bilateral supraclavicular fullness is not present today.    Lungs: The lungs are clear. Air movement is good. Heart: The heart rhythm and rate appear normal. Heart sounds S1 and S2 are normal. I do not appreciate any pathologic heart murmurs. Abdomen: The abdomen is enlarged. Bowel sounds are normal. The abdomen is soft and non-tender. There is no obviously palpable hepatomegaly, splenomegaly, or other masses. She is not tender today.   Arms: Muscle mass appears appropriate for age.  Hands: She has 1+ tremor of her right hand today, but no tremor of her left hand. Phalangeal and metacarpophalangeal joints appear normal. Palms are normal. Legs: Muscle mass appears appropriate for age. There is no edema today.  Neurologic: Right hand grip is slightly weaker than the left handgrip. Right hip flexors are weaker than the left hip flexors. Sensation to touch is intact in her legs.     LABS:   Labs 01/21/16: HbA1c 5.8%  Labs 07/21/15: TSH 5.01, free T4 1.45, TPO antibody <1, anti-thyroglobulin antibody <1; CMP normal, with calcium 10.0; CBC normal  Labs 04/27/15: HbA1c 5.5%; TSH 3.61, free T4 1.4, free T3 2.2; cholesterol 258, triglycerides 96, HDL 77, LDL 162; PTH 32, calcium  09.8, 25-OH vitamin d 33  Labs 01/12/15: TSH 0.885, free T4 1.88, free T3 3.01; CBC: Normal except MCV 98; CMP normal; vitamin B12 (344 (normal 239-931), folic acid 19.7 (normal >5.4); PTH 80.49 (normal 12-72), calcium 9.10 (normal 8.5-10.6), 25-OH vitamin D 32.6; cholesterol 231, triglycerides 83, HDL 66, LDL 148  Labs 09/24/14: Ordered, but not performed  Labs 05/21/14: TSH 0.490, free T4 1.46, free T3 2.4; HbA1C 5.8%; calcium 9.3, PTH 52; 25-OH vitamin D 40; cholesterol 246, triglycerides 92, HDL 70, LDL 176; calcitonin <2  Labs 02/09/14: TSH 2.96, free T4 1.43, free T3 2.2  Labs 02/05/14: Urinalysis: large blood, 15 ketones; alkaline phosphatase 108 (normal 32-92);   Labs 02/02/14: Urinalysis: trace blood, no ketones; Hgb 13.6, Hct 43.9, MCV 100.3; CMP normal, with alkaline phosphatase 16 (normal 40-129); TSH 3.03; B12  305 (211-946); cholesterol 282, triglycerides 68, HDL 79, LDL 189; 25-hydroxy vitamin D >60  Labs 07/11/13: B12 236 (normal 211-911), calcitonin < 2 (normal < 6)  Labs 03/10/13: CMP normal. TSH 1.456, free T4 1.36, free T3 2.6; cholesterol 194, triglycerides 82, HDL 72, LDL 106  Labs 11/18/13: TSH 0.539, free T4 1.30, free T3 2.6  Labs 10/16/12: CMP normal; TSH 2.20, free T4 1.10, free T3 2.2; cholesterol 202, triglycerides 77, HDL 82, LDL 105  Labs 06/18/12: TSH 1.260, free T4 1.58, free T3 2.5, cholesterol 211, triglycerides 71, HDL 78, LDL 119  Labs 02/13/12: TSH 0.235, free T4 1.83, cholesterol 236, triglycerides 85, HDL 68, LDL 151. Had been back on Crestor for about 3 weeks, but was off it for about 7 days prior to the labs being drawn.   Labs 01/09/12: AST 45, ALT 70, cholesterol 298, triglycerides 85, HDL 72, LDL 209, TSH 0.44, free T4 1.52, 25-hydroxy,  vitamin D 41. Had been off Crestor for months.   Labs 09/06/11: TSH 0.663, free T4 1.65, T3 117, cholesterol 225, triglycerides 65, HDL 84, LDL 128  Labs 05/09/11: TSH 1.610. Free T4: 1.66. CMP is normal. Cholesterol  214, triglycerides 67, HDL was 73, LDL 128. 25-OH-vitamin D 41.7. She started Zetia as of  05/15/11.  ASSESSMENT: 1. Supraclavicular fullness: The swelling in the supraclavicular fossae has improved over time and is not at all evident today. It appears that the swelling was just fatty tissue.    2. Obesity: Her weight has decreased by 4.5 pounds, but is still too high. Her GI problems and her arthritis make it difficult for her to exercise. Swimming helps when she does it.      3. Hypertension: BP is good today. She still needs daily exercise.  3. Hypothyroid, s/p I-131:   A. She was euthyroid in January 2015.  She was borderline hyperthyroid in March 2016. Her TFTs in November 2016 were essentially normal, although her free T4 was a bit elevated. Her TFTs in May 2017 were low, so I increased her Synthroid dose to 137 mcg/day. She reportedly had TFTs done in August, but we do not have those results.   B. I have wondered if her intermittent intestinal problems are causing intermittent malabsorption of Synthroid. I also wondered if taking her fish oil at bedtime might be interfering. Now that she is no longer taking fish oil it is a good opportunity to check her TFTs.  4. Hypercholesterolemia: The patient's most recent total cholesterol and LDL in February 2017 were higher than in November 2016. She stopped taking fish oil due to indigestion. Her increases in lipids could have been due in part to those times that she was hypothyroid. 5. Goiter: Thyroid gland is again within normal limits for size today.   6. Thyroiditis: Hashimoto's disease is clinically quiescent today. The non-physiologic shifts in her TFTs at times are c/w intermittent Hashimoto's Dz activity. 7. Dyspepsia/GERD: Her dyspepsia and GERD vary depending upon her diet, her compliance/noncompliance with taking omeprazole, and her tobacco intake.    8. Tobacco: At this point in time she is not motivated to stop smoking. I again begged her to  stop smoking. She again politely told me that it is too hard to stop. I reminded her that she has been able to recover fairly well from her previous stroke(s). She may not be so lucky the next time. If she wants to preserve her brain tissue and her abilities to be active and to live  Independently, she needs to stop smoking.  9. Edema: She does not have any edema today. She sometimes takes in more salt than she needs.  10. 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 When she was on the diet her diarrhea stopped. Her calcitonin was negative, ruling out hypercalcitoninemia as a cause of her diarrhea. She feels that her diarrhea and soft stools are not severe enough for her to resume her gluten-free and casein-free diet. I asked her to follow the diet very strictly for one month and record the number of diarrheal episodes that she had. Then go off the diet for one month and record as well. That trial will allow us to see whether the diet makes a major difference in her diarrhea.  11. Excess vitamin D: Her vitamin D levels in March and November 2016 and again in February 2017 were normal.  12. S/p CVA:  Her MRI on 04/16/08 showed CNS defects in the right cerebrum. Her neurologic defects, however, were on the right side of her body. She may have had more CNS damage in her left cerebrum than had been recognized.  She seems to have recovered fairly well, but still has some right hemiparetic findings. 13. Hyperparathyroidism, secondary to inadequate calcium intake: At last visit I recommended that she take Synthroid at breakfast, two Tums at lunch, her Centrum Silver at dinner, and two Tums at bedtime. Her calcium in May 2017 was at about the 75-80% of the normal range.   PLAN: 1. Diagnostic: Repeat TFT, calcium, PTH, 25-OH vitamin D, lipid panel, CMP. 2. Therapeutic: Continue Synthroid dose of 137 mcg/day. Continue the Centrum silver.  Stop smoking. Take Tums as above. 3. Patient education: We  discussed her GI problems, her thyroiditis and hypothyroidism, and her neuro issues. I begged her again to stop smoking.  4. Follow-up: Follow up visit in 3 months.  Level of Service: This visit lasted in excess of 50 minutes. More than 50% of the visit was devoted to counseling.  David StallBRENNAN,Taylon Coole J, MD, CDE Adult and Pediatric Endocrinology

## 2016-01-22 LAB — VITAMIN D 25 HYDROXY (VIT D DEFICIENCY, FRACTURES): Vit D, 25-Hydroxy: 35 ng/mL (ref 30–100)

## 2016-01-24 LAB — PTH, INTACT AND CALCIUM
CALCIUM: 9.4 mg/dL (ref 8.6–10.4)
PTH: 52 pg/mL (ref 14–64)

## 2016-02-22 ENCOUNTER — Telehealth (INDEPENDENT_AMBULATORY_CARE_PROVIDER_SITE_OTHER): Payer: Self-pay | Admitting: "Endocrinology

## 2016-02-22 NOTE — Telephone Encounter (Signed)
1. I called her with her lab results: Her PTH, calcium, vitamin D, and CMP were normal. Her cholesterol and LDL were elevated. She says that she is not surprised because she has been eating out a lot. Her TFTs are high since stopping the fish oil. She does not want to resume taking fish oil. Since the fish oil was reducing her Synthroid absorption, once she stopped the fish oil she became hyperthyroid. Reduce the 137 Synthroid to one tablet per day on 6 days of each week = 118 mcg/day for 7 days each week.  2. We need to repeat her TFTs in  2 months.  Molli KnockMichael Rorie Delmore, MD, CDE Adult and Pediatric Endocrinology

## 2016-04-10 ENCOUNTER — Other Ambulatory Visit: Payer: Self-pay | Admitting: "Endocrinology

## 2016-04-10 DIAGNOSIS — E063 Autoimmune thyroiditis: Secondary | ICD-10-CM

## 2016-04-25 ENCOUNTER — Encounter (INDEPENDENT_AMBULATORY_CARE_PROVIDER_SITE_OTHER): Payer: Self-pay | Admitting: "Endocrinology

## 2016-04-25 ENCOUNTER — Ambulatory Visit (INDEPENDENT_AMBULATORY_CARE_PROVIDER_SITE_OTHER): Payer: Medicare Other | Admitting: "Endocrinology

## 2016-04-25 ENCOUNTER — Encounter (INDEPENDENT_AMBULATORY_CARE_PROVIDER_SITE_OTHER): Payer: Self-pay | Admitting: *Deleted

## 2016-04-25 VITALS — BP 122/84 | HR 76 | Ht 62.21 in | Wt 181.6 lb

## 2016-04-25 DIAGNOSIS — R7309 Other abnormal glucose: Secondary | ICD-10-CM

## 2016-04-25 DIAGNOSIS — E211 Secondary hyperparathyroidism, not elsewhere classified: Secondary | ICD-10-CM

## 2016-04-25 DIAGNOSIS — E890 Postprocedural hypothyroidism: Secondary | ICD-10-CM

## 2016-04-25 DIAGNOSIS — F17200 Nicotine dependence, unspecified, uncomplicated: Secondary | ICD-10-CM | POA: Diagnosis not present

## 2016-04-25 DIAGNOSIS — E6609 Other obesity due to excess calories: Secondary | ICD-10-CM | POA: Diagnosis not present

## 2016-04-25 DIAGNOSIS — I69359 Hemiplegia and hemiparesis following cerebral infarction affecting unspecified side: Secondary | ICD-10-CM

## 2016-04-25 DIAGNOSIS — E7800 Pure hypercholesterolemia, unspecified: Secondary | ICD-10-CM

## 2016-04-25 DIAGNOSIS — R7303 Prediabetes: Secondary | ICD-10-CM | POA: Diagnosis not present

## 2016-04-25 DIAGNOSIS — I10 Essential (primary) hypertension: Secondary | ICD-10-CM | POA: Diagnosis not present

## 2016-04-25 LAB — POCT GLYCOSYLATED HEMOGLOBIN (HGB A1C)
Hemoglobin A1C: 105
Hemoglobin A1C: 5.3

## 2016-04-25 LAB — GLUCOSE, POCT (MANUAL RESULT ENTRY): POC Glucose: 86 mg/dl (ref 70–99)

## 2016-04-25 LAB — TSH: TSH: 0.13 m[IU]/L — AB

## 2016-04-25 LAB — T4, FREE: FREE T4: 1.8 ng/dL (ref 0.8–1.8)

## 2016-04-25 LAB — T3, FREE: T3, Free: 3.1 pg/mL (ref 2.3–4.2)

## 2016-04-25 NOTE — Progress Notes (Signed)
CC: Follow-up hypothyroid s/p I-131, hypertension, goiter, dyspepsia, fatigue, GERD, Hashimoto's disease, status post CVA, right hemiparesis, hyperlipidemia, smoking, and supraclavicular fullness    History of present illness: The patient is a 56 year old Caucasian woman.   1. The patient first presented to me on 08/10/04 in referral from her primary care provider, Dr. Frederica Kuster in [place], [place], for evaluation and management of hyperthyroidism. In retrospect, the patient had chronic problems with infected sinuses, inner ear symptoms, and spinning dizziness. She also had complaints of severe fatigue and weight gain. Dr. Jonette Eva had performed thyroid tests on 06/14/04. TSH was 0.13, free T4 1.04. Thyroid ultrasound showed small and large nodules in a thyroid gland that was somewhat enlarged overall. On examination she had a 20+ gram thyroid gland. Thyroid gland was firm, but nontender. She also had some diastolic hypertension. Thyroid tests showed a TSH of 0.437, free T4 1.01, and free T3 of 2.6. Her TPO antibody level was 29 9. Her TSI level was 98. It appeared at that time that the patient likely had some elevation of thyroid hormone due to Hashimoto's disease or had a combination of intermittently active Hashimoto's Disease and intermittently active  Graves' Disease.   2. During the next 2 years, her TSH values varied from 0.350-0.854. By February of 2008, however, her TSH decreased to 0.008, her free T4 increased to 2.08, and her free T3 increased to 5.8. She was clearly hyperthyroid and thyrotoxic. TSI level at that point was 1.8 with normal being 1.3 or less. On 05/25/06 she underwent radioactive iodine treatment with I-131. She subsequently became hypothyroid and was started on Synthroid replacement therapy. Since then her doses of Synthroid have been adjusted several times.   3. During the past 10 years the patient has had several other medical problems:  A. She has had one or more CVAs/TIAs, which  have resulted in some mild right hemiparesis and some transient memory losses. Her MRI of the brain in February 2010 showed evidence for remote infarcts involving the posterior right temporal lobe and parietal lobe.    B. She also had a 3-4 month episode of spontaneous galactorrhea in 2010. Brain MRI showed a 2 x 3 mm area of delayed enhancement seen posteriorly on the left side of her pituitary gland. I felt at the time that it would be very uncharacteristic for a true prolactinoma this small to actually produce hyperprolactinemia and clinical galactorrhea. The galactorrhea subsequently resolved spontaneously.   C. On 08/02/2010 she presented with increased swelling of both supraclavicular areas. A CT scan was performed which showed no significant pathology. She was evaluated by Dr. Christia Reading of [facility]. Dr. Jenne Pane could not identify any cause of the supraclavicular area swelling other than obesity. That swelling has since waxed and waned with her changes in weight.    D. The patient was started on Crestor for hyperlipidemia, but initially could not tolerate even the 5 mg dose more than twice per week. She was subsequently started on Zetia, 10 mg/day, which she tolerated better, but did not control her lipids as well. Crestor was later re-started at a lower dose and she was able to tolerate that medication. However, she later discontinued the Crestor due to symptoms.   E. She has had multiple GI complaints over time, to include severe, chronic diarrhea, heartburn, and abdominal pains. In the Summer of 2015 she saw a gastroenterologist who performed a colonoscopy and removed a polyp. He also put her on a gluten-free and casein-free diet. Her  symptoms improved on that diet, but she has not been consistent with that diet for quite some time.    3. The patient's last [facility] visit was on 01/21/16. After that visit I decreased her Synthroid dose to 137 mcg/day for 6 days each week, with no pill on the 7th  days. She is not taking omeprazole. She does take Tums, Centrum Silver, and calcium once daily. She is still smoking.    A. In the interim she has been fairly healthy. She continues to have intermittent diarrhea that varies in frequency and severity over time. She is still followed by GI for this problem. She also has fairly consistent joint and soft tissue aches and pains. She still has one kidney stone, but has not had any further episodes of passing stone fragments. On New Year's Eve she was very confused and felt weak for several hours. She did not have a headache. Her right arm felt somewhat weak. Later that evening and the next day she felt fine. She is due to se her neurologist next week.    B.   She does not consistently follow her gluten-free and casein-free diet. She does not think that her diarrhea and her diet are related.   C. She continues to take Plavix, 75 mg/day. She stopped taking fish oil due to indigestion. She has still been eating out a lot due to having home repairs done almost continuously.    3. Pertinent Review of Systems: Constitutional: She says, "I'm tired and worn out. All my joints hurt and ache from this cold, rainy weather." She still snores a lot and sometimes wakes up choking or having trouble catching her breath. She has not had her sleep apnea evaluation because she does not want to wear a C-pap device. Her allergies continue to act up intermittently. She still has occasional problems swallowing when her allergies are worse or when she eats certain spices Eyes: Near vision is good with her new glasses. She had an eye exam 3 months ago. She was told that she had the beginning stages of macular degeneration. She also had the beginning of cataracts. Her eyes showed signs of high blood pressure. She has a follow up exam next month.  Neck: She was told in about 2010 or 2011 that her UGI showed a spastic esophagus. She has no complaints of anterior neck swelling, soreness,  tenderness, pressure, discomfort, or difficulty swallowing. She continues to have episodic trapezius spasm and pinching of the cervical nerves.       Heart: She occasionally feels faster heart beats which she notes when she is unusually active or when she is lying on her left side. 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 Her cardiologist confirmed that diagnosis at her follow up appointment in September or October 2016. The patient has no other complaints of irregular heat beats, chest pain, or chest pressure. Gastrointestinal: She has normal stools some days, but watery or mushy stools on most days. Her GERD and heartburn are better when she avoids spicy foods.   Legs: She occasionally has the sensation of something crawling on her skin in the upper right thigh. Muscle mass and strength seem unchanged. Her right side remains somewhat weaker than her left. She still notes some burning of her calves at times. She occasionally has some edema of the lower calves and ankles.  Feet: She sometimes has numbness, burning, and "tickling" in her feet, more often  on the right foot. She notes some edema at times. Her right leg has become stronger over time, so she rarely drags her right foot anymore.  Neuro: She has persistent bilateral numbness and in her thumbs and first two fingers bilaterally.  Mental/psychological: "I'm not eating checkers."  Her abilities to think, to pay attention, to remember, and to make decisions are good.   REVIEW OF SYSTEMS: There are no other significant problems involving her other body systems.  PAST MEDICAL, FAMILY, AND SOCIAL HISTORY:  1. Family: She still lives alone. She has more social interactions now with friends and family. She has two sisters with hypothyroidism. Her daughter is also having thyroid problems. Her home renovations will probably continue through next week, or perhaps longer.  2. Activities: She has not been  physically active.   3. Tobacco: She is still smoking.   4. Primary care provider: Dr. Donnita Falls, MD in [place].  5. Neurologist: Dr. Lacey Jensen, [name], phone [phone]  PHYSICAL EXAM: BP: 122/84, HR 76, weight 181 pounds and 9.6 ounces. Her weight has increased 1.5 pounds.  Constitutional: The patient looks healthy and alert today. Her sense of humor is good. Her insight and affect are normal. Her memory is much better than it was two years ago and appears to be clinically normal. She is quite mentally sharp today.  Eyes: There is no arcus or proptosis.  Mouth: The oropharynx appears normal. The tongue appears normal, but is tobacco stained. There is normal oral moisture. There is no obvious gingivitis. Neck: There are no bruits present. The thyroid gland appears normal in size. The thyroid gland is again normal at approximately 18-20 grams in size. Both lobes are within normal size today. The consistency of the thyroid gland is normal. There is no thyroid tenderness to palpation. Her previous bilateral supraclavicular fullness is not present today. Her trapezius muscles are tight and tender.  Lungs: The lungs are clear. Air movement is good. Heart: The heart rhythm and rate appear normal. Heart sounds S1 and S2 are normal. I do not appreciate any pathologic heart murmurs. Abdomen: The abdomen is enlarged. Bowel sounds are normal. The abdomen is soft and non-tender. There is no obviously palpable hepatomegaly, splenomegaly, or other masses. She is not tender today.   Arms: Muscle mass appears appropriate for age.  Hands: She has 1+ tremor of her right hand today, but only trace tremor of her left hand. Phalangeal and metacarpophalangeal joints appear normal. Palms are normal. Legs: Muscle mass appears appropriate for age. There is no edema today.  Neurologic: Right hand grip is slightly weaker than the left handgrip. Right hip flexors are weaker than the left hip  flexors. Sensation to touch is intact in her legs.     LABS:   Labs 04/25/16: HbA1c 5.3%, CBG 105  Labs 01/21/16: HbA1c 5.8%; calcium 9.4, PTH 52, 25-OH vitamin D 35; TSH 0.01, free T4 1.8, free T3 3.8; cholesterol 239, triglycerides 65, HDL 75, LDL 151; CMP normal  Labs 11/02/54: TSH 5.01, free T4 1.45, TPO antibody <1, anti-thyroglobulin antibody <1; CMP normal, with calcium 10.0; CBC normal  Labs 04/27/15: HbA1c 5.5%; TSH 3.61, free T4 1.4, free T3 2.2; cholesterol 258, triglycerides 96, HDL 77, LDL 162; PTH 32, calcium 10.0, 25-OH vitamin d 33  Labs 01/12/15: TSH 0.885, free T4 1.88, free T3 3.01; CBC: Normal except MCV 98; CMP normal; vitamin B12 (344 (normal 239-931), folic acid 19.7 (normal >5.4); PTH 80.49 (normal 12-72), calcium 9.10 (normal  8.5-10.6), 25-OH vitamin D 32.6; cholesterol 231, triglycerides 83, HDL 66, LDL 148  Labs 09/24/14: Ordered, but not performed  Labs 05/21/14: TSH 0.490, free T4 1.46, free T3 2.4; HbA1C 5.8%; calcium 9.3, PTH 52; 25-OH vitamin D 40; cholesterol 246, triglycerides 92, HDL 70, LDL 176; calcitonin <2  Labs 02/09/14: TSH 2.96, free T4 1.43, free T3 2.2  Labs 02/05/14: Urinalysis: large blood, 15 ketones; alkaline phosphatase 108 (normal 32-92);   Labs 02/02/14: Urinalysis: trace blood, no ketones; Hgb 13.6, Hct 43.9, MCV 100.3; CMP normal, with alkaline phosphatase 16 (normal 40-129); TSH 3.03; B12 305 (211-946); cholesterol 282, triglycerides 68, HDL 79, LDL 189; 25-hydroxy vitamin D >60  Labs 07/11/13: B12 236 (normal 211-911), calcitonin < 2 (normal < 6)  Labs 03/10/13: CMP normal. TSH 1.456, free T4 1.36, free T3 2.6; cholesterol 194, triglycerides 82, HDL 72, LDL 106  Labs 11/18/13: TSH 0.539, free T4 1.30, free T3 2.6  Labs 10/16/12: CMP normal; TSH 2.20, free T4 1.10, free T3 2.2; cholesterol 202, triglycerides 77, HDL 82, LDL 105  Labs 06/18/12: TSH 1.260, free T4 1.58, free T3 2.5, cholesterol 211, triglycerides 71, HDL 78, LDL 119  Labs  02/13/12: TSH 0.235, free T4 1.83, cholesterol 236, triglycerides 85, HDL 68, LDL 151. Had been back on Crestor for about 3 weeks, but was off it for about 7 days prior to the labs being drawn.   Labs 01/09/12: AST 45, ALT 70, cholesterol 298, triglycerides 85, HDL 72, LDL 209, TSH 0.44, free T4 1.52, 25-hydroxy,  vitamin D 41. Had been off Crestor for months.   Labs 09/06/11: TSH 0.663, free T4 1.65, T3 117, cholesterol 225, triglycerides 65, HDL 84, LDL 128  Labs 05/09/11: TSH 1.6101.700. Free T4: 1.66. CMP is normal. Cholesterol 214, triglycerides 67, HDL was 73, LDL 128. 25-OH-vitamin D 41.7. She started Zetia as of  05/15/11.  ASSESSMENT: 1. Supraclavicular fullness: The swelling in the supraclavicular fossae has improved over time and is not evident today. It appears that the swelling was just fatty tissue.    2. Obesity: Her weight has increased by 1.5 pounds. Her GI problems and her arthritis make it difficult for her to exercise. Swimming helps when she does it.      3. Hypertension: BP is pretty good today. She still needs daily exercise.  3. Hypothyroid, s/p I-131:   A. She was euthyroid in January 2015.  She was borderline hyperthyroid in March 2016. Her TFTs in November 2016 were essentially normal, although her free T4 was a bit elevated. Her TFTs in May 2017 were low, so I increased her Synthroid dose to 137 mcg/day.   B. At her last visit she had discontinued the fish oil and was hyperthyroid, presumably because the fish oil had caused her absorption of Synthroid to be decreased. I reduced her synthroid dosage appropriately.   4. Hypercholesterolemia: The patient's most recent total cholesterol and LDL in November 2017 were lower than in February 2017, but still too high. She stopped taking fish oil due to indigestion.  5. Goiter: Thyroid gland is again within normal limits for size today.   6. Thyroiditis: Hashimoto's disease is clinically quiescent today. The non-physiologic shifts in her  TFTs at times are c/w intermittent Hashimoto's Dz activity. 7. Dyspepsia/GERD: Her dyspepsia and GERD vary depending upon her diet, her compliance/noncompliance with taking omeprazole, and her tobacco intake.    8. Tobacco: At this point in time she is not very motivated to stop smoking. I once again  begged her to stop smoking. She again politely told me that she will try to stop, but that it is very hard to do so.  I again reminded her that she has been able to recover fairly well from her previous stroke(s). She may not be so lucky the next time. If she wants to preserve her brain tissue and her abilities to be active and to live  Independently, she needs to stop smoking.  9. Edema: She does not have any edema today. She sometimes takes in more salt than she needs.  10. 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 When she was on the diet her diarrhea stopped. Her calcitonin was negative, ruling out hypercalcitoninemia as a cause of her diarrhea. She feels that her diarrhea and soft stools are not severe enough for her to resume her gluten-free and casein-free diet. She will see her GI doctor soon.  11. Excess vitamin D: Her vitamin D levels in 2016 and again in 2017 were normal.  12. S/p CVA:  Her MRI on 04/16/08 showed CNS defects in the right cerebrum. Her neurologic defects, however, were on the right side of her body. She may have had more CNS damage in her left cerebrum than had been recognized.  She seems to have recovered fairly well, but still has some right hemiparetic findings. I suspect that her confusion on New Year's Eve may have been caused by another TIA. 13. Hyperparathyroidism, secondary to inadequate calcium intake: Her PTH and calcium values in November 2017 were good. She needs to continue current doses of calcium, MI, and vitamin D.  PLAN: 1. Diagnostic: Repeat TFTs. 2. Therapeutic: Continue Synthroid dose of 137 mcg/day for 6 days each week for now.  Continue the  calcium and  Centrum silver.  Stop smoking. Take Tums as above. 3. Patient education: We discussed her GI problems, her thyroiditis and hypothyroidism, and her neuro issues. I begged her again to stop smoking.  4. Follow-up: Follow up visit in 3 months.  Level of Service: This visit lasted in excess of 50 minutes. More than 50% of the visit was devoted to counseling.  Molli Knock, MD, CDE Adult and Pediatric Endocrinology

## 2016-04-25 NOTE — Patient Instructions (Signed)
Follow up visit in 3 months. 

## 2016-05-02 ENCOUNTER — Telehealth (INDEPENDENT_AMBULATORY_CARE_PROVIDER_SITE_OTHER): Payer: Self-pay | Admitting: *Deleted

## 2016-05-02 NOTE — Telephone Encounter (Signed)
Spoke to patient, advised that per Dr. Fransico MichaelBrennan TFTs are still too high. Skip the Synthroid 137 mcg pill on two days each week. Repeat TFTS in 2 months.

## 2016-06-14 ENCOUNTER — Ambulatory Visit (INDEPENDENT_AMBULATORY_CARE_PROVIDER_SITE_OTHER): Payer: Medicare Other

## 2016-06-14 ENCOUNTER — Ambulatory Visit (INDEPENDENT_AMBULATORY_CARE_PROVIDER_SITE_OTHER): Payer: Medicare Other | Admitting: Orthopaedic Surgery

## 2016-06-14 ENCOUNTER — Encounter (INDEPENDENT_AMBULATORY_CARE_PROVIDER_SITE_OTHER): Payer: Self-pay | Admitting: Orthopaedic Surgery

## 2016-06-14 VITALS — BP 135/83 | HR 66 | Ht 61.0 in | Wt 182.0 lb

## 2016-06-14 DIAGNOSIS — M4726 Other spondylosis with radiculopathy, lumbar region: Secondary | ICD-10-CM

## 2016-06-14 DIAGNOSIS — M542 Cervicalgia: Secondary | ICD-10-CM | POA: Diagnosis not present

## 2016-06-14 DIAGNOSIS — M25561 Pain in right knee: Secondary | ICD-10-CM | POA: Diagnosis not present

## 2016-06-14 DIAGNOSIS — M5441 Lumbago with sciatica, right side: Secondary | ICD-10-CM | POA: Diagnosis not present

## 2016-06-14 DIAGNOSIS — G8929 Other chronic pain: Secondary | ICD-10-CM

## 2016-06-14 MED ORDER — METHYLPREDNISOLONE ACETATE 40 MG/ML IJ SUSP
80.0000 mg | INTRAMUSCULAR | Status: AC | PRN
  Administered 2016-06-14: 80 mg

## 2016-06-14 MED ORDER — BUPIVACAINE HCL 0.25 % IJ SOLN
6.0000 mL | INTRAMUSCULAR | Status: AC | PRN
  Administered 2016-06-14: 6 mL via INTRA_ARTICULAR

## 2016-06-14 MED ORDER — LIDOCAINE HCL 1 % IJ SOLN
3.0000 mL | INTRAMUSCULAR | Status: AC | PRN
  Administered 2016-06-14: 3 mL

## 2016-06-14 NOTE — Progress Notes (Signed)
Office Visit Note   Patient: [name] [name]           Date of Birth: [birth year]           MRN: [id number] Visit Date: 06/14/2016              Requested by: Frederica Kuster, MD [address] [place], [place] [address] PCP: [name], MD   Assessment & Plan: Visit Diagnoses:  1. Neck pain   2. Chronic bilateral low back pain with right-sided sciatica   3. Acute pain of right knee   4. Other spondylosis with radiculopathy, lumbar region     Plan: For chronic low back pain after fusion 2009 I  will order CT lumbar spine without contrast. Advised patient that her neck pain should gradually improve. If this worsens we may consider further imaging there. I do not think this is indicated at this time. The try to give some relief of her knee pain offered injection. After patient consent he was prepped with Betadine and after using Xylocaine for local anesthetic about 20 mL of serous fluid was aspirated and then intra-articular Marcaine/Depo-Medrol 62 injection was done. Tolerated without complication. Patient reported excellent relief after a few minutes with Marcaine and place. Patient was given a prescription for a off-the-shelf [employer]. Recommend that she get this due to her weakness that she is describing and hopefully will help with her drop symptoms. Follow-up in 3 weeks for recheck to review CT scan.  Follow-Up Instructions: Return in about 3 weeks (around 07/05/2016) for Review of CT lumbar spine with Dr. Ophelia Charter and recheck for right knee.   Orders:  Orders Placed This Encounter  Procedures  . XR Cervical Spine 2 or 3 views  . XR Lumbar Spine 2-3 Views  . XR KNEE 3 VIEW RIGHT  . CT LUMBAR SPINE WO CONTRAST   No orders of the defined types were placed in this encounter.     Procedures: Large Joint Inj Date/Time: 06/14/2016 11:11 AM Performed by: Naida Sleight Authorized by: Naida Sleight   Consent Given by:  Patient Timeout: prior to procedure the correct patient,  procedure, and site was verified   Location:  Knee Site:  R knee Needle Size:  22 G Needle Length:  1.5 inches Approach:  Superolateral Ultrasound Guidance: No   Fluoroscopic Guidance: No   Arthrogram: No   Medications:  3 mL lidocaine 1 %; 80 mg methylPREDNISolone acetate 40 MG/ML; 6 mL bupivacaine 0.25 % Aspiration Attempted: Yes   Aspirate amount (mL):  20 Aspirate:  Serous     Clinical Data: No additional findings.   Subjective: Chief Complaint  Patient presents with  . Neck - Pain  . Lower Back - Pain  . Right Knee - Pain    HPI Patient comes in today with complaints of neck pain, low back pain, right leg weakness and right knee pain. Patient status post L4-S1 fusion Dr. Ophelia Charter in 2009. Last seen in the office 2015. States that she is continue to have chronic low back pain since her last visit. Getting somewhat worse. Current issues today aggravated by fall about 3 weeks ago when she was walking in her bedroom and she cut her foot on some foreign she fell forward onto her hands and knees. [place] after. No cervical radicular symptoms. 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 Symptoms have any lightheadedness dizziness or loss of consciousness when she fell. She also has a history of  patent foramen ovale repair. She is not frequently use a cane or a walker for chronic balance issues from the hemiparesis. Neck pain 1 posterior aspect. Low back pain central lumbar with radiation into the right greater than left hip. Right knee pain with ambulation. Has has some swelling. Review of Systems  Constitutional: Positive for activity change.  HENT: Negative.   Respiratory: Negative.   Cardiovascular: Negative.   Gastrointestinal: Negative.   Genitourinary: Negative.   Musculoskeletal: Positive for back pain, joint swelling, myalgias, neck pain and neck stiffness.  Skin: Negative.   Neurological: Positive for weakness (Right-sided hemiparesis) and numbness.  Negative for dizziness and light-headedness.  Psychiatric/Behavioral: Negative.      Objective: Vital Signs: BP 135/83   Pulse 66   Ht  (1.549 m)   Wt 182 lb (82.6 kg)   BMI 34.39 kg/m   Physical Exam  Constitutional: She appears well-nourished. No distress.  HENT:  Head: Normocephalic and atraumatic.  Eyes: EOM are normal.  Neck: Normal range of motion.  She does have some tenderness posterior neck and slightly into the trapezius muscles. Negative Spurling test. No brachial plexus tenderness.  Pulmonary/Chest: No respiratory distress.  Abdominal: She exhibits no distension.  Musculoskeletal:  Gait is antalgic. She has mild lumbar paraspinal tenderness. Bilateral shoulders good range of motion. Negative impingement test. Bilateral elbows good range of motion. Negative logroll bilateral hips. Negative straight leg raise. She has one finger giveaway weakness with resistance right bicep, tricep, hip flexor, quadriceps, anterior tib and gastroc. Strong on the left. Left knee good range motion. Small effusion. Joint line tender. No signs of infection. Bilateral calves nontender.  Neurological: She is alert.  Skin: Skin is warm and dry.  Psychiatric: She has a normal mood and affect.    Ortho Exam  Specialty Comments:  No specialty comments available.  Imaging: Xr Cervical Spine 2 Or 3 Views  Result Date: 06/14/2016 Cervical spine x-rays show straightening of the cervical lordosis. No acute findings.  multilevel degenerative disc disease. No disc space collapse.  Xr Knee 3 View Right  Result Date: 06/14/2016 X-ray right knee shows some tricompartmental narrowing. No acute findings. No signs of fracture.  Xr Lumbar Spine 2-3 Views  Result Date: 06/14/2016 Lumbar spine x-rays show that her hardware is intact at L4-S1 from previous fusion. Dr. Ophelia Charter reviewed x-rays and appears that slight backing of the L4-5 cage.  Fusion appears solid. Adjacent segment disease L3-4 with a  small spur into the foramen.    PMFS History: Patient Active Problem List   Diagnosis Date Noted  . Dyspepsia 07/13/2013  . Smoking 07/13/2013  . Diarrhea 07/13/2013  . 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 Goiter 06/25/2012  . Hypercholesterolemia 02/25/2012  . Diverticulitis   . Galactorrhea syndrome   . Urinary calculus   . Thyroid disease   . Vertigo   . Hypertension   . GERD (gastroesophageal reflux disease)   . Stroke (HCC)   . Patent foramen ovale   . Migraine headache   . Visual changes   . Allergy to Coumadin   . Thyroiditis, autoimmune   . Hemiparesis, right (HCC)   . Memory problem   . Ejection fraction    Past Medical History:  Diagnosis Date  . Allergy to Coumadin    Not an allergy but pt declines Coumadin  . Diverticulitis    August, 2013  . Ejection fraction    EF normal by bedside echo per Dr. Andee Lineman, office visit, July, 2012  .  Fatigue   . Galactorrhea syndrome   . GERD (gastroesophageal reflux disease)   . Hemiparesis, right (HCC)   . Hypertension   . Hypothyroidism, acquired, autoimmune   . Memory problem   . Migraine headache    Versus TIAs  . Patent foramen ovale    Status post device closure  at [facility]  25 mmGore Helix occluder device  . Stroke [facility])    Silent right brain stroke/cryptogenic stroke  . Thyroid disease    Hypothyroidism, post-radiation  . Thyroiditis, autoimmune   . Tobacco abuse   . Urinary calculus   . Vertigo   . Visual changes     Family History  Problem Relation Age of Onset  . Stroke Other   . Hypertension Other   . Diabetes Mother   . Diabetes Father   . Thyroid disease Maternal Aunt   . Diabetes Maternal Grandmother   . Diabetes Paternal Grandmother   . Cancer Paternal Grandmother     Past Surgical History:  Procedure Laterality Date  . I-131 TREATMENT  05/25/2006  . TONSILLECTOMY AND ADENOIDECTOMY    . TOTAL ABDOMINAL HYSTERECTOMY  1997   No bso   Social History    Occupational History  . Conservation officer, [employer]   Social History Main Topics  . Smoking status: Current Every Day Smoker    Packs/day: 1.00    Years: 35.00    Types: Cigarettes  . Smokeless tobacco: Never Used  . Alcohol use 0.0 oz/week     Comment: Rarely  . Drug use: No  . Sexual activity: Not on file

## 2016-06-15 ENCOUNTER — Telehealth (INDEPENDENT_AMBULATORY_CARE_PROVIDER_SITE_OTHER): Payer: Self-pay | Admitting: *Deleted

## 2016-06-15 NOTE — Telephone Encounter (Signed)
I called pt to give her appt for CT scan of Neck and while on phone she mentioned the xray of her neck and stated when the dr. Was reviewing it with her she saw a GRAY dot in the middle of no where on the front part of xray, she wants to know if it was a nodule and if it is she wants to call her pcp to see if he can adjust her medications. Pt would like for dr to look at xray again and let her know.   C/B [phone]

## 2016-06-15 NOTE — Telephone Encounter (Signed)
Please advise 

## 2016-06-15 NOTE — Telephone Encounter (Signed)
I CALLED DISCUSSED.  

## 2016-06-22 ENCOUNTER — Ambulatory Visit (HOSPITAL_COMMUNITY)
Admission: RE | Admit: 2016-06-22 | Discharge: 2016-06-22 | Disposition: A | Discharge status: Home / Self Care | Payer: Medicare Other | Source: Ambulatory Visit | Attending: Surgery | Admitting: Surgery

## 2016-06-22 DIAGNOSIS — M4726 Other spondylosis with radiculopathy, lumbar region: Secondary | ICD-10-CM | POA: Diagnosis present

## 2016-06-22 DIAGNOSIS — M5116 Intervertebral disc disorders with radiculopathy, lumbar region: Secondary | ICD-10-CM | POA: Diagnosis not present

## 2016-06-22 DIAGNOSIS — N200 Calculus of kidney: Secondary | ICD-10-CM | POA: Insufficient documentation

## 2016-06-22 DIAGNOSIS — Z981 Arthrodesis status: Secondary | ICD-10-CM | POA: Insufficient documentation

## 2016-07-05 DIAGNOSIS — K529 Noninfective gastroenteritis and colitis, unspecified: Secondary | ICD-10-CM | POA: Insufficient documentation

## 2016-07-07 ENCOUNTER — Ambulatory Visit (INDEPENDENT_AMBULATORY_CARE_PROVIDER_SITE_OTHER): Payer: Medicare Other | Admitting: Orthopaedic Surgery

## 2016-07-07 ENCOUNTER — Encounter (INDEPENDENT_AMBULATORY_CARE_PROVIDER_SITE_OTHER): Payer: Self-pay | Admitting: Orthopaedic Surgery

## 2016-07-07 VITALS — BP 132/75 | HR 73 | Ht 61.0 in | Wt 182.0 lb

## 2016-07-07 DIAGNOSIS — M5126 Other intervertebral disc displacement, lumbar region: Secondary | ICD-10-CM

## 2016-07-07 NOTE — Progress Notes (Signed)
Office Visit Note   Patient: [name] [name]           Date of Birth: Dec 05, 1960           MRN: [id number] Visit Date: 07/07/2016              Requested by: [name], MD [address] [place], [place] [address] PCP: [name], MD   Assessment & Plan: Visit Diagnoses:  1. Protrusion of lumbar intervertebral disc     Plan: Patient's Has received relief with her knee injection and knee is working much better. CT scan lumbar shows solid fusion L4-S1. Disc above L3-4 above the fusion shows some disc protrusion with facet hypertrophy. No significant stenosis is noted. We'll continue conservative treatment and recheck her in 6 months. She'll continue walking program gradual strengthening exercises.  Follow-Up Instructions: Return in about 6 months (around 01/07/2017).   Orders:  No orders of the defined types were placed in this encounter.  No orders of the defined types were placed in this encounter.     Procedures: No procedures performed   Clinical Data: No additional findings.   Subjective: Chief Complaint  Patient presents with  . Lower Back - Pain  . Right Knee - Pain    HPI patient returns for follow-up with the persistent back pain after 2 level lumbar fusion 2009. She has some discomfort in her neck as well and states the knee is better since the 06/15/2006 injection. She had one fall back in March none since that time. Previous CVA with some right side hemiparesis. She sees a cane in the past off and on. She has posterior neck pain less pain in the right knee. Patient sees some Robaxin with improvement.  Review of Systems 14 part review of systems updated from 06/14/2016. This shows improvement in her knee pain after injection. Back pain is stable after L4-S1 fusion. CT scan has been performed and is available for review. ROS otherwise negative as it pertains history of present illness.   Objective: Vital Signs: BP 132/75   Pulse 73   Ht  5\' 1"  (1.549 m)   Wt 182 lb (82.6 kg)   BMI 34.39 kg/m   Physical Exam  Constitutional: She is oriented to person, place, and time. She appears well-developed.  HENT:  Head: Normocephalic.  Right Ear: External ear normal.  Left Ear: External ear normal.  Eyes: Pupils are equal, round, and reactive to light.  Neck: No tracheal deviation present. No thyromegaly present.  Cardiovascular: Normal rate.   Pulmonary/Chest: Effort normal.  Abdominal: Soft.  Neurological: She is alert and oriented to person, place, and time.  Skin: Skin is warm and dry.  Psychiatric: She has a normal mood and affect. Her behavior is normal.   patient has mild antalgic gait. Some tenderness lumbosacral palpation well-healed lumbar incision. Good range of motion of the shoulders elbows and hands. No synovitis of the hands. Negative straight leg raising knee and ankle jerk are intact. She has trapezial tenderness negative Spurling. Brachioplexus is nontender good flexion-extension.  Ortho Exam  Specialty Comments:  No specialty comments available.  Imaging: Study Result   CLINICAL DATA:  Low back pain. Other spondylosis with radiculopathy, lumbar region.  EXAM: CT LUMBAR SPINE WITHOUT CONTRAST  TECHNIQUE: Multidetector CT imaging of the lumbar spine was performed without intravenous contrast administration. Multiplanar CT image reconstructions were also generated.  COMPARISON:  CT of the lumbar spine 10/30/2008  FINDINGS: Segmentation: 5 non rib-bearing lumbar  type vertebral bodies are present.  Alignment: AP alignment is anatomic.  Vertebrae: Vertebral body heights are normal. No acute or healing fractures are present.  Paraspinal and other soft tissues: A 4.6 mm nonobstructing stone at the lower pole of the left kidney has slightly increased in size. No other focal lesions are present. There is minimal atherosclerotic calcification in the distal aorta without aneurysm. No  significant adenopathy is present.  Disc levels: Solid fusion is present at L4-5 and L5-S1. Hardware is intact.  T12-L1:  Negative.  L1-2:  Negative.  L2-3:  Negative.  L3-4: Mild disc bulging and similar to the prior study. There is mild adjacent level facet hypertrophy. No significant focal stenosis is evident.  L4-5: Solid fusion without evidence for residual or recurrent stenosis.  L5-S1: Solid fusion without evidence for significant stenosis. Endplate spurring extending into the left foramen is stable.  IMPRESSION: 1. Solid fusion at L4-5 and L5-S1 without residual or recurrent stenosis at these levels. 2. Stable adjacent level disease with a mild broad-based disc protrusion and bilateral facet hypertrophy at L3-4 but no significant stenosis. 3. Enlarging nonobstructing stone at the lower pole of the left kidney.   Electronically Signed   By: Marin Robertshristopher  Mattern M.D.   On: 06/22/2016 15:08       PMFS History: Patient Active Problem List   Diagnosis Date Noted  . Dyspepsia 07/13/2013  . Smoking 07/13/2013  . Diarrhea 07/13/2013  . 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 Goiter 06/25/2012  . Hypercholesterolemia 02/25/2012  . Diverticulitis   . Galactorrhea syndrome   . Urinary calculus   . Thyroid disease   . Vertigo   . Hypertension   . GERD (gastroesophageal reflux disease)   . Stroke (HCC)   . Patent foramen ovale   . Migraine headache   . Visual changes   . Allergy to Coumadin   . Thyroiditis, autoimmune   . Hemiparesis, right (HCC)   . Memory problem   . Ejection fraction    Past Medical History:  Diagnosis Date  . Allergy to Coumadin    Not an allergy but pt declines Coumadin  . Diverticulitis    August, 2013  . Ejection fraction    EF normal by bedside echo per Dr. Andee LinemaneGent, office visit, July, 2012  . Fatigue   . Galactorrhea syndrome   . GERD (gastroesophageal reflux disease)   . Hemiparesis, right (HCC)     . Hypertension   . Hypothyroidism, acquired, autoimmune   . Memory problem   . Migraine headache    Versus TIAs  . Patent foramen ovale    [facility]  25 mmGore Helix occluder device  . Stroke [facility](HCC)    Silent right brain stroke/cryptogenic stroke  . Thyroid disease    Hypothyroidism, post-radiation  . Thyroiditis, autoimmune   . Tobacco abuse   . Urinary calculus   . Vertigo   . Visual changes     Family History  Problem Relation Age of Onset  . Stroke Other   . Hypertension Other   . Diabetes Mother   . Diabetes Father   . Thyroid disease Maternal Aunt   . Diabetes Maternal Grandmother   . Diabetes Paternal Grandmother   . Cancer Paternal Grandmother     Past Surgical History:  Procedure Laterality Date  . I-131 TREATMENT  05/25/2006  . TONSILLECTOMY AND ADENOIDECTOMY    . TOTAL ABDOMINAL HYSTERECTOMY  1997   No bso   Social  History   Occupational History  . Conservation officer, [employer]   Social History Main Topics  . Smoking status: Current Every Day Smoker    Packs/day: 1.00    Years: 35.00    Types: Cigarettes  . Smokeless tobacco: Never Used  . Alcohol use 0.0 oz/week     Comment: Rarely  . Drug use: No  . Sexual activity: Not on file

## 2016-08-01 ENCOUNTER — Ambulatory Visit (INDEPENDENT_AMBULATORY_CARE_PROVIDER_SITE_OTHER): Payer: Medicare Other | Admitting: "Endocrinology

## 2016-08-01 VITALS — BP 124/60 | HR 86 | Wt 185.4 lb

## 2016-08-01 DIAGNOSIS — R0981 Nasal congestion: Secondary | ICD-10-CM | POA: Diagnosis not present

## 2016-08-01 DIAGNOSIS — F17200 Nicotine dependence, unspecified, uncomplicated: Secondary | ICD-10-CM | POA: Diagnosis not present

## 2016-08-01 DIAGNOSIS — E7800 Pure hypercholesterolemia, unspecified: Secondary | ICD-10-CM

## 2016-08-01 DIAGNOSIS — R600 Localized edema: Secondary | ICD-10-CM | POA: Diagnosis not present

## 2016-08-01 DIAGNOSIS — I10 Essential (primary) hypertension: Secondary | ICD-10-CM | POA: Diagnosis not present

## 2016-08-01 DIAGNOSIS — R1013 Epigastric pain: Secondary | ICD-10-CM | POA: Diagnosis not present

## 2016-08-01 DIAGNOSIS — E063 Autoimmune thyroiditis: Secondary | ICD-10-CM | POA: Diagnosis not present

## 2016-08-01 DIAGNOSIS — Z6835 Body mass index (BMI) 35.0-35.9, adult: Secondary | ICD-10-CM

## 2016-08-01 DIAGNOSIS — E049 Nontoxic goiter, unspecified: Secondary | ICD-10-CM

## 2016-08-01 DIAGNOSIS — R7309 Other abnormal glucose: Secondary | ICD-10-CM

## 2016-08-01 DIAGNOSIS — R0683 Snoring: Secondary | ICD-10-CM

## 2016-08-01 DIAGNOSIS — E890 Postprocedural hypothyroidism: Secondary | ICD-10-CM | POA: Diagnosis not present

## 2016-08-01 LAB — T4, FREE: Free T4: 1 ng/dL (ref 0.8–1.8)

## 2016-08-01 LAB — LIPID PANEL
Cholesterol: 283 mg/dL — ABNORMAL HIGH (ref <200)
HDL: 83 mg/dL (ref >50)
LDL Cholesterol: 180 mg/dL — ABNORMAL HIGH (ref <100)
Total CHOL/HDL Ratio: 3.4 Ratio (ref <5.0)
Triglycerides: 101 mg/dL (ref <150)
VLDL: 20 mg/dL (ref <30)

## 2016-08-01 LAB — T3, FREE: T3, Free: 2.2 pg/mL — ABNORMAL LOW (ref 2.3–4.2)

## 2016-08-01 LAB — POCT GLUCOSE (DEVICE FOR HOME USE): POC Glucose: 89 mg/dl (ref 70–99)

## 2016-08-01 LAB — TSH: TSH: 7.1 m[IU]/L — AB

## 2016-08-01 LAB — POCT GLYCOSYLATED HEMOGLOBIN (HGB A1C): HEMOGLOBIN A1C: 5.5

## 2016-08-01 NOTE — Progress Notes (Signed)
CC: Follow-up hypothyroid s/p I-131 treatment, hypertension, goiter, dyspepsia, fatigue, GERD, Hashimoto's disease, status post CVA, partial right hemiparesis, hyperlipidemia, smoking, and supraclavicular fullness    History of present illness: The patient is a 56 year old Caucasian woman.   1. The patient first presented to me on 08/10/04 in referral from her primary care provider, Dr. Frederica Kuster in [place], [place], for evaluation and management of hyperthyroidism. In retrospect, the patient had chronic problems with infected sinuses, inner ear symptoms, and spinning dizziness. She also had complaints of severe fatigue and weight gain. Dr. Jonette Eva had performed thyroid tests on 06/14/04. TSH was 0.13, free T4 1.04. Thyroid ultrasound showed small and large nodules in a thyroid gland that was somewhat enlarged overall. On examination she had a 20+ gram thyroid gland. Thyroid gland was firm, but nontender. She also had some diastolic hypertension. Thyroid tests showed a TSH of 0.437, free T4 1.01, and free T3 of 2.6. Her TPO antibody level was 29 9. Her TSI level was 98. It appeared at that time that the patient likely had some elevation of thyroid hormone due to Hashimoto's disease or had a combination of intermittently active Hashimoto's Disease and intermittently active  Graves' Disease.   2. During the next 2 years, her TSH values varied from 0.350-0.854. By February of 2008, however, her TSH decreased to 0.008, her free T4 increased to 2.08, and her free T3 increased to 5.8. She was clearly hyperthyroid and thyrotoxic. TSI level at that point was 1.8 with normal being 1.3 or less. On 05/25/06 she underwent radioactive iodine treatment with I-131. She subsequently became hypothyroid and was started on Synthroid replacement therapy. Since then her doses of Synthroid have been adjusted several times.   3. During the past 10 years the patient has had several other medical problems:  A. She has had one or more  CVAs/TIAs, which have resulted in some mild right hemiparesis and some transient memory losses. Her MRI of the brain in February 2010 showed evidence for remote infarcts involving the posterior right temporal lobe and parietal lobe.    B. She also had a 3-4 month episode of spontaneous galactorrhea in 2010. Brain MRI showed a 2 x 3 mm area of delayed enhancement seen posteriorly on the left side of her pituitary gland. I felt at the time that it would be very uncharacteristic for a true prolactinoma this small to actually produce hyperprolactinemia and clinical galactorrhea. The galactorrhea subsequently resolved spontaneously.   C. On 08/02/2010 she presented with increased swelling of both supraclavicular areas. A CT scan was performed which showed no significant pathology. She was evaluated by Dr. Christia Reading of [facility]. Dr. Jenne Pane could not identify any cause of the supraclavicular area swelling other than obesity. That swelling has since waxed and waned with her changes in weight.    D. The patient was started on Crestor for hyperlipidemia, but initially could not tolerate even the 5 mg dose more than twice per week. She was subsequently started on Zetia, 10 mg/day, which she tolerated better, but did not control her lipids as well. Crestor was later re-started at a lower dose and she was able to tolerate that medication. However, she later discontinued the Crestor due to symptoms.   E. She has had multiple GI complaints over time, to include severe, chronic diarrhea, heartburn, and abdominal pains. In the Summer of 2015 she saw a gastroenterologist who performed a colonoscopy and removed a polyp. He also put her on a gluten-free and casein-free  diet. Her symptoms improved on that diet, but she has not been consistent with that diet for quite some time.    3. The patient's last [facility] visit was on 04/25/16. After that visit I decreased her Synthroid dose to 137 mcg/day for 5 days each week, with no  pill on two days each week.   A. She has been healthy for the most part, but recently developed increased nasal congestion, mild headache,  non-productive cough, ear fullness, maxillary sinus pressure, and feeling of pressure in her maxillary teeth. Her allergies may be acting up. She has also had a lot of work done at her house with new sheetrock and paint. She did not see her PCP because she was coming here today.  B. She continues to have intermittent diarrhea that varies in frequency and severity over time. She is still followed by GI for this problem. She also has fairly consistent joint and soft tissue aches and pains. She still has one kidney stone, but has not had any further episodes of passing stone fragments. She has not had any further stroke symptoms. Her neurologist continued her on Plavix, 75 mg/day.    C.   She does not consistently follow her gluten-free and casein-free diet. She is not sure if her diarrhea and her diet are related.   D. She stopped taking fish oil due to indigestion. She does take Tums, Centrum Silver, and calcium once daily. She has still been eating out a lot due to having home repairs done almost continuously.  The repairs will be Completed in about 1-2 weeks.  E. She is not smoking as much, about 1/4 pack per day.   F. She has not had her sleep apnea evaluation because she does not want to wear a C-pap device like her mother wore many years ago.   G. Her allergies continue to act up intermittently. She still has occasional problems swallowing when her allergies are worse or when she eats certain spices.  3. Pertinent Review of Systems: Constitutional: She says, "I'm tired and my head is stopped up." She still snores "like a freight train" and still frequently wakes up choking .  Eyes: Near vision is good with her new glasses. She had an eye exam in march 2018. She was told that she had the beginning stages of macular degeneration. She also had the beginning of  cataracts. Her eyes showed signs of high blood pressure. She has a follow up exam scheduled in December.   Neck: She was told in about 2010 or 2011 that her UGI showed a spastic esophagus. She has no complaints of anterior neck swelling, soreness, tenderness, pressure, discomfort, or difficulty swallowing. She continues to have episodic trapezius spasm and pinching of the cervical nerves.       Heart: She occasionally feels faster heart beats which she notes when she is unusually active or when she is lying on her left side. 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 Her cardiologist confirmed that diagnosis at her follow up appointment in September or October 2016. The patient has no other complaints of irregular heat beats, chest pain, or chest pressure. Gastrointestinal: She has normal stools some days, but watery or mushy stools on most days. Her GERD and heartburn are better when she avoids spicy foods.   Legs: She occasionally has the sensation of something crawling on her skin in the upper right thigh and discomfort of her right  knee cap. Muscle mass and strength seem unchanged. Her right side remains somewhat weaker than her left. She still notes some burning of her calves at times. She occasionally has some edema of the lower calves and ankles.  Feet: She sometimes has numbness, burning, and "tickling" in her feet, more often on the right foot. She notes some edema at times. Her right leg has become stronger over time, so she rarely drags her right foot anymore.  Neuro: She has persistent bilateral numbness and in her thumbs and first two fingers bilaterally.  Mental/psychological: "My brain is fine, I reckon."  Her abilities to think, to pay attention, to remember, and to make decisions are good.   REVIEW OF SYSTEMS: There are no other significant problems involving her other body systems.  PAST MEDICAL, FAMILY, AND SOCIAL HISTORY:  1. Family:  She still lives alone. She has more social interactions now with friends and family. She has two sisters with hypothyroidism. Her daughter is also having thyroid problems. Her home renovations will probably continue through next week, or perhaps longer.  2. Activities: She has not been physically active.   3. Tobacco: She is still smoking.   4. Primary care provider: Dr. Donnita Falls, MD in [place].  5. Neurologist: Dr. Lacey Jensen, [facility], phone [phone]  PHYSICAL EXAM: BP: 124/60, HR 86, weight 185 pounds and 6.4 ounces. Her weight has increased almost 4 pounds.  Constitutional: The patient looks healthy and alert today. Her sense of humor is good. Her insight and affect are normal. Her memory is clinically normal. She is quite mentally sharp today.  Face: Her maxillary sinuses are not tender to palpation.  Eyes: There is no arcus or proptosis.  Mouth: The oropharynx appears normal. The tongue appears normal, but is tobacco stained. There is normal oral moisture. There is no obvious gingivitis. Neck: There are no bruits present. The thyroid gland appears normal in size. The thyroid gland is again normal at approximately 18-20 grams in size. Both lobes are within normal size today. The consistency of the thyroid gland is normal. There is no thyroid tenderness to palpation. Her previous bilateral supraclavicular fullness is not present today. Her trapezius muscles are tight and tender.  Lungs: The lungs are clear anteriorly and posteriorly. Air movement is good. Heart: The heart rhythm and rate appear normal. Heart sounds S1 and S2 are normal. I do not appreciate any pathologic heart murmurs. Abdomen: The abdomen is enlarged. Bowel sounds are normal. The abdomen is soft and non-tender. There is no obviously palpable hepatomegaly, splenomegaly, or other masses. She is tender in the LLQ today.     Arms: Muscle mass appears appropriate for age.  Hands: She has no tremor  of her right hand today, but only trace tremor of her left hand. Phalangeal and metacarpophalangeal joints appear normal. Palms are normal. Legs: Muscle mass appears appropriate for age. There is no edema today.  Neurologic: Right hand grip is slightly weaker than the left handgrip. Right hip flexors are weaker than the left hip flexors. Sensation to touch is intact in her legs.     LABS:   Labs 08/01/16: HbA1c 5.5%  Labs 07/06/16: CBC normal, except RBC 3.96 (ref 4.04-5.48) and MCH 32.3 (ref 27-31.2), CMP normal  Labs 04/25/16: HbA1c 5.3%, CBG 105  Labs 01/21/16: HbA1c 5.8%; calcium 9.4, PTH 52, 25-OH vitamin D 35; TSH 0.01, free T4 1.8, free T3 3.8; cholesterol 239, triglycerides 65, HDL 75, LDL 151; CMP normal  Labs 8/84/16:  TSH 5.01, free T4 1.45, TPO antibody <1, anti-thyroglobulin antibody <1; CMP normal, with calcium 10.0; CBC normal  Labs 04/27/15: HbA1c 5.5%; TSH 3.61, free T4 1.4, free T3 2.2; cholesterol 258, triglycerides 96, HDL 77, LDL 162; PTH 32, calcium 10.0, 25-OH vitamin d 33  Labs 01/12/15: TSH 0.885, free T4 1.88, free T3 3.01; CBC: Normal except MCV 98; CMP normal; vitamin B12 (344 (normal 239-931), folic acid 19.7 (normal >5.4); PTH 80.49 (normal 12-72), calcium 9.10 (normal 8.5-10.6), 25-OH vitamin D 32.6; cholesterol 231, triglycerides 83, HDL 66, LDL 148  Labs 09/24/14: Ordered, but not performed  Labs 05/21/14: TSH 0.490, free T4 1.46, free T3 2.4; HbA1C 5.8%; calcium 9.3, PTH 52; 25-OH vitamin D 40; cholesterol 246, triglycerides 92, HDL 70, LDL 176; calcitonin <2  Labs 02/09/14: TSH 2.96, free T4 1.43, free T3 2.2  Labs 02/05/14: Urinalysis: large blood, 15 ketones; alkaline phosphatase 108 (normal 32-92);   Labs 02/02/14: Urinalysis: trace blood, no ketones; Hgb 13.6, Hct 43.9, MCV 100.3; CMP normal, with alkaline phosphatase 16 (normal 40-129); TSH 3.03; B12 305 (211-946); cholesterol 282, triglycerides 68, HDL 79, LDL 189; 25-hydroxy vitamin D >60  Labs  07/11/13: B12 236 (normal 211-911), calcitonin < 2 (normal < 6)  Labs 03/10/13: CMP normal. TSH 1.456, free T4 1.36, free T3 2.6; cholesterol 194, triglycerides 82, HDL 72, LDL 106  Labs 11/18/13: TSH 0.539, free T4 1.30, free T3 2.6  Labs 10/16/12: CMP normal; TSH 2.20, free T4 1.10, free T3 2.2; cholesterol 202, triglycerides 77, HDL 82, LDL 105  Labs 06/18/12: TSH 1.260, free T4 1.58, free T3 2.5, cholesterol 211, triglycerides 71, HDL 78, LDL 119  Labs 02/13/12: TSH 0.235, free T4 1.83, cholesterol 236, triglycerides 85, HDL 68, LDL 151. Had been back on Crestor for about 3 weeks, but was off it for about 7 days prior to the labs being drawn.   Labs 01/09/12: AST 45, ALT 70, cholesterol 298, triglycerides 85, HDL 72, LDL 209, TSH 0.44, free T4 1.52, 25-hydroxy,  vitamin D 41. Had been off Crestor for months.   Labs 09/06/11: TSH 0.663, free T4 1.65, T3 117, cholesterol 225, triglycerides 65, HDL 84, LDL 128  Labs 05/09/11: TSH 9.147. Free T4: 1.66. CMP is normal. Cholesterol 214, triglycerides 67, HDL was 73, LDL 128. 25-OH-vitamin D 41.7. She started Zetia as of  05/15/11.  ASSESSMENT: 1. Supraclavicular fullness: The swelling in the supraclavicular fossae has improved over time and is not evident today. It appears that the swelling was just fatty tissue.    2. Obesity: Her weight has increased by almost 4 pounds. Her GI problems and her arthritis make it difficult for her to exercise. Swimming helps when she does it.      3. Hypertension: BP is good today. She still needs daily exercise.  3. Hypothyroid, s/p I-131:   A. She was euthyroid in January 2015.  She was borderline hyperthyroid in March 2016. Her TFTs in November 2016 were essentially normal, although her free T4 was a bit elevated. Her TFTs in May 2017 were low, so I increased her Synthroid dose to 137 mcg/day.   B. At her last visit she was still hyperthyroid, so I reduced her Synthroid dosage appropriately.  We need to re-check her  TFTs now.  4. Hypercholesterolemia: The patient's most recent total cholesterol and LDL in November 2017 were lower than in February 2017, but still too high. She stopped taking fish oil due to indigestion.  5. Goiter: Thyroid gland is again  within normal limits for size today.   6. Thyroiditis: Hashimoto's disease is clinically quiescent today. The non-physiologic shifts in her TFTs at times are c/w intermittent Hashimoto's Dz activity. 7. Dyspepsia/GERD: Her dyspepsia and GERD vary depending upon her diet, her compliance/noncompliance with taking omeprazole, and her tobacco intake.    8. Tobacco: At this point in time she is not very motivated to stop smoking. I once again asked her to stop smoking. She again politely told me that she will try to stop, but that it is very hard to do so.  I again reminded her that she has been able to recover fairly well from her previous stroke(s). She may not be so lucky the next time. If she wants to preserve her brain tissue and her abilities to be active and to live  Independently, she needs to stop smoking.  9. Edema: She does not have any edema today. She sometimes takes in more salt than she needs.  10. 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 When she was on the diet her diarrhea stopped. Her calcitonin was negative, ruling out hypercalcitoninemia as a cause of her diarrhea. Due to her home being renovated and her eating out a lot, she has not been motivated to resume a gluten-free or casein-free diet. She continues to be followed by GI.  11. Excess vitamin D: Her vitamin D levels in 2016 and again in 2017 were normal.  12. S/p CVA:  Her MRI on 04/16/08 showed CNS defects in the right cerebrum. Her neurologic defects, however, were on the right side of her body. She may have had more CNS damage in her left cerebrum than had been recognized.  She seems to have recovered fairly well, but still has some right hemiparetic findings. 13.  Hyperparathyroidism, secondary to inadequate calcium intake: Her PTH and calcium values in November 2017 were good. She needs to continue current doses of calcium, MVI, and vitamin D. 14. Snoring: I suspect that she has OSA. I asked her again to obtain a sleep apnea study up in [place].  15. Nasal congestin and associated symptoms: It appears that her allergies are acting up again.  16: Elevated HbA1c: Her HbA1c is within normal limits today, but at the upper end of the normal range.   PLAN: 1. Diagnostic: Repeat TFTs and fasting lipids.  2. Therapeutic: Continue Synthroid dose of 137 mcg/day for 5 days each week for now.  Continue the calcium and  Centrum silver.  Stop smoking. Take Tums as above. 3. Patient education: We discussed her GI problems, her thyroiditis and hypothyroidism, and her neuro issues. I asked her again to stop smoking.  4. Follow-up: Follow up visit in 3 months.  Level of Service: This visit lasted in excess of 50 minutes. More than 50% of the visit was devoted to counseling.  Molli KnockMichael Brennan, MD, CDE Adult and Pediatric Endocrinology

## 2016-08-01 NOTE — Patient Instructions (Signed)
Follow up visit in 3 months. 

## 2016-08-02 ENCOUNTER — Encounter (INDEPENDENT_AMBULATORY_CARE_PROVIDER_SITE_OTHER): Payer: Self-pay | Admitting: "Endocrinology

## 2016-08-14 ENCOUNTER — Encounter (INDEPENDENT_AMBULATORY_CARE_PROVIDER_SITE_OTHER): Payer: Self-pay

## 2016-10-05 ENCOUNTER — Telehealth (INDEPENDENT_AMBULATORY_CARE_PROVIDER_SITE_OTHER): Payer: Self-pay | Admitting: "Endocrinology

## 2016-10-05 NOTE — Telephone Encounter (Signed)
°  Who's calling (name and relationship to patient) : Jerilynn Magesatricia Best contact number: [phone] Provider they see: Fransico MichaelBrennan Reason for call: Patient called stated that her thyroid level were off, will it effect her colonoscopy??    Please call.      PRESCRIPTION REFILL ONLY  Name of prescription:  Pharmacy:

## 2016-10-05 NOTE — Telephone Encounter (Signed)
Routed to provider

## 2016-10-05 NOTE — Telephone Encounter (Signed)
1. Ms [name] [name] called earlier with a question about her lab tests and her upcoming colonoscopy. 2. When I returned her call she told me that she was concerned that since she was hypothyroid in May it might not be good for her to have a colonoscopy before she comes back to see me on 10/27/16 and has her lab tests repeated. She had already decided to postpone the colonoscopy until late October.  3. I told her that her it is likely that her thyroid tests have already normalized. However, her decision was reasonable. We will review her lab tests later this month and make any adjustments in her Synthroid dosage that may be needed at that time.  Molli KnockMichael Brennan, MD, CDE

## 2016-11-01 ENCOUNTER — Ambulatory Visit (INDEPENDENT_AMBULATORY_CARE_PROVIDER_SITE_OTHER): Payer: Medicare Other | Admitting: "Endocrinology

## 2016-11-01 VITALS — BP 108/74 | HR 88 | Wt 188.8 lb

## 2016-11-01 DIAGNOSIS — R1013 Epigastric pain: Secondary | ICD-10-CM | POA: Diagnosis not present

## 2016-11-01 DIAGNOSIS — E890 Postprocedural hypothyroidism: Secondary | ICD-10-CM | POA: Diagnosis not present

## 2016-11-01 DIAGNOSIS — J301 Allergic rhinitis due to pollen: Secondary | ICD-10-CM

## 2016-11-01 DIAGNOSIS — E669 Obesity, unspecified: Secondary | ICD-10-CM

## 2016-11-01 DIAGNOSIS — K529 Noninfective gastroenteritis and colitis, unspecified: Secondary | ICD-10-CM | POA: Diagnosis not present

## 2016-11-01 DIAGNOSIS — E049 Nontoxic goiter, unspecified: Secondary | ICD-10-CM | POA: Diagnosis not present

## 2016-11-01 DIAGNOSIS — R7303 Prediabetes: Secondary | ICD-10-CM | POA: Diagnosis not present

## 2016-11-01 DIAGNOSIS — E7800 Pure hypercholesterolemia, unspecified: Secondary | ICD-10-CM

## 2016-11-01 DIAGNOSIS — E673 Hypervitaminosis D: Secondary | ICD-10-CM

## 2016-11-01 DIAGNOSIS — I10 Essential (primary) hypertension: Secondary | ICD-10-CM | POA: Diagnosis not present

## 2016-11-01 DIAGNOSIS — K219 Gastro-esophageal reflux disease without esophagitis: Secondary | ICD-10-CM

## 2016-11-01 DIAGNOSIS — Z9109 Other allergy status, other than to drugs and biological substances: Secondary | ICD-10-CM

## 2016-11-01 DIAGNOSIS — F17200 Nicotine dependence, unspecified, uncomplicated: Secondary | ICD-10-CM

## 2016-11-01 DIAGNOSIS — E063 Autoimmune thyroiditis: Secondary | ICD-10-CM | POA: Diagnosis not present

## 2016-11-01 DIAGNOSIS — E211 Secondary hyperparathyroidism, not elsewhere classified: Secondary | ICD-10-CM

## 2016-11-01 LAB — POCT GLUCOSE (DEVICE FOR HOME USE): POC Glucose: 84 mg/dl (ref 70–99)

## 2016-11-01 LAB — POCT GLYCOSYLATED HEMOGLOBIN (HGB A1C): Hemoglobin A1C: 5.7

## 2016-11-01 NOTE — Progress Notes (Signed)
CC: Follow-up hypothyroid s/p I-131 treatment, hypertension, goiter, dyspepsia, fatigue, GERD, Hashimoto's disease, status post CVA, partial right hemiparesis, hyperlipidemia, smoking, and supraclavicular fullness    History of present illness: The patient is a 56 year old Caucasian woman.   1. The patient first presented to me on 08/10/04 in referral from her primary care provider, Dr. Frederica Kuster in [place], [place], for evaluation and management of hyperthyroidism. In retrospect, the patient had chronic problems with infected sinuses, inner ear symptoms, and spinning dizziness. She also had complaints of severe fatigue and weight gain. Dr. Jonette Eva had performed thyroid tests on 06/14/04. TSH was 0.13, free T4 1.04. Thyroid ultrasound showed small and large nodules in a thyroid gland that was somewhat enlarged overall. On examination she had a 20+ gram thyroid gland. Thyroid gland was firm, but nontender. She also had some diastolic hypertension. Thyroid tests showed a TSH of 0.437, free T4 1.01, and free T3 of 2.6. Her TPO antibody level was 29 9. Her TSI level was 98. It appeared at that time that the patient likely had some elevation of thyroid hormone due to Hashimoto's disease or had a combination of intermittently active Hashimoto's Disease and intermittently active  Graves' Disease.   2. During the next 2 years, her TSH values varied from 0.350-0.854. By February of 2008, however, her TSH decreased to 0.008, her free T4 increased to 2.08, and her free T3 increased to 5.8. She was clearly hyperthyroid and thyrotoxic. TSI level at that point was 1.8 with normal being 1.3 or less. On 05/25/06 she underwent radioactive iodine treatment with I-131. She subsequently became hypothyroid and was started on Synthroid replacement therapy. Since then her doses of Synthroid have been adjusted several times.   3. During the past 10 years the patient has had several other medical problems:  A. She has had one or more  CVAs/TIAs, which have resulted in some mild right hemiparesis and some transient memory losses. Her MRI of the brain in February 2010 showed evidence for remote infarcts involving the posterior right temporal lobe and parietal lobe.    B. She also had a 3-4 month episode of spontaneous galactorrhea in 2010. Brain MRI showed a 2 x 3 mm area of delayed enhancement seen posteriorly on the left side of her pituitary gland. I felt at the time that it would be very uncharacteristic for a true prolactinoma this small to actually produce hyperprolactinemia and clinical galactorrhea. The galactorrhea subsequently resolved spontaneously.   C. On 08/02/2010 she presented with increased swelling of both supraclavicular areas. A CT scan was performed which showed no significant pathology. She was evaluated by Dr. Christia Reading of [facility]. Dr. Jenne Pane could not identify any cause of the supraclavicular area swelling other than obesity. That swelling has since waxed and waned with her changes in weight.    D. The patient was started on Crestor for hyperlipidemia, but initially could not tolerate even the 5 mg dose more than twice per week. She was subsequently started on Zetia, 10 mg/day, which she tolerated better, but did not control her lipids as well. Crestor was later re-started at a lower dose and she was able to tolerate that medication. However, she later discontinued the Crestor due to symptoms.   E. She has had multiple GI complaints over time, to include severe, chronic diarrhea, heartburn, and abdominal pains. In the Summer of 2015 she saw a gastroenterologist who performed a colonoscopy and removed a polyp. He also put her on a gluten-free and casein-free  diet. Her symptoms improved on that diet, but she has not been consistent with that diet for quite some time.    3. The patient's last [facility] visit was on 08/01/16. After that visit I increased her Synthroid dose to 137 mcg/day for 6 days each week, with no  pill on one day each week.   A. She has been healthy for the most part. She had antibiotics and her sinus problems cleared up. She also had a botched dental procedure.   B. She continues to have intermittent diarrhea that varies in frequency and severity over time. She is still followed by GI for this problem. She is due to have a colonoscopy soon. She also has fairly consistent joint and soft tissue aches and pains. She still has one kidney stone, but has not had any further episodes of passing stone fragments. She has not had any further stroke symptoms. Her neurologist continued her on Plavix, 75 mg/day.    C.   She has been eating more and does not consistently follow her gluten-free and casein-free diet. She is not sure if her diarrhea and her diet are related.   D. She does take Tums, Centrum Silver, and calcium once daily.   E. She is still smoking.   F. She still has not had her sleep apnea evaluation because she does not want to wear a C-pap device like her mother wore many years ago.   G. Her allergies have been well controled by Zyrtec.   3. Pertinent Review of Systems: Constitutional: She says, "I feel pretty good today." She still snores "like a freight train" and still frequently wakes up choking .  Eyes: Near vision is good with her new glasses. She had an eye exam in March 2018. She was told that she had the beginning stages of macular degeneration. She also had the beginning of cataracts. Her eyes showed signs of high blood pressure. She has a follow up exam scheduled in December.   Neck: She continues to have intermittent problems with swallowing. 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 She has no complaints of anterior neck swelling, soreness, tenderness, pressure, or discomfort. She continues to have episodic trapezius spasm and pinching of the cervical nerves.   Heart: She occasionally feels faster heart beats which she notes when she is unusually  active, especially in the heat, or when she is lying on her left side. 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 Her cardiologist confirmed that diagnosis at her follow up appointment in September or October 2016. The patient has no other complaints of irregular heat beats, chest pain, or chest pressure. Gastrointestinal: She has normal stools some days, but watery or mushy stools on most days. Her GERD and heartburn are better when she avoids spicy foods.   Legs: She occasionally has the sensation of something crawling on her skin in the upper right thigh and discomfort of her right knee cap. Muscle mass and strength seem unchanged. Her right side remains somewhat weaker than her left. She still notes some burning of her calves at times. She occasionally has some edema of the lower calves and ankles.  Feet: She sometimes has numbness, burning, and "tickling" in her feet, more often on the right foot. She notes some edema at times. Her right leg has become stronger over time, so she rarely drags her right foot anymore.  Neuro:  She has persistent bilateral numbness and in her thumbs and first two fingers bilaterally.  Mental/psychological: "My brain is all right. I ain't eating checkers yet." Her abilities to think, to pay attention, to remember, and to make decisions are good.   REVIEW OF SYSTEMS: There are no other significant problems involving her other body systems.  PAST MEDICAL, FAMILY, AND SOCIAL HISTORY:  1. Family: She still lives alone. She has more social interactions now with friends and family. She has two sisters with hypothyroidism. Her daughter is also having thyroid problems.  2. Activities: She has not been very physically active. She does walk occasionally, but her diarrhea limits her. Her major physical activity is eating.  3. Tobacco: She is still smoking.   4. Primary care provider: Dr. Donnita Falls, MD in [place].  5.  Neurologist: Dr. Lacey Jensen, [facility], phone [phone]  PHYSICAL EXAM: BP: 108/74, HR 88, weight 18 pounds and 12.8 ounces. Her weight has increased 3.5 pounds.  Constitutional: The patient looks healthy and alert today. Her sense of humor is good. Her insight and affect are normal. Her memory is clinically normal. She is quite mentally sharp today.  Face: Her face appears normal.   Eyes: There is no arcus or proptosis.  Mouth: The oropharynx appears normal. The tongue appears normal, but is tobacco stained. There is normal oral moisture. There is no obvious gingivitis. Neck: There are no bruits present. The thyroid gland appears normal in size. The thyroid gland is again normal at approximately 18-20 grams in size. Both lobes are within normal size today. The consistency of the thyroid gland is normal. There is no thyroid tenderness to palpation. Her previous bilateral supraclavicular fullness is not present today.  Lungs: The lungs are clear anteriorly and posteriorly. Air movement is good. Heart: The heart rhythm and rate appear normal. Heart sounds S1 and S2 are normal. I do not appreciate any pathologic heart murmurs. Abdomen: The abdomen is more enlarged. Bowel sounds are normal. The abdomen is soft and non-tender. There is no obviously palpable hepatomegaly, splenomegaly, or other masses.      Arms: Muscle mass appears appropriate for age.  Hands: She has a 1+ tremor of her right hand today, but only trace tremor of her left hand. Phalangeal and metacarpophalangeal joints appear normal. Palms are normal. Legs: Muscle mass appears appropriate for age. There is no edema today.  Neurologic: Right hand grip is slightly weaker than the left handgrip. Right hip flexors are weaker than the left hip flexors. Sensation to touch is intact in her legs.     LABS:   Labs 11/01/16: HbA1c 5.7%, CBG 84  Labs 08/01/16: HbA1c 5.5%; TSH 7.10, free T4 1.0. Free T3 2.2; cholesterol  283, triglycerides 101, HDL 83, LDL 180  Labs 07/06/16: CBC normal, except RBC 3.96 (ref 4.04-5.48) and MCH 32.3 (ref 27-31.2), CMP normal  Labs 04/25/16: HbA1c 5.3%, CBG 105  Labs 01/21/16: HbA1c 5.8%; calcium 9.4, PTH 52, 25-OH vitamin D 35; TSH 0.01, free T4 1.8, free T3 3.8; cholesterol 239, triglycerides 65, HDL 75, LDL 151; CMP normal  Labs 0/98/11: TSH 5.01, free T4 1.45, TPO antibody <1, anti-thyroglobulin antibody <1; CMP normal, with calcium 10.0; CBC normal  Labs 04/27/15: HbA1c 5.5%; TSH 3.61, free T4 1.4, free T3 2.2; cholesterol 258, triglycerides 96, HDL 77, LDL 162; PTH 32, calcium 10.0, 25-OH vitamin d 33  Labs 01/12/15: TSH 0.885, free T4 1.88, free T3 3.01; CBC: Normal except MCV 98; CMP normal; vitamin  B12 (344 (normal 239-931), folic acid 19.7 (normal >5.4); PTH 80.49 (normal 12-72), calcium 9.10 (normal 8.5-10.6), 25-OH vitamin D 32.6; cholesterol 231, triglycerides 83, HDL 66, LDL 148  Labs 09/24/14: Ordered, but not performed  Labs 05/21/14: TSH 0.490, free T4 1.46, free T3 2.4; HbA1C 5.8%; calcium 9.3, PTH 52; 25-OH vitamin D 40; cholesterol 246, triglycerides 92, HDL 70, LDL 176; calcitonin <2  Labs 02/09/14: TSH 2.96, free T4 1.43, free T3 2.2  Labs 02/05/14: Urinalysis: large blood, 15 ketones; alkaline phosphatase 108 (normal 32-92);   Labs 02/02/14: Urinalysis: trace blood, no ketones; Hgb 13.6, Hct 43.9, MCV 100.3; CMP normal, with alkaline phosphatase 16 (normal 40-129); TSH 3.03; B12 305 (211-946); cholesterol 282, triglycerides 68, HDL 79, LDL 189; 25-hydroxy vitamin D >60  Labs 07/11/13: B12 236 (normal 211-911), calcitonin < 2 (normal < 6)  Labs 03/10/13: CMP normal. TSH 1.456, free T4 1.36, free T3 2.6; cholesterol 194, triglycerides 82, HDL 72, LDL 106  Labs 11/18/13: TSH 0.539, free T4 1.30, free T3 2.6  Labs 10/16/12: CMP normal; TSH 2.20, free T4 1.10, free T3 2.2; cholesterol 202, triglycerides 77, HDL 82, LDL 105  Labs 06/18/12: TSH 1.260, free T4  1.58, free T3 2.5, cholesterol 211, triglycerides 71, HDL 78, LDL 119  Labs 02/13/12: TSH 0.235, free T4 1.83, cholesterol 236, triglycerides 85, HDL 68, LDL 151. Had been back on Crestor for about 3 weeks, but was off it for about 7 days prior to the labs being drawn.   Labs 01/09/12: AST 45, ALT 70, cholesterol 298, triglycerides 85, HDL 72, LDL 209, TSH 0.44, free T4 1.52, 25-hydroxy,  vitamin D 41. Had been off Crestor for months.   Labs 09/06/11: TSH 0.663, free T4 1.65, T3 117, cholesterol 225, triglycerides 65, HDL 84, LDL 128  Labs 05/09/11: TSH 1.6101.700. Free T4: 1.66. CMP is normal. Cholesterol 214, triglycerides 67, HDL was 73, LDL 128. 25-OH-vitamin D 41.7. She started Zetia as of  05/15/11.  ASSESSMENT: 1. Supraclavicular fullness: The swelling in the supraclavicular fossae has improved over time and is not evident today. It appears that the swelling was just fatty tissue.    2. Obesity: Her weight has again increased by almost 4 pounds. Her GI problems and her arthritis make it difficult for her to exercise. Swimming helps when she does it.      3. Hypertension: BP is good today. She still needs daily exercise.  3. Hypothyroid, s/p I-131:   A. She was euthyroid in January 2015.  She was borderline hyperthyroid in March 2016. Her TFTs in November 2016 were essentially normal, although her free T4 was a bit elevated. Her TFTs in May 2017 were low, so I increased her Synthroid dose to 137 mcg/day.   B. At her last visit she was  hypothyroid, so I increased her Synthroid dosage appropriately.  We need to re-check her TFTs now.  4. Hypercholesterolemia: The patient's most recent total cholesterol and LDL in May 2018 2017 were higher than in November 2017. She needs to lose weight.  5. Goiter: Thyroid gland is again within normal limits for size today.   6. Thyroiditis: Hashimoto's disease is clinically quiescent today. The non-physiologic shifts in her TFTs at times are c/w intermittent  Hashimoto's Dz activity. 7. Dyspepsia/GERD: Her dyspepsia and GERD vary depending upon her diet, her compliance/noncompliance with taking omeprazole, and her tobacco intake.    8. Tobacco: At this point in time she is not motivated to stop smoking. I once again asked  her to stop smoking. She again politely told me that it is very hard to do so.  I again reminded her that she has been able to recover fairly well from her previous stroke(s). She may not be so lucky the next time. If she wants to preserve her brain tissue and her abilities to be active and to live  Independently, she needs to stop smoking.  9. Edema: She does not have any edema today. She sometimes takes in more salt than she needs.  10. 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 When she was on the diet her diarrhea stopped. Her calcitonin was negative, ruling out hypercalcitoninemia as a cause of her diarrhea. Due to her home being renovated and her eating out a lot, she has not been motivated to resume a gluten-free or casein-free diet. She continues to be followed by GI.  11. Excess vitamin D: Her vitamin D levels in 2016 and again in 2017 were normal.  12. S/p CVA:  Her MRI on 04/16/08 showed CNS defects in the right cerebrum. Her neurologic defects, however, were on the right side of her body. She may have had more CNS damage in her left cerebrum than had been recognized.  She seems to have recovered fairly well, but still has some right hemiparetic findings. 13. Hyperparathyroidism, secondary to inadequate calcium intake: Her PTH and calcium values in November 2017 were good. She needs to continue current doses of calcium, MVI, and vitamin D. 14. Snoring: I suspect that she has OSA. I asked her again to obtain a sleep apnea study up in [place].  15. Nasal congestin and associated symptoms: It appears that her allergies are well controlled by Zyrtec.   16: Prediabetes/Elevated HbA1c: Her HbA1c is back within the  prediabetes zone. She needs to reduce her carb intake.   PLAN: 1. Diagnostic: Repeat TFTs and fasting lipids.  2. Therapeutic: Continue Synthroid dose of 137 mcg/day for 5 days each week for now.  Continue the calcium and  Centrum silver.  Stop smoking. Take Tums as above. 3. Patient education: We discussed her GI problems, her thyroiditis and hypothyroidism, and her neuro issues. I reviewed the Eat  Right Diet with her and discussed the [facility] Diet recipes. I asked her again to stop smoking.  4. Follow-up: Follow up visit in 3 months.  Level of Service: This visit lasted in excess of 50 minutes. More than 50% of the visit was devoted to counseling.  Molli Knock, MD, CDE Adult and Pediatric Endocrinology

## 2016-11-01 NOTE — Patient Instructions (Signed)
Follow up visit in 3 months. 

## 2016-11-02 ENCOUNTER — Encounter (INDEPENDENT_AMBULATORY_CARE_PROVIDER_SITE_OTHER): Payer: Self-pay | Admitting: "Endocrinology

## 2016-11-02 LAB — T3, FREE: T3 FREE: 2.7 pg/mL (ref 2.3–4.2)

## 2016-11-02 LAB — TSH: TSH: 1.23 mIU/L

## 2016-11-02 LAB — T4, FREE: Free T4: 1.5 ng/dL (ref 0.8–1.8)

## 2016-11-02 LAB — VITAMIN D 25 HYDROXY (VIT D DEFICIENCY, FRACTURES): VIT D 25 HYDROXY: 31 ng/mL (ref 30–100)

## 2016-11-03 LAB — PTH, INTACT AND CALCIUM
CALCIUM: 9.1 mg/dL (ref 8.6–10.4)
PTH: 54 pg/mL (ref 14–64)

## 2016-11-17 ENCOUNTER — Telehealth (INDEPENDENT_AMBULATORY_CARE_PROVIDER_SITE_OTHER): Payer: Self-pay | Admitting: *Deleted

## 2016-11-17 NOTE — Telephone Encounter (Signed)
Spoke to patient, Thyroid tests are mid-normal. PTH, calcium,and vitamin D are normal, but the PTH is higher than we want and the calcium and vitamin D are lower than we want. Please take synthroid each morning. Please take both 2 Tums and Centrum silver twice daily, at dinner and at bedtime. We need to repeat her TFTs, calcium, PTH, and25-OH vitamin D one week prior to her next visit.

## 2016-12-25 ENCOUNTER — Other Ambulatory Visit (INDEPENDENT_AMBULATORY_CARE_PROVIDER_SITE_OTHER): Payer: Self-pay | Admitting: "Endocrinology

## 2016-12-25 DIAGNOSIS — E063 Autoimmune thyroiditis: Secondary | ICD-10-CM

## 2016-12-29 ENCOUNTER — Encounter: Payer: Self-pay | Admitting: *Deleted

## 2017-01-01 ENCOUNTER — Ambulatory Visit: Payer: Medicare Other | Admitting: Anesthesiology

## 2017-01-01 ENCOUNTER — Encounter: Payer: Self-pay | Admitting: *Deleted

## 2017-01-01 ENCOUNTER — Encounter
Admission: RE | Disposition: A | Discharge status: Home / Self Care | Payer: Self-pay | Source: Ambulatory Visit | Attending: Unknown Physician Specialty

## 2017-01-01 ENCOUNTER — Ambulatory Visit
Admission: RE | Admit: 2017-01-01 | Discharge: 2017-01-01 | Disposition: A | Discharge status: Home / Self Care | Payer: Medicare Other | Source: Ambulatory Visit | Attending: Unknown Physician Specialty | Admitting: Unknown Physician Specialty

## 2017-01-01 DIAGNOSIS — Z86010 Personal history of colonic polyps: Secondary | ICD-10-CM | POA: Diagnosis not present

## 2017-01-01 DIAGNOSIS — Z1211 Encounter for screening for malignant neoplasm of colon: Secondary | ICD-10-CM | POA: Insufficient documentation

## 2017-01-01 DIAGNOSIS — G4730 Sleep apnea, unspecified: Secondary | ICD-10-CM | POA: Insufficient documentation

## 2017-01-01 DIAGNOSIS — F17210 Nicotine dependence, cigarettes, uncomplicated: Secondary | ICD-10-CM | POA: Insufficient documentation

## 2017-01-01 DIAGNOSIS — K5730 Diverticulosis of large intestine without perforation or abscess without bleeding: Secondary | ICD-10-CM | POA: Insufficient documentation

## 2017-01-01 DIAGNOSIS — K2210 Ulcer of esophagus without bleeding: Secondary | ICD-10-CM | POA: Insufficient documentation

## 2017-01-01 DIAGNOSIS — K449 Diaphragmatic hernia without obstruction or gangrene: Secondary | ICD-10-CM | POA: Diagnosis not present

## 2017-01-01 DIAGNOSIS — K210 Gastro-esophageal reflux disease with esophagitis: Secondary | ICD-10-CM | POA: Diagnosis present

## 2017-01-01 DIAGNOSIS — Z79899 Other long term (current) drug therapy: Secondary | ICD-10-CM | POA: Insufficient documentation

## 2017-01-01 DIAGNOSIS — I10 Essential (primary) hypertension: Secondary | ICD-10-CM | POA: Insufficient documentation

## 2017-01-01 DIAGNOSIS — R7303 Prediabetes: Secondary | ICD-10-CM | POA: Insufficient documentation

## 2017-01-01 DIAGNOSIS — E039 Hypothyroidism, unspecified: Secondary | ICD-10-CM | POA: Insufficient documentation

## 2017-01-01 DIAGNOSIS — Z87442 Personal history of urinary calculi: Secondary | ICD-10-CM | POA: Diagnosis not present

## 2017-01-01 DIAGNOSIS — K2970 Gastritis, unspecified, without bleeding: Secondary | ICD-10-CM | POA: Diagnosis not present

## 2017-01-01 DIAGNOSIS — K640 First degree hemorrhoids: Secondary | ICD-10-CM | POA: Diagnosis not present

## 2017-01-01 DIAGNOSIS — J449 Chronic obstructive pulmonary disease, unspecified: Secondary | ICD-10-CM | POA: Insufficient documentation

## 2017-01-01 DIAGNOSIS — Z8673 Personal history of transient ischemic attack (TIA), and cerebral infarction without residual deficits: Secondary | ICD-10-CM | POA: Insufficient documentation

## 2017-01-01 HISTORY — PX: ESOPHAGOGASTRODUODENOSCOPY (EGD) WITH PROPOFOL: SHX5813

## 2017-01-01 HISTORY — DX: Prediabetes: R73.03

## 2017-01-01 HISTORY — DX: Sleep apnea, unspecified: G47.30

## 2017-01-01 HISTORY — DX: Calculus of kidney: N20.0

## 2017-01-01 HISTORY — PX: COLONOSCOPY WITH PROPOFOL: SHX5780

## 2017-01-01 HISTORY — DX: Personal history of urinary calculi: Z87.442

## 2017-01-01 SURGERY — COLONOSCOPY WITH PROPOFOL
Anesthesia: General

## 2017-01-01 MED ORDER — SODIUM CHLORIDE 0.9 % IV SOLN
INTRAVENOUS | Status: DC
  Administered 2017-01-01: 1000 mL via INTRAVENOUS

## 2017-01-01 MED ORDER — PROPOFOL 500 MG/50ML IV EMUL
INTRAVENOUS | Status: DC | PRN
  Administered 2017-01-01: 200 ug/kg/min via INTRAVENOUS

## 2017-01-01 MED ORDER — GLYCOPYRROLATE 0.2 MG/ML IJ SOLN
INTRAMUSCULAR | Status: AC
  Filled 2017-01-01: qty 1

## 2017-01-01 MED ORDER — GLYCOPYRROLATE 0.2 MG/ML IJ SOLN
INTRAMUSCULAR | Status: DC | PRN
  Administered 2017-01-01: 0.2 mg via INTRAVENOUS

## 2017-01-01 MED ORDER — LIDOCAINE 2% (20 MG/ML) 5 ML SYRINGE
INTRAMUSCULAR | Status: DC | PRN
  Administered 2017-01-01: 50 mg via INTRAVENOUS

## 2017-01-01 MED ORDER — PROPOFOL 500 MG/50ML IV EMUL
INTRAVENOUS | Status: AC
  Filled 2017-01-01: qty 50

## 2017-01-01 MED ORDER — PHENYLEPHRINE HCL 10 MG/ML IJ SOLN
INTRAMUSCULAR | Status: DC | PRN
  Administered 2017-01-01 (×2): 100 ug via INTRAVENOUS

## 2017-01-01 MED ORDER — SODIUM CHLORIDE 0.9 % IV SOLN: INTRAVENOUS | Status: DC

## 2017-01-01 MED ORDER — PROPOFOL 10 MG/ML IV BOLUS
INTRAVENOUS | Status: DC | PRN
  Administered 2017-01-01: 80 mg via INTRAVENOUS
  Administered 2017-01-01: 20 mg via INTRAVENOUS

## 2017-01-01 MED ORDER — PIPERACILLIN-TAZOBACTAM 3.375 G IVPB
INTRAVENOUS | Status: AC
  Filled 2017-01-01: qty 50

## 2017-01-01 MED ORDER — PIPERACILLIN-TAZOBACTAM 3.375 G IVPB 30 MIN
3.3750 g | Freq: Once | INTRAVENOUS | Status: AC
  Administered 2017-01-01: 3.375 g via INTRAVENOUS
  Filled 2017-01-01: qty 50

## 2017-01-01 NOTE — Anesthesia Preprocedure Evaluation (Signed)
Anesthesia Evaluation  Patient identified by MRN, date of birth, ID band Patient awake    Reviewed: Allergy & Precautions, NPO status , Patient's Chart, lab work & pertinent test results  Airway Mallampati: III  TM Distance: <3 FB     Dental  (+) Teeth Intact   Pulmonary COPD, Current Smoker,     + decreased breath sounds      Cardiovascular Exercise Tolerance: Good hypertension, Pt. on medications  Rhythm:Regular Rate:Normal  Hx of persistent PFO, repaired   Neuro/Psych  Headaches, TIA   GI/Hepatic Neg liver ROS, GERD  Medicated,  Endo/Other  Hypothyroidism   Renal/GU      Musculoskeletal   Abdominal (+) + obese,   Peds negative pediatric ROS (+)  Hematology   Anesthesia Other Findings   Reproductive/Obstetrics                             Anesthesia Physical Anesthesia Plan  ASA: III  Anesthesia Plan: General   Post-op Pain Management:    Induction: Intravenous  PONV Risk Score and Plan: 0  Airway Management Planned: Natural Airway and Nasal Cannula  Additional Equipment:   Intra-op Plan:   Post-operative Plan:   Informed Consent: I have reviewed the patients History and Physical, chart, labs and discussed the procedure including the risks, benefits and alternatives for the proposed anesthesia with the patient or authorized representative who has indicated his/her understanding and acceptance.     Plan Discussed with: CRNA  Anesthesia Plan Comments:         Anesthesia Quick Evaluation

## 2017-01-01 NOTE — Anesthesia Post-op Follow-up Note (Signed)
Anesthesia QCDR form completed.        

## 2017-01-01 NOTE — Transfer of Care (Signed)
Immediate Anesthesia Transfer of Care Note  Patient: [name] [name]  Procedure(s) Performed: COLONOSCOPY WITH PROPOFOL (N/A ) ESOPHAGOGASTRODUODENOSCOPY (EGD) WITH PROPOFOL (N/A )  Patient Location: Endoscopy Unit  Anesthesia Type:General  Level of Consciousness: sedated  Airway & Oxygen Therapy: Patient connected to nasal cannula oxygen  Post-op Assessment: Post -op Vital signs reviewed and stable  Post vital signs: stable  Last Vitals:  Vitals:   01/01/17 0805 01/01/17 0909  BP: (!) 108/52 (!) 92/46  Pulse: 76 79  Resp: 18 14  Temp: (!) 36.1 C (!) 35.6 C  SpO2: 100% 96%    Last Pain:  Vitals:   01/01/17 0909  TempSrc: Tympanic  PainSc:          Complications: No apparent anesthesia complications

## 2017-01-01 NOTE — Op Note (Signed)
[facility] Gastroenterology Patient Name: [name] [name] Procedure Date: 01/01/2017 8:34 AM MRN: [id number] Account #: [id number] Date of Birth: 22-Jan-1961 Admit Type: Outpatient Age: 56 Room: [facility] ENDO ROOM 1 Gender: Female Note Status: Finalized Procedure:            Colonoscopy Indications:          High risk colon cancer surveillance: Personal history                        of colonic polyps Providers:            [name], MD Medicines:            Propofol per Anesthesia Complications:        No immediate complications. Procedure:            Pre-Anesthesia Assessment:                       - After reviewing the risks and benefits, the patient                        was deemed in satisfactory condition to undergo the                        procedure.                       After obtaining informed consent, the colonoscope was                        passed under direct vision. Throughout the procedure,                        the patient's blood pressure, pulse, and oxygen                        saturations were monitored continuously. The Olympus                        PCF-H180AL colonoscope ( S#: [id number] ) was introduced                        through the anus and advanced to the the cecum,                        identified by appendiceal orifice and ileocecal valve.                        The colonoscopy was performed without difficulty. The                        patient tolerated the procedure well. The quality of                        the bowel preparation was excellent. Findings:      Multiple small-mouthed diverticula were found in the sigmoid colon.      Internal hemorrhoids were found during endoscopy. The hemorrhoids were       small and Grade I (internal hemorrhoids that do not prolapse).      The exam was otherwise without abnormality. Impression:           -  Diverticulosis in the sigmoid colon.                       - Internal  hemorrhoids.                       - The examination was otherwise normal.                       - No specimens collected. Recommendation:       - Repeat colonoscopy in 5 years for surveillance. Scot Junobert T Elliott, MD 01/01/2017 8:51:41 AM This report has been signed electronically. Number of Addenda: 0 Note Initiated On: 01/01/2017 8:34 AM Scope Withdrawal Time: 0 hours 5 minutes 44 seconds  Total Procedure Duration: 0 hours 9 minutes 59 seconds       [facility]

## 2017-01-01 NOTE — Op Note (Signed)
[facility] Gastroenterology Patient Name: [name] Baumgartneratricia Loscalzo Procedure Date: 01/01/2017 8:33 AM MRN: [id number] Account #: [id number] Date of Birth: 07/14/1960 Admit Type: Outpatient Age: [age] Room: [facility] ENDO ROOM 1 Gender: Female Note Status: Finalized Procedure:            Upper GI endoscopy Indications:          Dysphagia, Heartburn Providers:            Scot Junobert T. Laure Leone, MD Medicines:            Propofol per Anesthesia Complications:        No immediate complications. Procedure:            Pre-Anesthesia Assessment:                       - After reviewing the risks and benefits, the patient                        was deemed in satisfactory condition to undergo the                        procedure.                       After obtaining informed consent, the endoscope was                        passed under direct vision. Throughout the procedure,                        the patient's blood pressure, pulse, and oxygen                        saturations were monitored continuously. The Endoscope                        was introduced through the mouth, and advanced to the                        second part of duodenum. The upper GI endoscopy was                        accomplished without difficulty. The patient tolerated                        the procedure well. Findings:      LA Grade B (one or more mucosal breaks greater than 5 mm, not extending       between the tops of two mucosal folds) esophagitis with no bleeding was       found 40 cm from the incisors.      A small hiatal hernia was present.      Localized mild inflammation characterized by erythema and granularity       was found in the gastric antrum. Biopsies were taken with a cold forceps       for histology. Biopsies were taken with a cold forceps for Helicobacter       pylori testing.      The examined duodenum was normal. Impression:           - LA Grade B reflux esophagitis.   - Small hiatal hernia.                       -  Gastritis. Biopsied.                       - Normal examined duodenum. Recommendation:       - Await pathology results. [name], MD 01/01/2017 9:07:02 AM This report has been signed electronically. Number of Addenda: 0 Note Initiated On: 01/01/2017 8:33 AM      [facility]

## 2017-01-01 NOTE — Anesthesia Postprocedure Evaluation (Signed)
Anesthesia Post Note  Patient: [name] [name]  Procedure(s) Performed: COLONOSCOPY WITH PROPOFOL (N/A ) ESOPHAGOGASTRODUODENOSCOPY (EGD) WITH PROPOFOL (N/A )  Patient location during evaluation: PACU Anesthesia Type: General Level of consciousness: awake Pain management: pain level controlled Vital Signs Assessment: post-procedure vital signs reviewed and stable Respiratory status: spontaneous breathing Cardiovascular status: stable Anesthetic complications: no     Last Vitals:  Vitals:   01/01/17 0949 01/01/17 0959  BP: (!) 84/47 (!) 94/56  Pulse: 75 60  Resp: 17 18  Temp:    SpO2: 98% 99%    Last Pain:  Vitals:   01/01/17 0909  TempSrc: Tympanic  PainSc:                  VAN STAVEREN,Dakoda Laventure

## 2017-01-01 NOTE — H&P (Signed)
Primary Care Physician:  Arlina RobesWinfield, Albert Carl, MD Primary Gastroenterologist:  Dr. Mechele CollinElliott  Pre-Procedure History & Physical: HPI:  [name] [name] is a 56 y.o. female is here for an endoscopy and colonoscopy.   Past Medical History:  Diagnosis Date  . Allergy to Coumadin    Not an allergy but pt declines Coumadin  . Diverticulitis    August, 2013  . Ejection fraction    EF normal by bedside echo per Dr. Andee LinemaneGent, office visit, July, 2012  . Fatigue   . Galactorrhea syndrome   . GERD (gastroesophageal reflux disease)   . Hemiparesis, right (HCC)   . History of kidney stones   . Hypertension   . Hypothyroidism, acquired, autoimmune   . Kidney stones   . Memory problem   . Migraine headache    Versus TIAs  . Patent foramen ovale    Status post device closure  [facility]  25 mmGore Helix occluder device  . Pre-diabetes   . Sleep apnea   . Stroke [facility](HCC)    Silent right brain stroke/cryptogenic stroke  . Thyroid disease    Hypothyroidism, post-radiation  . Thyroiditis, autoimmune   . Tobacco abuse   . Urinary calculus   . Vertigo   . Visual changes     Past Surgical History:  Procedure Laterality Date  . I-131 TREATMENT  05/25/2006  . PATENT FORAMEN OVALE(PFO) CLOSURE    . TONSILLECTOMY    . TONSILLECTOMY AND ADENOIDECTOMY    . TOTAL ABDOMINAL HYSTERECTOMY  1997   No bso    Prior to Admission medications   Medication Sig Start Date End Date Taking? Authorizing Provider  carvedilol (COREG) 12.5 MG tablet Take 12.5 mg by mouth 2 (two) times daily.     Yes [provider]  clopidogrel (PLAVIX) 75 MG tablet Take 75 mg by mouth daily.   Yes [provider]  levothyroxine (SYNTHROID, LEVOTHROID) 137 MCG tablet Take 1 tablet 5x weekly 12/26/16  Yes David StallBrennan, Michael J, MD  cetirizine (ZYRTEC) 10 MG tablet Take 10 mg by mouth daily.    [provider]  Cholecalciferol 5000 UNITS TABS Take by mouth.    [provider]  fish  oil-omega-3 fatty acids 1000 MG capsule Take 1 g by mouth daily.    [provider]  methocarbamol (ROBAXIN) 500 MG tablet Take 500 mg by mouth every 8 (eight) hours as needed.     [provider]  Multiple Vitamins-Minerals (MULTIVITAMIN ADULTS 50+ PO) Take by mouth.    [provider]  rosuvastatin (CRESTOR) 5 MG tablet Take 5 mg by mouth 3 (three) times a week. Reported on 07/29/2015 03/26/12   David StallBrennan, Michael J, MD    Allergies as of 08/10/2016 - Review Complete 08/01/2016  Allergen Reaction Noted  . Crestor [rosuvastatin calcium] Other (See Comments) 11/17/2010  . Cymbalta [duloxetine hcl] Diarrhea 11/17/2010  . Doxycycline    . Metronidazole      Family History  Problem Relation Age of Onset  . Stroke Other   . Hypertension Other   . Diabetes Mother   . Diabetes Father   . Thyroid disease Maternal Aunt   . Diabetes Maternal Grandmother   . Diabetes Paternal Grandmother   . Cancer Paternal Grandmother     Social History   Social History  . Marital status: Married    Spouse name: N/A  . Number of children: N/A  . Years of education: N/A   Occupational History  . Correctional  Conservator, [employer] [place]   Social History Main Topics  . Smoking status: Current Every Day Smoker    Packs/day: 1.00    Years: 35.00    Types: Cigarettes  . Smokeless tobacco: Never Used  . Alcohol use 0.0 oz/week     Comment: Rarely  . Drug use: No  . Sexual activity: Not on file   Other Topics Concern  . 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 She lived and he handled [place]. Her primary care provider was Dr. Frederica Kuster [address], [address], [place] [address]. Phone numbers was [phone]. Fax was [phone].    Review of Systems: See HPI, otherwise negative ROS  Physical Exam: BP (!) 108/52   Pulse 76   Temp (!) 97 F (36.1 C) (Tympanic)   Resp 18    Ht 5\' 1"  (1.549 m)   Wt 83.5 kg (184 lb)   SpO2 100%   BMI 34.77 kg/m  General:   Alert,  pleasant and cooperative in NAD Head:  Normocephalic and atraumatic. Neck:  Supple; no masses or thyromegaly. Lungs:  Clear throughout to auscultation.    Heart:  Regular rate and rhythm. Abdomen:  Soft, nontender and nondistended. Normal bowel sounds, without guarding, and without rebound.   Neurologic:  Alert and  oriented x4;  grossly normal neurologically.  Impression/Plan: [name] [name] is here for an endoscopy and colonoscopy to be performed for dysphagia and PH colon polyps.  Risks, benefits, limitations, and alternatives regarding  endoscopy and colonoscopy have been reviewed with the patient.  Questions have been answered.  All parties agreeable.   Lynnae Prude, MD  01/01/2017, 8:31 AM

## 2017-01-02 LAB — SURGICAL PATHOLOGY

## 2017-01-03 ENCOUNTER — Encounter: Payer: Self-pay | Admitting: Unknown Physician Specialty

## 2017-01-09 ENCOUNTER — Ambulatory Visit (INDEPENDENT_AMBULATORY_CARE_PROVIDER_SITE_OTHER): Payer: Medicare Other | Admitting: Orthopaedic Surgery

## 2017-02-01 ENCOUNTER — Ambulatory Visit (INDEPENDENT_AMBULATORY_CARE_PROVIDER_SITE_OTHER): Payer: Medicare Other | Admitting: "Endocrinology

## 2017-02-01 ENCOUNTER — Encounter (INDEPENDENT_AMBULATORY_CARE_PROVIDER_SITE_OTHER): Payer: Self-pay | Admitting: "Endocrinology

## 2017-02-01 VITALS — BP 110/70 | HR 80 | Ht 62.25 in | Wt 189.6 lb

## 2017-02-01 DIAGNOSIS — E6609 Other obesity due to excess calories: Secondary | ICD-10-CM

## 2017-02-01 DIAGNOSIS — R600 Localized edema: Secondary | ICD-10-CM

## 2017-02-01 DIAGNOSIS — E063 Autoimmune thyroiditis: Secondary | ICD-10-CM | POA: Diagnosis not present

## 2017-02-01 DIAGNOSIS — R7303 Prediabetes: Secondary | ICD-10-CM | POA: Diagnosis not present

## 2017-02-01 DIAGNOSIS — E049 Nontoxic goiter, unspecified: Secondary | ICD-10-CM | POA: Diagnosis not present

## 2017-02-01 DIAGNOSIS — K2100 Gastro-esophageal reflux disease with esophagitis, without bleeding: Secondary | ICD-10-CM

## 2017-02-01 DIAGNOSIS — Z6836 Body mass index (BMI) 36.0-36.9, adult: Secondary | ICD-10-CM

## 2017-02-01 DIAGNOSIS — Z720 Tobacco use: Secondary | ICD-10-CM

## 2017-02-01 DIAGNOSIS — E673 Hypervitaminosis D: Secondary | ICD-10-CM

## 2017-02-01 DIAGNOSIS — E211 Secondary hyperparathyroidism, not elsewhere classified: Secondary | ICD-10-CM

## 2017-02-01 NOTE — Patient Instructions (Signed)
Follow up visit in 3 months. Please repeat lab tests 1-2 weeks prior to next visit.  

## 2017-02-01 NOTE — Progress Notes (Signed)
CC: Follow-up hypothyroid s/p I-131 treatment, hypertension, goiter, dyspepsia, fatigue, GERD, Hashimoto's disease, status post CVA, partial right hemiparesis, hyperlipidemia, smoking, and supraclavicular fullness    History of present illness: The patient is a 56 year old Caucasian woman.   1. The patient first presented to me on 08/10/04 in referral from her primary care provider, Dr. Frederica Kuster in [place], [place], for evaluation and management of hyperthyroidism. In retrospect, the patient had chronic problems with infected sinuses, inner ear symptoms, and spinning dizziness. She also had complaints of severe fatigue and weight gain. Dr. Jonette Eva had performed thyroid tests on 06/14/04. TSH was 0.13, free T4 1.04. Thyroid ultrasound showed small and large nodules in a thyroid gland that was somewhat enlarged overall. On examination she had a 20+ gram thyroid gland. Thyroid gland was firm, but nontender. She also had some diastolic hypertension. Thyroid tests showed a TSH of 0.437, free T4 1.01, and free T3 of 2.6. Her TPO antibody level was 29 9. Her TSI level was 98. It appeared at that time that the patient likely had some elevation of thyroid hormone due to Hashimoto's disease or had a combination of intermittently active Hashimoto's Disease and intermittently active  Graves' Disease.   2. During the next 2 years, her TSH values varied from 0.350-0.854. By February of 2008, however, her TSH decreased to 0.008, her free T4 increased to 2.08, and her free T3 increased to 5.8. She was clearly hyperthyroid and thyrotoxic. TSI level at that point was 1.8 with normal being 1.3 or less. On 05/25/06 she underwent radioactive iodine treatment with I-131. She subsequently became hypothyroid and was started on Synthroid replacement therapy. Since then her doses of Synthroid have been adjusted several times.   3. During the past 10 years the patient has had several other medical problems:  A. She has had one or more  CVAs/TIAs, which have resulted in some mild right hemiparesis and some transient memory losses. Her MRI of the brain in February 2010 showed evidence for remote infarcts involving the posterior right temporal lobe and parietal lobe.    B. She also had a 3-4 month episode of spontaneous galactorrhea in 2010. Brain MRI showed a 2 x 3 mm area of delayed enhancement seen posteriorly on the left side of her pituitary gland. I felt at the time that it would be very uncharacteristic for a true prolactinoma this small to actually produce hyperprolactinemia and clinical galactorrhea. The galactorrhea subsequently resolved spontaneously.   C. On 08/02/2010 she presented with increased swelling of both supraclavicular areas. A CT scan was performed which showed no significant pathology. She was evaluated by Dr. Christia Reading of [facility]. Dr. Jenne Pane could not identify any cause of the supraclavicular area swelling other than obesity. That swelling has since waxed and waned with her changes in weight.    D. The patient was started on Crestor for hyperlipidemia, but initially could not tolerate even the 5 mg dose more than twice per week. She was subsequently started on Zetia, 10 mg/day, which she tolerated better, but did not control her lipids as well. Crestor was later re-started at a lower dose and she was able to tolerate that medication. However, she later discontinued the Crestor due to symptoms.   E. She has had multiple GI complaints over time, to include severe, chronic diarrhea, heartburn, and abdominal pains. In the Summer of 2015 she saw a gastroenterologist who performed a colonoscopy and removed a polyp. He also put her on a gluten-free and casein-free  diet. Her symptoms improved on that diet, but she has not been consistent with that diet for quite some time.    3. The patient's last [facility] visit was on 11/01/16. After that visit I increased her Synthroid dose to 137 mcg/day for 6 days each week, with no  pill on one day each week.   A. She has been healthy for the most part. Her 65 y.o. mother had a heart attack recently. Her sister has also been sick, so Dennie Bible has been taking care of both of them.   B. She had both an upper and lower endoscopy on 01/01/17: She was diagnosed to have antral gastritis and erosive esophagitis. She was started on omeprazole. She also has diverticuli.    C. She continues to have intermittent diarrhea that varies in frequency and severity over time, but has slowed down some since her last visit. She also has fairly consistent joint and soft tissue aches and pains. She still has one kidney stone, but has not had any further episodes of passing stone fragments. She has not had any further stroke symptoms. Her neurologist continued her on Plavix, 75 mg/day.    D.  She has been eating what she wants and has not often followed her gluten-free and casein-free diet.   E. She does take Tums, Centrum Silver, and calcium 600 mg, and 3,000 IU of vitamin D once daily.   F. She is still smoking.   G. She still has not had her sleep apnea evaluation because she does not want to wear a C-pap device like her mother wore many years ago.   H. Her allergies have been well controled by Zyrtec.   3. Pertinent Review of Systems: Constitutional: She says, "I feel fine." She still snores "like a freight train" and still frequently wakes up choking .  Eyes: Near vision is good with her new glasses. She had an eye exam in March 2018. She was told that she had the beginning stages of macular degeneration. She also had the beginning of cataracts. Her eyes showed signs of high blood pressure. She has a follow up exam scheduled in December.   Neck: She continues to have intermittent problems with swallowing. 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 She has no complaints of anterior neck swelling, soreness, tenderness, pressure, or discomfort. She continues to have episodic  trapezius spasm and pinching of the cervical nerves.   Heart: She occasionally feels faster heart beats which she notes when she is unusually active, especially in the heat, or when she is lying on her left side. 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 Her cardiologist confirmed that diagnosis at her follow up appointment in September or October 2016. The patient has no other complaints of irregular heat beats, chest pain, or chest pressure. Gastrointestinal: She has normal stools more frequently, but watery or mushy stools on some days. Her GERD and heartburn are better when she avoids spicy foods.   Legs: She occasionally has the sensation of something crawling on her skin in the upper right thigh and discomfort of her right knee cap. Muscle mass and strength seem unchanged. Her right side remains somewhat weaker than her left. She still notes some burning of her calves at times. She occasionally has some edema of the lower calves and ankles.  Feet: She sometimes has numbness, burning, and "tickling" in her feet, more  often on the right foot. She notes some edema at times. Her right leg has become stronger over time, so she rarely drags her right foot anymore.  Neuro: She has persistent bilateral numbness and in her thumbs and first two fingers bilaterally.  Mental/psychological: "My brain was doing all right the last time I herd from it. I ain't eating checkers yet." Her abilities to think, to pay attention, to remember, and to make decisions are good.   REVIEW OF SYSTEMS: There are no other significant problems involving her other body systems.  PAST MEDICAL, FAMILY, AND SOCIAL HISTORY:  1. Family: She still lives alone. She has more social interactions now with friends and family. She has two sisters with hypothyroidism. Her daughter is also having thyroid problems.  2. Activities: She has been more physically active. She walk a little more.    3. Tobacco: She is still smoking.   4. Primary care provider: Dr. Donnita Falls, MD in [place].  5. Neurologist: Dr. Lacey Jensen, [facility], phone [phone] 6. Cardiologist: Dr. Marny Lowenstein in [place]   PHYSICAL EXAM: BP: 110/70, HR 80, weight 189 pounds and 19.6 ounces. Her weight has increased 3/4 pounds.  Constitutional: The patient looks healthy and alert today. Her sense of humor is good. Her insight and affect are normal. Her memory is clinically normal. She is quite mentally sharp today.  Face: Her face appears normal.   Eyes: There is no arcus or proptosis.  Mouth: The oropharynx appears normal. The tongue appears normal, but is tobacco stained. There is normal oral moisture. There is no obvious gingivitis. Neck: There are no bruits present. The thyroid gland appears normal in size. The thyroid gland is again normal at approximately 18-20 grams in size. Both lobes are within normal size today. The consistency of the thyroid gland is normal. There is no thyroid tenderness to palpation. Her previous bilateral supraclavicular fullness is not present today.  Lungs: The lungs are clear anteriorly and posteriorly. Air movement is good. Heart: The heart rhythm and rate appear normal. Heart sounds S1 and S2 are normal. I do not appreciate any pathologic heart murmurs. Abdomen: The abdomen is more enlarged. Bowel sounds are normal. The abdomen is soft and non-tender. There is no obviously palpable hepatomegaly, splenomegaly, or other masses.      Arms: Muscle mass appears appropriate for age.  Hands: She has a trace tremor of her right hand today and a trace tremor of her left hand. Phalangeal and metacarpophalangeal joints appear normal. Palms are normal. Legs: Muscle mass appears appropriate for age. There is no edema today.  Neurologic: Right hand grip is weaker than the left handgrip. Right hip flexors are weaker than the left hip flexors. Sensation to touch is intact in her  legs, but a bit less on the left medial calf.Marland Kitchen     LABS:   Labs 02/01/17:   Labs 11/01/16: HbA1c 5.7%, CBG 84; TSH 1.23, free T4 1.5, free T3 2.7; PTH 54 (ref 14-64), calcium 9.1, 25-OH vitamin D 31  Labs 08/01/16: HbA1c 5.5%; TSH 7.10, free T4 1.0. Free T3 2.2; cholesterol 283, triglycerides 101, HDL 83, LDL 180  Labs 07/06/16: CBC normal, except RBC 3.96 (ref 4.04-5.48) and MCH 32.3 (ref 27-31.2), CMP normal  Labs 04/25/16: HbA1c 5.3%, CBG 105  Labs 01/21/16: HbA1c 5.8%; calcium 9.4, PTH 52, 25-OH vitamin D 35; TSH 0.01, free T4 1.8, free T3 3.8; cholesterol 239, triglycerides 65, HDL 75, LDL 151; CMP normal  Labs 0/98/11: TSH 5.01,  free T4 1.45, TPO antibody <1, anti-thyroglobulin antibody <1; CMP normal, with calcium 10.0; CBC normal  Labs 04/27/15: HbA1c 5.5%; TSH 3.61, free T4 1.4, free T3 2.2; cholesterol 258, triglycerides 96, HDL 77, LDL 162; PTH 32, calcium 10.0, 25-OH vitamin d 33  Labs 01/12/15: TSH 0.885, free T4 1.88, free T3 3.01; CBC: Normal except MCV 98; CMP normal; vitamin B12 (344 (normal 239-931), folic acid 19.7 (normal >5.4); PTH 80.49 (normal 12-72), calcium 9.10 (normal 8.5-10.6), 25-OH vitamin D 32.6; cholesterol 231, triglycerides 83, HDL 66, LDL 148  Labs 09/24/14: Ordered, but not performed  Labs 05/21/14: TSH 0.490, free T4 1.46, free T3 2.4; HbA1C 5.8%; calcium 9.3, PTH 52; 25-OH vitamin D 40; cholesterol 246, triglycerides 92, HDL 70, LDL 176; calcitonin <2  Labs 02/09/14: TSH 2.96, free T4 1.43, free T3 2.2  Labs 02/05/14: Urinalysis: large blood, 15 ketones; alkaline phosphatase 108 (normal 32-92);   Labs 02/02/14: Urinalysis: trace blood, no ketones; Hgb 13.6, Hct 43.9, MCV 100.3; CMP normal, with alkaline phosphatase 16 (normal 40-129); TSH 3.03; B12 305 (211-946); cholesterol 282, triglycerides 68, HDL 79, LDL 189; 25-hydroxy vitamin D >60  Labs 07/11/13: B12 236 (normal 211-911), calcitonin < 2 (normal < 6)  Labs 03/10/13: CMP normal. TSH 1.456, free T4  1.36, free T3 2.6; cholesterol 194, triglycerides 82, HDL 72, LDL 106  Labs 11/18/13: TSH 0.539, free T4 1.30, free T3 2.6  Labs 10/16/12: CMP normal; TSH 2.20, free T4 1.10, free T3 2.2; cholesterol 202, triglycerides 77, HDL 82, LDL 105  Labs 06/18/12: TSH 1.260, free T4 1.58, free T3 2.5, cholesterol 211, triglycerides 71, HDL 78, LDL 119  Labs 02/13/12: TSH 0.235, free T4 1.83, cholesterol 236, triglycerides 85, HDL 68, LDL 151. Had been back on Crestor for about 3 weeks, but was off it for about 7 days prior to the labs being drawn.   Labs 01/09/12: AST 45, ALT 70, cholesterol 298, triglycerides 85, HDL 72, LDL 209, TSH 0.44, free T4 1.52, 25-hydroxy,  vitamin D 41. Had been off Crestor for months.   Labs 09/06/11: TSH 0.663, free T4 1.65, T3 117, cholesterol 225, triglycerides 65, HDL 84, LDL 128  Labs 05/09/11: TSH 1.610. Free T4: 1.66. CMP is normal. Cholesterol 214, triglycerides 67, HDL was 73, LDL 128. 25-OH-vitamin D 41.7. She started Zetia as of  05/15/11.  ASSESSMENT: 1. Supraclavicular fullness: The swelling in the supraclavicular fossae has improved over time and is not evident today. It appears that the swelling was just fatty tissue.    2. Obesity: Her weight has increased by 3/4 pounds. Her GI problems and her arthritis make it difficult for her to exercise. Swimming helps when she does it.      3. Hypertension: BP is good today. She still needs daily exercise.  3. Hypothyroid, s/p I-131:   A. She was euthyroid in January 2015.  She was borderline hyperthyroid in March 2016. Her TFTs in November 2016 were essentially normal, although her free T4 was a bit elevated. Her TFTs in May 2017 were low, so I increased her Synthroid dose to 137 mcg/day.   B. In May she was  hypothyroid, so I increased her Synthroid dosage to 137 mcg/day on 5 days per week. Her TFTS in August were normal, but she was actually taking the Synthroid on 6 days per week.   4. Hypercholesterolemia: The patient's  most recent total cholesterol and LDL in May 2018 2017 were higher than in November 2017. She needs to lose weight.  5. Goiter: Thyroid gland is again within normal limits for size today.   6. Thyroiditis: Hashimoto's disease is clinically quiescent today. The non-physiologic shifts in her TFTs at times are c/w intermittent Hashimoto's Dz activity. 7. Dyspepsia/GERD: She was diagnosed with a gastric ulcer in October. She did not have any ulcer symptoms. She is now taking omeprazole. She is not sure if her reflux symptoms have changed.  8. Tobacco: At this point in time she is not motivated to stop smoking. I once again asked her to stop smoking. She again said that she wants to, but it's hard. I again reminded her that she has been able to recover fairly well from her previous stroke(s). She may not be so lucky the next time. If she wants to preserve her brain tissue and her abilities to be active and to live  Independently, she needs to stop smoking.  9. Edema: She does not have any edema today. She sometimes takes in more salt than she needs.  10. 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 When she was on the diet her diarrhea stopped. Her calcitonin was negative, ruling out hypercalcitoninemia as a cause of her diarrhea. She has not been motivated to resume a gluten-free or casein-free diet. She continues to be followed by GI. No obvious lesions were seen during her colonoscopy in October. 11. Excess vitamin D: Her vitamin D levels in 2016, in 2017, and again in 2018 were normal.  12. S/p CVA:  Her MRI on 04/16/08 showed CNS defects in the right cerebrum. Her neurologic defects, however, were on the right side of her body. She may have had more CNS damage in her left cerebrum than had been recognized.  She seems to have recovered fairly well, but still has some right hemiparetic findings. 13. Hyperparathyroidism, secondary to inadequate calcium intake: Her PTH and calcium values in August  2018 were normal, but the calcium was at about the 25% of the normal range. I would like to see it at about the 50%.   14. Snoring: I suspect that she has OSA. I asked her again to obtain a sleep apnea study [place].  15. Nasal congestin and associated symptoms: It appears that her allergies are well controlled by Zyrtec.   16: Prediabetes/Elevated HbA1c: Her HbA1c was back within the prediabetes zone in August 2018. She needs to reduce her carb intake.   PLAN: 1. Diagnostic: Repeat TFTs, C-peptide, calcium, and PTH prior to next visit.   2. Therapeutic: Continue Synthroid dose of 137 mcg/day for 6 days each week for now.  Increase the calcium to 900 mg/day. Continue the vitamin D dose of 3,000 IU/day. Continue the Centrum Silver.  Stop smoking. Take Tums as above. 3. Patient education: We discussed her GI problems, her thyroiditis and hypothyroidism, and her neuro issues. I reviewed the Eat  Right Diet with her and discussed the [facility] Diet recipes. I asked her again to stop smoking.  4. Follow-up: Follow up visit in 3 months.  Level of Service: This visit lasted in excess of 50 minutes. More than 50% of the visit was devoted to counseling.  Molli KnockMichael Brennan, MD, CDE Adult and Pediatric Endocrinology

## 2017-04-19 ENCOUNTER — Other Ambulatory Visit (INDEPENDENT_AMBULATORY_CARE_PROVIDER_SITE_OTHER): Payer: Self-pay | Admitting: "Endocrinology

## 2017-04-22 LAB — TSH+FREE T4
Free T4: 1.61 ng/dL (ref 0.82–1.77)
TSH: 0.453 u[IU]/mL (ref 0.450–4.500)

## 2017-04-22 LAB — T3, FREE: T3, Free: 2.7 pg/mL (ref 2.0–4.4)

## 2017-04-22 LAB — C-PEPTIDE: C-Peptide: 3.3 ng/mL (ref 1.1–4.4)

## 2017-04-22 LAB — INTACT PTH (INCLUDES CALCIUM)
Calcium, Serum: 9.8 mg/dL
PTH (Intact Assay): 47 pg/mL

## 2017-05-03 ENCOUNTER — Ambulatory Visit (INDEPENDENT_AMBULATORY_CARE_PROVIDER_SITE_OTHER): Payer: Medicare Other | Admitting: "Endocrinology

## 2017-05-08 ENCOUNTER — Ambulatory Visit (INDEPENDENT_AMBULATORY_CARE_PROVIDER_SITE_OTHER): Payer: Medicare Other | Admitting: "Endocrinology

## 2017-05-08 ENCOUNTER — Encounter (INDEPENDENT_AMBULATORY_CARE_PROVIDER_SITE_OTHER): Payer: Self-pay | Admitting: "Endocrinology

## 2017-05-08 VITALS — BP 120/72 | HR 86 | Ht 61.61 in | Wt 192.4 lb

## 2017-05-08 DIAGNOSIS — R7303 Prediabetes: Secondary | ICD-10-CM | POA: Diagnosis not present

## 2017-05-08 DIAGNOSIS — E211 Secondary hyperparathyroidism, not elsewhere classified: Secondary | ICD-10-CM | POA: Diagnosis not present

## 2017-05-08 DIAGNOSIS — R1013 Epigastric pain: Secondary | ICD-10-CM | POA: Diagnosis not present

## 2017-05-08 DIAGNOSIS — E063 Autoimmune thyroiditis: Secondary | ICD-10-CM

## 2017-05-08 DIAGNOSIS — E049 Nontoxic goiter, unspecified: Secondary | ICD-10-CM

## 2017-05-08 DIAGNOSIS — E6609 Other obesity due to excess calories: Secondary | ICD-10-CM

## 2017-05-08 DIAGNOSIS — Z720 Tobacco use: Secondary | ICD-10-CM

## 2017-05-08 DIAGNOSIS — R600 Localized edema: Secondary | ICD-10-CM | POA: Diagnosis not present

## 2017-05-08 DIAGNOSIS — K219 Gastro-esophageal reflux disease without esophagitis: Secondary | ICD-10-CM

## 2017-05-08 LAB — POCT GLYCOSYLATED HEMOGLOBIN (HGB A1C): Hemoglobin A1C: 5.6

## 2017-05-08 LAB — POCT GLUCOSE (DEVICE FOR HOME USE): POC Glucose: 88 mg/dl (ref 70–99)

## 2017-05-08 MED ORDER — LEVOTHYROXINE SODIUM 112 MCG PO TABS: 112.0000 ug | ORAL_TABLET | Freq: Every day | ORAL | 0 refills | Status: DC

## 2017-05-08 NOTE — Patient Instructions (Addendum)
Follow up visit in 3 months. Please change Synthroid to one 112 mcg tablet daily. Please repeat thyroid tests one week prior.

## 2017-05-08 NOTE — Progress Notes (Signed)
CC: Follow-up hypothyroid s/p I-131 treatment, hypertension, goiter, dyspepsia, fatigue, GERD, Hashimoto's disease, status post CVA, partial right hemiparesis, hyperlipidemia, smoking, and supraclavicular fullness    History of present illness: The patient is a 57 year old Caucasian woman.   1. The patient first presented to me on 08/10/04 in referral from her primary care provider, Dr. Frederica Kuster in [place], [place], for evaluation and management of hyperthyroidism. In retrospect, the patient had chronic problems with infected sinuses, inner ear symptoms, and spinning dizziness. She also had complaints of severe fatigue and weight gain. Dr. Jonette Eva had performed thyroid tests on 06/14/04. TSH was 0.13, free T4 1.04. Thyroid ultrasound showed small and large nodules in a thyroid gland that was somewhat enlarged overall. On examination she had a 20+ gram thyroid gland. Thyroid gland was firm, but nontender. She also had some diastolic hypertension. Thyroid tests showed a TSH of 0.437, free T4 1.01, and free T3 of 2.6. Her TPO antibody level was 29 9. Her TSI level was 98. It appeared at that time that the patient likely had some elevation of thyroid hormone due to Hashimoto's disease or had a combination of intermittently active Hashimoto's Disease and intermittently active  Graves' Disease.   2. During the next 2 years, her TSH values varied from 0.350-0.854. By February of 2008, however, her TSH decreased to 0.008, her free T4 increased to 2.08, and her free T3 increased to 5.8. She was clearly hyperthyroid and thyrotoxic. TSI level at that point was 1.8 with normal being 1.3 or less. On 05/25/06 she underwent radioactive iodine treatment with I-131. She subsequently became hypothyroid and was started on Synthroid replacement therapy. Since then her doses of Synthroid have been adjusted several times.   3. During the past 11 years the patient has had several other medical problems:  A. She has had one or more  CVAs/TIAs, which have resulted in some mild right hemiparesis and some transient memory losses. Her MRI of the brain in February 2010 showed evidence for remote infarcts involving the posterior right temporal lobe and parietal lobe.    B. She also had a 3-4 month episode of spontaneous galactorrhea in 2010. Brain MRI showed a 2 x 3 mm area of delayed enhancement seen posteriorly on the left side of her pituitary gland. I felt at the time that it would be very uncharacteristic for a true prolactinoma this small to actually produce hyperprolactinemia and clinical galactorrhea. The galactorrhea subsequently resolved spontaneously.   C. On 08/02/2010 she presented with increased prominence of both supraclavicular areas. A CT scan was performed which showed no significant pathology. She was evaluated by Dr. Christia Reading of [facility]. Dr. Jenne Pane could not identify any cause of the supraclavicular area prominence other than obesity. Since then, that prominence has waxed and waned with her changes in weight.    D. The patient was started on Crestor for hyperlipidemia, but initially could not tolerate even the 5 mg dose more than twice per week. She was subsequently started on Zetia, 10 mg/day, which she tolerated better, but did not control her lipids as well. Crestor was later re-started at a lower dose and she was able to tolerate that medication. However, she later discontinued the Crestor due to symptoms.   E. She has had multiple GI complaints over time, to include severe, chronic diarrhea, heartburn, and abdominal pains. In the Summer of 2015 she saw a gastroenterologist who performed a colonoscopy and removed a polyp. He also put her on a gluten-free and  casein-free diet. Her symptoms improved on that diet, but she has not been consistent with that diet for quite some time.    3. The patient's last [facility] visit was on 02/01/17. After that visit I continued her Synthroid dose of 137 mcg/day for 6 days each  week, with no pill on one day each week. I also increased her calcium to 900 mg/day.   A. She has been healthy for the most part.  B. She had both an upper and lower endoscopy on 01/01/17: She was diagnosed to have antral gastritis and erosive esophagitis. She was started on omeprazole. She also has diverticuli.    C. She continues to have intermittent diarrhea that varies in frequency and severity over time, but the diarrhea has decreased in frequency and severity since her last visit. She has stopped eating out at restaurants and is trying to eat organic. She is also doing somewhat better at following her gluten-free diet. She has not had any diarrhea this week.   D. She also has fairly consistent joint and soft tissue aches and pains. She still has one kidney stone, but has not had any further episodes of passing stone fragments. She has not had any further stroke symptoms. Her neurologist continued her on Plavix, 75 mg/day.    E. She does take Tums, Centrum Silver, calcium 900 mg, and 3,000 IU of vitamin D once daily.   F. She is still smoking.   G. She still has not had her sleep apnea evaluation because she does not want to wear a C-pap device like her mother wore many years ago.   H. Her allergies have been well controled by Zyrtec.   3. Pertinent Review of Systems: Constitutional: She says, "I feel pretty good." She still snores "like a freight train" and still frequently wakes up choking .  Eyes: Near vision is good with her new glasses. She had an eye exam in December 2018. She was told that she had the beginning stages of macular degeneration. She also had the beginning of cataracts. Her eyes showed signs of high blood pressure.  Neck: She continues to have intermittent problems with swallowing. 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 She has no complaints of anterior neck swelling, soreness, tenderness, pressure, or discomfort. She continues to have  episodic trapezius spasm and pinching of the cervical nerves.   Heart: She occasionally feels a fast heart rate when she lies on her left side at night or when she is unusually active, especially in the heat. 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 Her cardiologist confirmed that diagnosis at her follow up appointment in September or October 2016. The patient has no other complaints of irregular heat beats, chest pain, or chest pressure. Gastrointestinal: She has normal stools more frequently, but watery or mushy stools on some days. Her GERD and heartburn are better when she avoids spicy foods and takes her omeprazole.   Legs: She occasionally has the sensation of something crawling on her skin in the upper right thigh and discomfort of her right patella. Muscle mass and strength seem unchanged. Her right side remains somewhat weaker than her left. She still notes some burning of her calves at times. She occasionally has some edema of the lower calves and ankles.  Feet: She sometimes has numbness, burning, and "tickling" in her feet, more often on the right foot. She notes  some edema at times. Her right leg has become stronger over time, so she rarely drags her right foot anymore.  Neuro: She has persistent bilateral numbness in her thumbs and first two fingers bilaterally.  Mental/psychological: Her abilities to think, to pay attention, to remember, and to make decisions are good. "I ain't done nothin stupid lately."  REVIEW OF SYSTEMS: There are no other significant problems involving her other body systems.  PAST MEDICAL, FAMILY, AND SOCIAL HISTORY:  1. Family: She still lives alone. She has frequent social interactions with friends and family. She has two sisters with hypothyroidism. Her daughter is also having thyroid problems. Her mother and sister are doing better. 2. Activities: She has not been very physically active, but she often walks.   3. Tobacco: She is still smoking.   4. Primary care provider: Dr. Donnita Falls, MD in [place].  5. Neurologist: Dr. Lacey Jensen, [facility], phone [phone] 6. Cardiologist: Dr. Marny Lowenstein in [place]   PHYSICAL EXAM: BP: 120/72, HR 86, weight 192 pounds and 6.4 ounces. Her weight has increased 2-1/2 pounds.  Constitutional: The patient looks healthy and alert today. Her sense of humor is good. Her insight and affect are normal. Her memory is clinically normal. She is quite mentally sharp today.  Face: Her face appears normal.   Eyes: There is no arcus or proptosis.  Mouth: The oropharynx appears normal. The tongue appears normal, but is tobacco stained. There is normal oral moisture. There is no obvious gingivitis. Neck: There are no bruits present. The thyroid gland appears normal in size. The thyroid gland is again normal at approximately 18-20 grams in size. Both lobes are within normal size today. The consistency of the thyroid gland is normal. There is no thyroid tenderness to palpation. Her previous bilateral supraclavicular fullness is not present today.  Lungs: The lungs are clear anteriorly and posteriorly. Air movement is good. Heart: The heart rhythm and rate appear normal. Heart sounds S1 and S2 are normal. I do not appreciate any pathologic heart murmurs. Abdomen: The abdomen is more enlarged. Bowel sounds are normal. The abdomen is soft and non-tender. There is no obviously palpable hepatomegaly, splenomegaly, or other masses.      Arms: Muscle mass appears appropriate for age.  Hands: She has no tremor of her hands today. Phalangeal and metacarpophalangeal joints appear normal. Palms are normal. Legs: Muscle mass appears appropriate for age. There is trace edema bilaterally today.  Neurologic: Right hand grip is again weaker than the left handgrip, abut essentially unchanged. Right hip flexors are again weaker than the left hip flexors, but essentially  unchanged. Sensation to touch is intact in her legs today.      LABS:   Labs 05/08/17: HbA1c 5.6%, CBG 88  Labs 04/19/17: TSH 0.453, free T4 1.61, free T3 2.7; Calcium 9.8, PTH 47; C-peptide 3.3  Labs 11/01/16: HbA1c 5.7%, CBG 84; TSH 1.23, free T4 1.5, free T3 2.7; PTH 54 (ref 14-64), calcium 9.1, 25-OH vitamin D 31  Labs 08/01/16: HbA1c 5.5%; TSH 7.10, free T4 1.0. Free T3 2.2; cholesterol 283, triglycerides 101, HDL 83, LDL 180  Labs 07/06/16: CBC normal, except RBC 3.96 (ref 4.04-5.48) and MCH 32.3 (ref 27-31.2), CMP normal  Labs 04/25/16: HbA1c 5.3%, CBG 105  Labs 01/21/16: HbA1c 5.8%; calcium 9.4, PTH 52, 25-OH vitamin D 35; TSH 0.01, free T4 1.8, free T3 3.8; cholesterol 239, triglycerides 65, HDL 75, LDL 151; CMP normal  Labs 7/84/69: TSH 5.01, free T4 1.45, TPO antibody <  1, anti-thyroglobulin antibody <1; CMP normal, with calcium 10.0; CBC normal  Labs 04/27/15: HbA1c 5.5%; TSH 3.61, free T4 1.4, free T3 2.2; cholesterol 258, triglycerides 96, HDL 77, LDL 162; PTH 32, calcium 10.0, 25-OH vitamin d 33  Labs 01/12/15: TSH 0.885, free T4 1.88, free T3 3.01; CBC: Normal except MCV 98; CMP normal; vitamin B12 (344 (normal 239-931), folic acid 19.7 (normal >5.4); PTH 80.49 (normal 12-72), calcium 9.10 (normal 8.5-10.6), 25-OH vitamin D 32.6; cholesterol 231, triglycerides 83, HDL 66, LDL 148  Labs 09/24/14: Ordered, but not performed  Labs 05/21/14: TSH 0.490, free T4 1.46, free T3 2.4; HbA1C 5.8%; calcium 9.3, PTH 52; 25-OH vitamin D 40; cholesterol 246, triglycerides 92, HDL 70, LDL 176; calcitonin <2  Labs 02/09/14: TSH 2.96, free T4 1.43, free T3 2.2  Labs 02/05/14: Urinalysis: large blood, 15 ketones; alkaline phosphatase 108 (normal 32-92);   Labs 02/02/14: Urinalysis: trace blood, no ketones; Hgb 13.6, Hct 43.9, MCV 100.3; CMP normal, with alkaline phosphatase 16 (normal 40-129); TSH 3.03; B12 305 (211-946); cholesterol 282, triglycerides 68, HDL 79, LDL 189; 25-hydroxy vitamin D  >60  Labs 07/11/13: B12 236 (normal 211-911), calcitonin < 2 (normal < 6)  Labs 03/10/13: CMP normal. TSH 1.456, free T4 1.36, free T3 2.6; cholesterol 194, triglycerides 82, HDL 72, LDL 106  Labs 11/18/13: TSH 0.539, free T4 1.30, free T3 2.6  Labs 10/16/12: CMP normal; TSH 2.20, free T4 1.10, free T3 2.2; cholesterol 202, triglycerides 77, HDL 82, LDL 105  Labs 06/18/12: TSH 1.260, free T4 1.58, free T3 2.5, cholesterol 211, triglycerides 71, HDL 78, LDL 119  Labs 02/13/12: TSH 0.235, free T4 1.83, cholesterol 236, triglycerides 85, HDL 68, LDL 151. Had been back on Crestor for about 3 weeks, but was off it for about 7 days prior to the labs being drawn.   Labs 01/09/12: AST 45, ALT 70, cholesterol 298, triglycerides 85, HDL 72, LDL 209, TSH 0.44, free T4 1.52, 25-hydroxy,  vitamin D 41. Had been off Crestor for months.   Labs 09/06/11: TSH 0.663, free T4 1.65, T3 117, cholesterol 225, triglycerides 65, HDL 84, LDL 128  Labs 05/09/11: TSH 6.962. Free T4: 1.66. CMP is normal. Cholesterol 214, triglycerides 67, HDL was 73, LDL 128. 25-OH-vitamin D 41.7. She started Zetia as of  05/15/11.  ASSESSMENT: 1. Supraclavicular fullness: The prominence in the supraclavicular fossae has improved over time and is not evident today. It appears that the prominence was just fatty tissue.    2. Obesity: Her weight has increased by 2.5 pounds. Her GI problems and her arthritis make it difficult for her to exercise. Swimming helps when she does it.      3. Hypertension: BP is good today. She still needs daily exercise.  3. Hypothyroid, s/p I-131:   A. Her TFTS in August 2018 were normal on her Synthroid dose of 137 mcg/day for 6 days each week.    C. In February 2019 she was mildly hyperthyroid on that same dose. She needs less Synthroid...   4. Hypercholesterolemia: The patient's most recent total cholesterol and LDL in May 2018 2017 were higher than in November 2017. She needs to lose weight.  5. Goiter: Thyroid  gland is again within normal limits for size today.   6. Thyroiditis: Hashimoto's disease is clinically quiescent today. The non-physiologic shifts in her TFTs at times are c/w intermittent Hashimoto's Dz activity. 7. Dyspepsia/GERD: She was diagnosed with a gastric ulcer in October 2018. She did not have any  ulcer symptoms. She is now taking omeprazole. She feels that her reflux symptoms have improved  When she takes her medication and avoids spicy foods..   8. Tobacco: At this point in time she is not motivated to stop smoking. I again asked her to stop smoking. She again said that she wants to, but it's hard. I again reminded her that she has been able to recover fairly well from her previous stroke(s). She may not be so lucky the next time. If she wants to preserve her brain tissue and her abilities to be active and to live  Independently, she needs to stop smoking.  9. Edema: She has a trace of edema today, probably from her Diet Doctor Pepper. She sometimes takes in more salt than she needs.  10. 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 When she was on the diet her diarrhea stopped. When she went off the diet her diarrhea worsened. Her calcitonin was negative, ruling out hypercalcitoninemia as a cause of her diarrhea. Fortunately she is doing better now. She continues to be followed by GI. No obvious lesions were seen during her colonoscopy in October. 11. Excess vitamin D: Her vitamin D levels in 2016, in 2017, and again in 2018 were normal.  12. S/p CVA:  Her MRI on 04/16/08 showed CNS defects in the right cerebrum. Her neurologic defects, however, were on the right side of her body. She may have had more CNS damage in her left cerebrum than had been recognized.  She seems to have recovered fairly well, but still has some right hemiparetic findings. 13. Hyperparathyroidism, secondary to inadequate calcium intake: Her PTH and calcium values in August 2018 were normal, but the calcium was  at about the 25% of the normal range. Her calcium and PTH values in February 2019 were good.    14. Snoring: I suspect that she has OSA. I asked her again to obtain a sleep apnea study up in [place]. She again politely refused. 15. Nasal congestion and associated symptoms: It appears that her allergies are well controlled by Zyrtec.   16: Prediabetes/Elevated HbA1c: Her HbA1c was up into the prediabetes zone in August 2018, but has decreased back into the normal range since then.  PLAN: 1. Diagnostic: Repeat TFTs prior to next visit.   2. Therapeutic: Change Synthroid to 112 mcg/day. Continue calcium dose of 900 mg/day. Continue the vitamin D dose of 3,000 IU/day. Continue the Centrum Silver.  Stop smoking. Take Tums as above. 3. Patient education: We discussed her GI problems, her thyroiditis and hypothyroidism, and her neuro issues. I reviewed the Eat Right Diet with her and discussed the [facility] Diet recipes. I asked her again to stop smoking.  4. Follow-up: Follow up visit in 3 months.  Level of Service: This visit lasted in excess of 45 minutes. More than 50% of the visit was devoted to counseling.  Molli Knock, MD, CDE Adult and Pediatric Endocrinology

## 2017-07-13 ENCOUNTER — Other Ambulatory Visit (INDEPENDENT_AMBULATORY_CARE_PROVIDER_SITE_OTHER): Payer: Self-pay | Admitting: "Endocrinology

## 2017-08-02 ENCOUNTER — Other Ambulatory Visit (INDEPENDENT_AMBULATORY_CARE_PROVIDER_SITE_OTHER): Payer: Self-pay | Admitting: "Endocrinology

## 2017-08-03 LAB — T3, FREE: T3, Free: 2.3 pg/mL (ref 2.0–4.4)

## 2017-08-03 LAB — TSH: TSH: 1.17 u[IU]/mL (ref 0.450–4.500)

## 2017-08-03 LAB — T4, FREE: FREE T4: 1.28 ng/dL (ref 0.82–1.77)

## 2017-08-08 ENCOUNTER — Ambulatory Visit (INDEPENDENT_AMBULATORY_CARE_PROVIDER_SITE_OTHER): Payer: Medicare Other | Admitting: "Endocrinology

## 2017-08-08 ENCOUNTER — Encounter (INDEPENDENT_AMBULATORY_CARE_PROVIDER_SITE_OTHER): Payer: Self-pay | Admitting: "Endocrinology

## 2017-08-08 VITALS — BP 118/70 | HR 80 | Wt 192.4 lb

## 2017-08-08 DIAGNOSIS — M722 Plantar fascial fibromatosis: Secondary | ICD-10-CM | POA: Diagnosis not present

## 2017-08-08 DIAGNOSIS — R600 Localized edema: Secondary | ICD-10-CM | POA: Diagnosis not present

## 2017-08-08 DIAGNOSIS — M19071 Primary osteoarthritis, right ankle and foot: Secondary | ICD-10-CM

## 2017-08-08 DIAGNOSIS — E890 Postprocedural hypothyroidism: Secondary | ICD-10-CM

## 2017-08-08 DIAGNOSIS — E7800 Pure hypercholesterolemia, unspecified: Secondary | ICD-10-CM | POA: Diagnosis not present

## 2017-08-08 DIAGNOSIS — E063 Autoimmune thyroiditis: Secondary | ICD-10-CM | POA: Diagnosis not present

## 2017-08-08 DIAGNOSIS — R7303 Prediabetes: Secondary | ICD-10-CM | POA: Diagnosis not present

## 2017-08-08 DIAGNOSIS — E049 Nontoxic goiter, unspecified: Secondary | ICD-10-CM | POA: Diagnosis not present

## 2017-08-08 DIAGNOSIS — I10 Essential (primary) hypertension: Secondary | ICD-10-CM

## 2017-08-08 DIAGNOSIS — R1013 Epigastric pain: Secondary | ICD-10-CM | POA: Diagnosis not present

## 2017-08-08 LAB — POCT GLYCOSYLATED HEMOGLOBIN (HGB A1C): HEMOGLOBIN A1C: 5.4 % (ref 4.0–5.6)

## 2017-08-08 LAB — POCT GLUCOSE (DEVICE FOR HOME USE): POC GLUCOSE: 94 mg/dL (ref 70–99)

## 2017-08-08 NOTE — Progress Notes (Signed)
CC: Follow-up hypothyroid s/p I-131 treatment, hypertension, goiter, dyspepsia, fatigue, GERD, Hashimoto's disease, status post CVA, partial right hemiparesis, hyperlipidemia, smoking, prediabetes, and supraclavicular fullness    History of present illness: [name] [name] is a 57 year old Caucasian woman.   1. [name] [name] first presented to me on 08/10/04 in referral from her primary care provider, Dr. Frederica Kuster in [place], [place], for evaluation and management of hyperthyroidism. In retrospect, the patient had chronic problems with infected sinuses, inner ear symptoms, and spinning dizziness. She also had complaints of severe fatigue and weight gain. Dr. Jonette Eva had performed thyroid tests on 06/14/04. TSH was 0.13, free T4 1.04. Thyroid ultrasound showed small and large nodules in a thyroid gland that was somewhat enlarged overall. On examination she had a 20+ gram thyroid gland. Thyroid gland was firm, but nontender. She also had some diastolic hypertension. Thyroid tests showed a TSH of 0.437, free T4 1.01, and free T3 of 2.6. Her TPO antibody level was 29 9. Her TSI level was 98. It appeared at that time that the patient likely had some elevation of thyroid hormone due to Hashimoto's disease or had a combination of intermittently active Hashimoto's Disease and intermittently active  Graves' Disease.   2. During the next 2 years, her TSH values varied from 0.350-0.854. By February of 2008, however, her TSH decreased to 0.008, her free T4 increased to 2.08, and her free T3 increased to 5.8. She was clearly hyperthyroid and thyrotoxic. TSI level at that point was 1.8 with normal being 1.3 or less. On 05/25/06 she underwent radioactive iodine treatment with I-131. She subsequently became hypothyroid and was started on Synthroid replacement therapy. Since then her doses of Synthroid have been adjusted several times.   3. During the past 11 years the patient has had several other medical problems:  A. She has had  one or more CVAs/TIAs, which have resulted in some mild right hemiparesis and some transient memory losses. Her MRI of the brain in February 2010 showed evidence for remote infarcts involving the posterior right temporal lobe and parietal lobe.    B. She also had a 3-4 month episode of spontaneous galactorrhea in 2010. Brain MRI showed a 2 x 3 mm area of delayed enhancement seen posteriorly on the left side of her pituitary gland. I felt at the time that it would be very uncharacteristic for a true prolactinoma this small to actually produce hyperprolactinemia and clinical galactorrhea. The galactorrhea subsequently resolved spontaneously.   C. On 08/02/2010 she presented with increased prominence of both supraclavicular areas. A CT scan was performed which showed no significant pathology. She was evaluated by Dr. Christia Reading of [facility]. Dr. Jenne Pane could not identify any cause of the supraclavicular area prominence other than obesity. Since then, that prominence has waxed and waned with her changes in weight.    D. The patient was started on Crestor for hyperlipidemia, but initially could not tolerate even the 5 mg dose more than twice per week. She was subsequently started on Zetia, 10 mg/day, which she tolerated better, but did not control her lipids as well. Crestor was later re-started at a lower dose and she was able to tolerate that medication. However, she later discontinued the Crestor due to symptoms.   E. She has had multiple GI complaints over time, to include severe, chronic diarrhea, heartburn, and abdominal pains. In the Summer of 2015 she saw a gastroenterologist who performed a colonoscopy and removed a polyp. He also put her on a gluten-free  and casein-free diet. Her symptoms improved on that diet, but she has not been consistent with that diet for quite some time.    3. The patient's last [facility] visit was on 05/08/17. After that visit I continued her Synthroid dose of 112 mcg/day.   A.  She has been healthy for the most part.  B. About one month ago she developed swelling in her lower right calf, ankle, and foot. When she is active she has a great deal of pain. She says she has not been taking in too much salt, but she has been eating out at restaurants a lot recently.   C. She had both an upper and lower endoscopy on 01/01/17: She was diagnosed to have antral gastritis and erosive esophagitis. She was started on omeprazole, which she only takes as needed. She also has diverticuli.    D. She continues to have intermittent diarrhea that varies in frequency and severity over time, but the diarrhea has decreased in frequency and severity since her last visit. She is also doing somewhat better at following her gluten-free diet, but still uses organic flour that is not gluten-free.    E. She also has fairly consistent joint and soft tissue aches and pains. She still has one kidney stone, but has not had any further episodes of passing stone fragments. She has not had any further stroke symptoms. Her neurologist continued her on Plavix, 75 mg/day.    F. She does take Tums, Centrum Silver, calcium 900 mg, and 3,000 IU of vitamin D once daily.   G. She is still smoking.   H. She still has not had her sleep apnea evaluation because she does not want to wear a C-pap device like her mother wore many years ago.   I. Her allergies have been "going wild" this year.    J. Her mother died Sunday nigh in [place]. Dennie Bibleat is emotionally "numb".   3. Pertinent Review of Systems: Constitutional: She says, "I feel pretty good." She still snores "like a freight train" and still frequently wakes up choking. Eyes: Near vision is good with her new glasses. She had an eye exam in December 2018. She was told that she had the beginning stages of macular degeneration. She also had the beginning of cataracts. Her eyes showed signs of high blood pressure. She missed her follow up exam this past Monday  Neck: She  continues to have intermittent problems with swallowing. 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 She has no complaints of anterior neck swelling, soreness, tenderness, pressure, or discomfort. She continues to have episodic trapezius spasm and pinching of the cervical nerves.   Heart: She occasionally feels a fast heart rate when she lies on her left side at night or when she is unusually active, especially in the heat. 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 Her cardiologist confirmed that diagnosis at her follow up appointment in September or October 2016. The patient has no other complaints of irregular heat beats, chest pain, or chest pressure. Gastrointestinal: She has normal stools most frequently, but watery or mushy stools on some days. Her GERD and heartburn are better when she avoids spicy foods and takes her omeprazole.   Legs: As above. She still occasionally has the sensation of something crawling on her skin in the upper right thigh and discomfort of her right patella. Muscle mass and strength seem  unchanged. Her right side remains somewhat weaker than her left. She still notes some burning of her calves at times. Feet: She sometimes has numbness, burning, and "tickling" in her feet, more often on the right foot. She notes some edema at times. Her right leg has become stronger over time, so she rarely drags her right foot anymore.  Neuro: She has persistent bilateral numbness in her thumbs and first two fingers bilaterally.  Mental/psychological: Her abilities to think, to pay attention, to remember, and to make decisions are good. "I ain't eating checkers."  REVIEW OF SYSTEMS: There are no other significant problems involving her other body systems.  PAST MEDICAL, FAMILY, AND SOCIAL HISTORY:  1. Family: She still lives alone. She has frequent social interactions with friends and family. She has two  sisters with hypothyroidism. Her daughter is also having thyroid problems. 2. Activities: She has not been very physically active, but she sometimes walks.  3. Tobacco: She is still smoking.   4. Primary care provider: Dr. Donnita Falls, MD in [place].  5. Neurologist: Dr. Lacey Jensen, [facility], phone [phone] 6. Cardiologist: Dr. Marny Lowenstein in [place]   PHYSICAL EXAM: BP: 118/70, HR 80, weight 192 pounds and 6.4 ounces. Her weight has remained the same.   Constitutional: The patient looks healthy and alert today. Her sense of humor is good. Her insight and affect are normal. Her memory is clinically normal. She is quite mentally sharp today.  Face: Her face appears normal.   Eyes: There is no arcus or proptosis.  Mouth: The oropharynx appears normal. The tongue appears normal, but is tobacco stained. There is normal oral moisture. There is no obvious gingivitis. Neck: There are no bruits present. The thyroid gland appears normal in size. The thyroid gland is again normal at approximately 18-20 grams in size. Both lobes are within normal size today. The consistency of the thyroid gland is normal. There is no thyroid tenderness to palpation. Her previous bilateral supraclavicular fullness is not present today.  Lungs: The lungs are clear anteriorly and posteriorly. Air movement is good. Heart: The heart rhythm and rate appear normal. Heart sounds S1 and S2 are normal. I do not appreciate any pathologic heart murmurs. Abdomen: The abdomen is enlarged. Bowel sounds are normal. The abdomen is soft and non-tender. There is no obviously palpable hepatomegaly, splenomegaly, or other masses.      Arms: Muscle mass appears appropriate for age.  Hands: She has no tremor of her hands today. Phalangeal and metacarpophalangeal joints appear normal. Palms are normal. Legs: Muscle mass appears appropriate for age. There is 1+ edema on the right and trace-to-1+ edema on the left. today.   Feet: She has a faint 1+ right DP pulse and a 1+ left DP pulse. She has 1+ swelling of her right ankle and trace swelling of her left ankle.  The ankle is not hot or sore to passive or active range of motion. The right plantar fascia is somewhat tender to palpation in the mid-foot.  Neurologic: Right hand grip is again weaker than the left handgrip, but essentially unchanged. Right hip flexors are again weaker than the left hip flexors, but essentially unchanged. Sensation to touch is intact in her legs today.      LABS:   Labs 08/08/17: HbA1c 5.4%, CBG 94  Labs 08/02/17: TSH 1.17, free T4 1.28, free T3 2.3  Labs 05/08/17: HbA1c 5.6%, CBG 88  Labs 04/19/17: TSH 0.453, free T4 1.61, free T3 2.7; Calcium 9.8, PTH  47; C-peptide 3.3  Labs 11/01/16: HbA1c 5.7%, CBG 84; TSH 1.23, free T4 1.5, free T3 2.7; PTH 54 (ref 14-64), calcium 9.1, 25-OH vitamin D 31  Labs 08/01/16: HbA1c 5.5%; TSH 7.10, free T4 1.0. Free T3 2.2; cholesterol 283, triglycerides 101, HDL 83, LDL 180  Labs 07/06/16: CBC normal, except RBC 3.96 (ref 4.04-5.48) and MCH 32.3 (ref 27-31.2), CMP normal  Labs 04/25/16: HbA1c 5.3%, CBG 105  Labs 01/21/16: HbA1c 5.8%; calcium 9.4, PTH 52, 25-OH vitamin D 35; TSH 0.01, free T4 1.8, free T3 3.8; cholesterol 239, triglycerides 65, HDL 75, LDL 151; CMP normal  Labs 1/61/09: TSH 5.01, free T4 1.45, TPO antibody <1, anti-thyroglobulin antibody <1; CMP normal, with calcium 10.0; CBC normal  Labs 04/27/15: HbA1c 5.5%; TSH 3.61, free T4 1.4, free T3 2.2; cholesterol 258, triglycerides 96, HDL 77, LDL 162; PTH 32, calcium 10.0, 25-OH vitamin d 33  Labs 01/12/15: TSH 0.885, free T4 1.88, free T3 3.01; CBC: Normal except MCV 98; CMP normal; vitamin B12 (344 (normal 239-931), folic acid 19.7 (normal >5.4); PTH 80.49 (normal 12-72), calcium 9.10 (normal 8.5-10.6), 25-OH vitamin D 32.6; cholesterol 231, triglycerides 83, HDL 66, LDL 148  Labs 09/24/14: Ordered, but not performed  Labs 05/21/14: TSH  0.490, free T4 1.46, free T3 2.4; HbA1C 5.8%; calcium 9.3, PTH 52; 25-OH vitamin D 40; cholesterol 246, triglycerides 92, HDL 70, LDL 176; calcitonin <2  Labs 02/09/14: TSH 2.96, free T4 1.43, free T3 2.2  Labs 02/05/14: Urinalysis: large blood, 15 ketones; alkaline phosphatase 108 (normal 32-92);   Labs 02/02/14: Urinalysis: trace blood, no ketones; Hgb 13.6, Hct 43.9, MCV 100.3; CMP normal, with alkaline phosphatase 16 (normal 40-129); TSH 3.03; B12 305 (211-946); cholesterol 282, triglycerides 68, HDL 79, LDL 189; 25-hydroxy vitamin D >60  Labs 07/11/13: B12 236 (normal 211-911), calcitonin < 2 (normal < 6)  Labs 03/10/13: CMP normal. TSH 1.456, free T4 1.36, free T3 2.6; cholesterol 194, triglycerides 82, HDL 72, LDL 106  Labs 11/18/13: TSH 0.539, free T4 1.30, free T3 2.6  Labs 10/16/12: CMP normal; TSH 2.20, free T4 1.10, free T3 2.2; cholesterol 202, triglycerides 77, HDL 82, LDL 105  Labs 06/18/12: TSH 1.260, free T4 1.58, free T3 2.5, cholesterol 211, triglycerides 71, HDL 78, LDL 119  Labs 02/13/12: TSH 0.235, free T4 1.83, cholesterol 236, triglycerides 85, HDL 68, LDL 151. Had been back on Crestor for about 3 weeks, but was off it for about 7 days prior to the labs being drawn.   Labs 01/09/12: AST 45, ALT 70, cholesterol 298, triglycerides 85, HDL 72, LDL 209, TSH 0.44, free T4 1.52, 25-hydroxy,  vitamin D 41. Had been off Crestor for months.   Labs 09/06/11: TSH 0.663, free T4 1.65, T3 117, cholesterol 225, triglycerides 65, HDL 84, LDL 128  Labs 05/09/11: TSH 6.045. Free T4: 1.66. CMP is normal. Cholesterol 214, triglycerides 67, HDL was 73, LDL 128. 25-OH-vitamin D 41.7. She started Zetia as of  05/15/11.  ASSESSMENT: 1. Hypothyroid, s/p I-131:   A. Her TFTS in August 2018 were normal on her Synthroid dose of 137 mcg/day for 6 days each week.    C. In February 2019 she was mildly hyperthyroid on that same dose. After changing the dose to 112 mcg/day, her TFTS on 08/02/17 were  mid-normal.      2. Goiter: Thyroid gland is again within normal limits for size today.   3. Thyroiditis: Hashimoto's disease is clinically quiescent today. The non-physiologic shifts in her TFTs  at times are c/w intermittent Hashimoto's Dz activity. 4. Prediabetes/Elevated HbA1c: Her HbA1c was up into the prediabetes zone in August 2018, but has decreased back into the normal range since then. 5. Obesity: Her weight is unchanged. Her GI problems and her arthritis make it difficult for her to exercise. Swimming helps when she does it.      6. Hypertension: BP is good today. She still needs daily exercise.  7. Hypercholesterolemia: The patient's most recent total cholesterol and LDL in May 2018 2017 were higher than in November 2017. She needs to lose weight. We need to repeat her lipid panel.  8. Dyspepsia/GERD: She was diagnosed with a gastric ulcer in October 2018. She did not have any ulcer symptoms. She is now taking omeprazole. She feels that her reflux symptoms are decreased when she takes her medication and avoids spicy foods..   9. Tobacco: At this point in time she is not motivated to stop smoking. I again asked her to stop smoking. She again said that she wants to, but it's hard. I again reminded her that she has been able to recover fairly well from her previous stroke(s). She may not be so lucky the next time. If she wants to preserve her brain tissue and her abilities to be active and to live  Independently, she needs to stop smoking.  10. Edema: She has more edema today bilaterally, but worse in her leg that is adversely affected by her prior CVA(s). She sometimes takes in more salt than she needs.  11. 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 When she was on the diet her diarrhea stopped. When she went off the diet her diarrhea worsened. Her calcitonin was negative, ruling out hypercalcitoninemia as a cause of her diarrhea. Fortunately she is doing better now. She continues to  be followed by GI. No obvious lesions were seen during her colonoscopy in October. 12. Excess vitamin D: Her vitamin D levels in 2016, in 2017, and again in 2018 were normal.  13. S/p CVA:  Her MRI on 04/16/08 showed CNS defects in the right cerebrum. Her neurologic defects, however, were on the right side of her body. She may have had more CNS damage in her left cerebrum than had been recognized.  She seems to have recovered fairly well, but still has some right hemiparetic findings. 14. Hyperparathyroidism, secondary to inadequate calcium intake: Her PTH and calcium values in August 2018 were normal, but the calcium was at about the 25% of the normal range. Her calcium and PTH values in February 2019 were good.    15. Snoring: I suspect that she has OSA. She refuses to have a sleep study done.  16. Nasal congestion and associated symptoms: Her allergies have been acting up more this Spring, but Zyrtec helps.   17. Arthritis: I suspect that she has osteoarthritis. However, it is worth checking her uric acid level.  18. Plantar fasciitis: Some of her foot pain is due to planter fasciitis. I suggested that she purchase some good insoles with arch support. If she still has problems she will need to see a podiatrist.  19. Supraclavicular fullness: The prominence in the supraclavicular fossae has improved over time and is not evident today. It appears that the prominence was just fatty tissue.  PLAN: 1. Diagnostic: Uric acid today. Repeat TFTs, CMP, and lipid panel prior to next visit.    2. Therapeutic: Continue Synthroid dose of 112 mcg/day. Continue calcium dose of 900 mg/day. Continue  the vitamin D dose of 3,000 IU/day. Continue the Centrum Silver.  Stop smoking. Take Tums as above. 3. Patient education: We discussed her GI problems, her thyroiditis and hypothyroidism, her neuro issues, her edema, and her foot problems. I asked her again to stop smoking.  4. Follow-up: Follow up visit in 4  months.  Level of Service: This visit lasted in excess of 45 minutes. More than 50% of the visit was devoted to counseling.  Molli Knock, MD, CDE Adult and Pediatric Endocrinology

## 2017-08-08 NOTE — Patient Instructions (Signed)
Follow up visit in 4 months. Please have fasting lab tests done about one week prior.

## 2017-08-09 LAB — URIC ACID: URIC ACID, SERUM: 6.4 mg/dL (ref 2.5–7.0)

## 2017-08-22 ENCOUNTER — Encounter (INDEPENDENT_AMBULATORY_CARE_PROVIDER_SITE_OTHER): Payer: Self-pay | Admitting: *Deleted

## 2017-12-06 ENCOUNTER — Ambulatory Visit (INDEPENDENT_AMBULATORY_CARE_PROVIDER_SITE_OTHER): Payer: Medicare Other | Admitting: "Endocrinology

## 2017-12-10 ENCOUNTER — Other Ambulatory Visit (INDEPENDENT_AMBULATORY_CARE_PROVIDER_SITE_OTHER): Payer: Self-pay | Admitting: "Endocrinology

## 2017-12-11 LAB — COMPREHENSIVE METABOLIC PANEL
ALBUMIN: 4.5 g/dL (ref 3.5–5.5)
ALT: 29 IU/L (ref 0–32)
AST: 17 IU/L (ref 0–40)
Albumin/Globulin Ratio: 2.3 — ABNORMAL HIGH (ref 1.2–2.2)
Alkaline Phosphatase: 101 IU/L (ref 39–117)
BUN / CREAT RATIO: 19 (ref 9–23)
BUN: 15 mg/dL (ref 6–24)
Bilirubin Total: 0.3 mg/dL (ref 0.0–1.2)
CHLORIDE: 103 mmol/L (ref 96–106)
CO2: 25 mmol/L (ref 20–29)
CREATININE: 0.81 mg/dL (ref 0.57–1.00)
Calcium: 9.4 mg/dL (ref 8.7–10.2)
GLOBULIN, TOTAL: 2 g/dL (ref 1.5–4.5)
GLUCOSE: 94 mg/dL (ref 65–99)
POTASSIUM: 4.8 mmol/L (ref 3.5–5.2)
Sodium: 141 mmol/L (ref 134–144)
TOTAL PROTEIN: 6.5 g/dL (ref 6.0–8.5)

## 2017-12-11 LAB — T4, FREE: FREE T4: 1.39 ng/dL (ref 0.82–1.77)

## 2017-12-11 LAB — T3, FREE: T3, Free: 2.5 pg/mL (ref 2.0–4.4)

## 2017-12-11 LAB — LIPID PANEL W/O CHOL/HDL RATIO
Cholesterol, Total: 254 mg/dL — ABNORMAL HIGH (ref 100–199)
HDL: 72 mg/dL (ref >39)
LDL CALC: 163 mg/dL — AB (ref 0–99)
Triglycerides: 96 mg/dL (ref 0–149)
VLDL CHOLESTEROL CAL: 19 mg/dL (ref 5–40)

## 2017-12-11 LAB — TSH: TSH: 1.23 u[IU]/mL (ref 0.450–4.500)

## 2017-12-13 ENCOUNTER — Encounter (INDEPENDENT_AMBULATORY_CARE_PROVIDER_SITE_OTHER): Payer: Self-pay | Admitting: "Endocrinology

## 2017-12-13 ENCOUNTER — Ambulatory Visit (INDEPENDENT_AMBULATORY_CARE_PROVIDER_SITE_OTHER): Payer: Medicare Other | Admitting: "Endocrinology

## 2017-12-13 VITALS — BP 126/78 | HR 60 | Ht 61.42 in | Wt 188.8 lb

## 2017-12-13 DIAGNOSIS — M722 Plantar fascial fibromatosis: Secondary | ICD-10-CM

## 2017-12-13 DIAGNOSIS — E049 Nontoxic goiter, unspecified: Secondary | ICD-10-CM

## 2017-12-13 DIAGNOSIS — E662 Morbid (severe) obesity with alveolar hypoventilation: Secondary | ICD-10-CM | POA: Diagnosis not present

## 2017-12-13 DIAGNOSIS — R7303 Prediabetes: Secondary | ICD-10-CM

## 2017-12-13 DIAGNOSIS — Z6836 Body mass index (BMI) 36.0-36.9, adult: Secondary | ICD-10-CM

## 2017-12-13 DIAGNOSIS — E7800 Pure hypercholesterolemia, unspecified: Secondary | ICD-10-CM

## 2017-12-13 DIAGNOSIS — E211 Secondary hyperparathyroidism, not elsewhere classified: Secondary | ICD-10-CM

## 2017-12-13 DIAGNOSIS — I10 Essential (primary) hypertension: Secondary | ICD-10-CM

## 2017-12-13 DIAGNOSIS — E66812 Obesity, class 2: Secondary | ICD-10-CM

## 2017-12-13 DIAGNOSIS — E063 Autoimmune thyroiditis: Secondary | ICD-10-CM | POA: Diagnosis not present

## 2017-12-13 DIAGNOSIS — E673 Hypervitaminosis D: Secondary | ICD-10-CM

## 2017-12-13 DIAGNOSIS — F17200 Nicotine dependence, unspecified, uncomplicated: Secondary | ICD-10-CM

## 2017-12-13 LAB — POCT GLYCOSYLATED HEMOGLOBIN (HGB A1C): Hemoglobin A1C: 5.5 % (ref 4.0–5.6)

## 2017-12-13 LAB — POCT GLUCOSE (DEVICE FOR HOME USE): POC Glucose: 91 mg/dl (ref 70–99)

## 2017-12-13 NOTE — Progress Notes (Signed)
CC: Follow-up hypothyroid s/p I-131 treatment, hypertension, goiter, dyspepsia, fatigue, GERD, Hashimoto's disease, status post CVA, partial right hemiparesis, hyperlipidemia, smoking, prediabetes, and supraclavicular fullness    History of present illness: [name] [name] is a 57 year old Caucasian woman.   1. [name] [name] first presented to me on 08/10/04 in referral from her primary care provider, Dr. Frederica Kuster in [place], [place], for evaluation and management of hyperthyroidism. In retrospect, the patient had chronic problems with infected sinuses, inner ear symptoms, and spinning dizziness. She also had complaints of severe fatigue and weight gain. Dr. Jonette Eva had performed thyroid tests on 06/14/04. TSH was 0.13, free T4 1.04. Thyroid ultrasound showed small and large nodules in a thyroid gland that was somewhat enlarged overall. On examination she had a 20+ gram thyroid gland. Thyroid gland was firm, but nontender. She also had some diastolic hypertension. Thyroid tests showed a TSH of 0.437, free T4 1.01, and free T3 of 2.6. Her TPO antibody level was 29 9. Her TSI level was 98. It appeared at that time that the patient likely had some elevation of thyroid hormone due to Hashimoto's disease or had a combination of intermittently active Hashimoto's Disease and intermittently active  Graves' Disease.   2. During the next 2 years, her TSH values varied from 0.350-0.854. By February of 2008, however, her TSH decreased to 0.008, her free T4 increased to 2.08, and her free T3 increased to 5.8. She was clearly hyperthyroid and thyrotoxic. TSI level at that point was 1.8 with normal being 1.3 or less. On 05/25/06 she underwent radioactive iodine treatment with I-131. She subsequently became hypothyroid and was started on Synthroid replacement therapy. Since then her doses of Synthroid have been adjusted several times.   3. During the past 11 years the patient has had several other medical problems:  A. She has had  one or more CVAs/TIAs, which have resulted in some mild right hemiparesis and some transient memory losses. Her MRI of the brain in February 2010 showed evidence for remote infarcts involving the posterior right temporal lobe and parietal lobe.    B. She also had a 3-4 month episode of spontaneous galactorrhea in 2010. Brain MRI showed a 2 x 3 mm area of delayed enhancement seen posteriorly on the left side of her pituitary gland. I felt at the time that it would be very uncharacteristic for a true prolactinoma this small to actually produce hyperprolactinemia and clinical galactorrhea. The galactorrhea subsequently resolved spontaneously.   C. On 08/02/2010 she presented with increased prominence of both supraclavicular areas. A CT scan was performed which showed no significant pathology. She was evaluated by Dr. Christia Reading of [facility]. Dr. Jenne Pane could not identify any cause of the supraclavicular area prominence other than obesity. Since then, that prominence has waxed and waned with her changes in weight.    D. The patient was started on Crestor for hyperlipidemia, but initially could not tolerate even the 5 mg dose more than twice per week. She was subsequently started on Zetia, 10 mg/day, which she tolerated better, but did not control her lipids as well. Crestor was later re-started at a lower dose and she was able to tolerate that medication. However, she later discontinued the Crestor due to symptoms.   E. She has had multiple GI complaints over time, to include severe, chronic diarrhea, heartburn, and abdominal pains. In the Summer of 2015 she saw a gastroenterologist who performed a colonoscopy and removed a polyp. He also put her on a gluten-free  and casein-free diet. Her symptoms improved on that diet, but she has not been consistent with that diet for quite some time.    F. The patient has been a smoker for many years. She successfully stopped smoking by using Chantix several years ago, but  later resumed smoking. She hs been very reluctant to try to stop smoking again.   3. The patient's last [facility] visit was on 08/08/17. I continued her Synthroid dose of 112 mcg/day, her calcium, and her vitamin D.    A. She has been healthy.  B. She still occasionally has pains in her right foot if she does not have adequate arch support. If she drives long distances she also gets ankle edema. She is not eating out at restaurants as often.    C. She takes omeprazole only as needed.   D. She continues to have intermittent diarrhea, but less often in the past month. She is also doing somewhat better at following her gluten-free diet, but still uses organic flour that is not gluten-free.  She is dong better with sourdough bread.  E. She also has fairly consistent joint and soft tissue aches and pains.  F. She still has one kidney stone, but has not had any further episodes of passing stone fragments.   G. She has not had any further stroke symptoms. Her neurologist continued her on Plavix, 75 mg/day.    H. She take Tums, Centrum Silver, calcium 900 mg, and 3,000 IU of vitamin D once daily.   I. She is still smoking.   J. She still has not had her sleep apnea evaluation because she does not want to wear a C-pap device like her mother wore many years ago.   K. Her allergies have not been acting up very much.     L. She is doing better emotionally since her mother's death in 30-Jun-2019but some days are better than others.    3. Pertinent Review of Systems: Constitutional: She says, "I'm feeling fine." She still snores "like a freight train" and still frequently wakes up choking. Eyes: Near vision is good with her new glasses. She had an eye exam in December 2018. She was told that she had the beginning stages of macular degeneration. She also had the beginning of cataracts. Her eyes showed signs of high blood pressure. She missed her follow up exam during the Summer of 2019.   Neck: She continues to have  intermittent problems with swallowing. 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 She has no complaints of anterior neck swelling, soreness, tenderness, pressure, or discomfort. She continues to have episodic trapezius spasm and pinching of the cervical nerves.   Heart: She occasionally feels a fast heart rate when she lies on her left side at night. 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 Her cardiologist confirmed that diagnosis at her follow up appointment in September or October 2016. The patient has no other complaints of irregular heat beats, chest pain, or chest pressure. Gastrointestinal: She has normal stools most frequently, but watery or mushy stools on some days. Her GERD and heartburn are better when she avoids spicy foods and takes her omeprazole.   Legs: As above. She still occasionally has the sensation of something crawling on her skin in the upper right thigh and discomfort of her right patella. Muscle mass and strength seem unchanged. Her right side remains  somewhat weaker than her left. She still notes some burning of her calves at times. Feet: She sometimes has numbness, burning, and "tickling" in her feet, more often on the right foot. She notes some edema at times. Her right leg has become stronger over time, so she only rarely drags her right foot anymore.  Neuro: She has persistent bilateral numbness in her thumbs and first two fingers bilaterally.  Mental/psychological: Her abilities to think, to pay attention, to remember, and to make decisions are good. "I ain't eating checkers."  REVIEW OF SYSTEMS: There are no other significant problems involving her other body systems.  PAST MEDICAL, FAMILY, AND SOCIAL HISTORY:  1. Family: She still lives alone. She has frequent social interactions with friends and family. She has two sisters with hypothyroidism. Her daughter is also having thyroid  problems. 2. Activities: She has not been very physically active, but she does walk more.   3. Tobacco: She is still smoking.   4. Primary care provider: Dr. Donnita Falls, MD in [place].  5. Neurologist: Dr. Lacey Jensen, [facility], phone [phone] 6. Cardiologist: Dr. Marny Lowenstein in [place].   PHYSICAL EXAM: BP: 126/78, HR 60, weight 188 pounds and 12.8 ounces. Her weight has decreased 4 pounds.    Constitutional: The patient looks healthy and alert today. Her sense of humor is good. Her insight and affect are normal. Her memory is clinically normal. She is quite mentally sharp today.  Face: Her face appears normal.   Eyes: There is no arcus or proptosis.  Mouth: The oropharynx appears normal. The tongue appears normal, but is tobacco stained. There is normal oral moisture. There is no obvious gingivitis. Neck: There are no bruits present. The thyroid gland appears normal in size. The thyroid gland is again normal at approximately 18-20 grams in size. Both lobes are within normal size today. The consistency of the thyroid gland is normal. There is no thyroid tenderness to palpation. Her previous bilateral supraclavicular fullness is not present today.  Lungs: The lungs are clear anteriorly and posteriorly. Air movement is good. Heart: The heart rhythm and rate appear normal. Heart sounds S1 and S2 are normal. I do not appreciate any pathologic heart murmurs. Abdomen: The abdomen is enlarged. Bowel sounds are normal. The abdomen is soft and non-tender. There is no obviously palpable hepatomegaly, splenomegaly, or other masses.      Arms: Muscle mass appears appropriate for age.  Hands: She has no tremor of her hands today. Phalangeal and metacarpophalangeal joints appear normal. Palms are normal. Legs: Muscle mass appears appropriate for age. There is no edema in her legs today.   Feet: She has a faint 1+ right DP pulse and a 1+ left DP pulse. She has no swelling of her ankles  and feet today. The right plantar fascia is not tender to palpation in the mid-foot.  Neurologic: Right hand grip is again weaker than the left handgrip, but essentially unchanged. Right hip flexors are again weaker than the left hip flexors, but essentially unchanged. Sensation to touch is intact in her legs today.      LABS:   Labs 12/13/17: HbA1c 5.5%, CBG 91  Labs 12/10/17: TSH 1.23, free T4 1.39, free T3 2.5; CMP normal; cholesterol 254, triglycerides 96, HDL 71, LDL 163  Labs 08/08/17: HbA1c 5.4%, CBG 94  Labs 08/02/17: TSH 1.17, free T4 1.28, free T3 2.3; uric acid 6.4 (ref 2.5-7.0)  Labs 05/08/17: HbA1c 5.6%, CBG 88  Labs 04/19/17: TSH 0.453, free T4  1.61, free T3 2.7; Calcium 9.8, PTH 47; C-peptide 3.3  Labs 11/01/16: HbA1c 5.7%, CBG 84; TSH 1.23, free T4 1.5, free T3 2.7; PTH 54 (ref 14-64), calcium 9.1, 25-OH vitamin D 31  Labs 08/01/16: HbA1c 5.5%; TSH 7.10, free T4 1.0. Free T3 2.2; cholesterol 283, triglycerides 101, HDL 83, LDL 180  Labs 07/06/16: CBC normal, except RBC 3.96 (ref 4.04-5.48) and MCH 32.3 (ref 27-31.2), CMP normal  Labs 04/25/16: HbA1c 5.3%, CBG 105  Labs 01/21/16: HbA1c 5.8%; calcium 9.4, PTH 52, 25-OH vitamin D 35; TSH 0.01, free T4 1.8, free T3 3.8; cholesterol 239, triglycerides 65, HDL 75, LDL 151; CMP normal  Labs 1/61/09: TSH 5.01, free T4 1.45, TPO antibody <1, anti-thyroglobulin antibody <1; CMP normal, with calcium 10.0; CBC normal  Labs 04/27/15: HbA1c 5.5%; TSH 3.61, free T4 1.4, free T3 2.2; cholesterol 258, triglycerides 96, HDL 77, LDL 162; PTH 32, calcium 10.0, 25-OH vitamin d 33  Labs 01/12/15: TSH 0.885, free T4 1.88, free T3 3.01; CBC: Normal except MCV 98; CMP normal; vitamin B12 (344 (normal 239-931), folic acid 19.7 (normal >5.4); PTH 80.49 (normal 12-72), calcium 9.10 (normal 8.5-10.6), 25-OH vitamin D 32.6; cholesterol 231, triglycerides 83, HDL 66, LDL 148  Labs 09/24/14: Ordered, but not performed  Labs 05/21/14: TSH 0.490, free T4 1.46,  free T3 2.4; HbA1C 5.8%; calcium 9.3, PTH 52; 25-OH vitamin D 40; cholesterol 246, triglycerides 92, HDL 70, LDL 176; calcitonin <2  Labs 02/09/14: TSH 2.96, free T4 1.43, free T3 2.2  Labs 02/05/14: Urinalysis: large blood, 15 ketones; alkaline phosphatase 108 (normal 32-92);   Labs 02/02/14: Urinalysis: trace blood, no ketones; Hgb 13.6, Hct 43.9, MCV 100.3; CMP normal, with alkaline phosphatase 16 (normal 40-129); TSH 3.03; B12 305 (211-946); cholesterol 282, triglycerides 68, HDL 79, LDL 189; 25-hydroxy vitamin D >60  Labs 07/11/13: B12 236 (normal 211-911), calcitonin < 2 (normal < 6)  Labs 03/10/13: CMP normal. TSH 1.456, free T4 1.36, free T3 2.6; cholesterol 194, triglycerides 82, HDL 72, LDL 106  Labs 11/18/13: TSH 0.539, free T4 1.30, free T3 2.6  Labs 10/16/12: CMP normal; TSH 2.20, free T4 1.10, free T3 2.2; cholesterol 202, triglycerides 77, HDL 82, LDL 105  Labs 06/18/12: TSH 1.260, free T4 1.58, free T3 2.5, cholesterol 211, triglycerides 71, HDL 78, LDL 119  Labs 02/13/12: TSH 0.235, free T4 1.83, cholesterol 236, triglycerides 85, HDL 68, LDL 151. Had been back on Crestor for about 3 weeks, but was off it for about 7 days prior to the labs being drawn.   Labs 01/09/12: AST 45, ALT 70, cholesterol 298, triglycerides 85, HDL 72, LDL 209, TSH 0.44, free T4 1.52, 25-hydroxy,  vitamin D 41. Had been off Crestor for months.   Labs 09/06/11: TSH 0.663, free T4 1.65, T3 117, cholesterol 225, triglycerides 65, HDL 84, LDL 128  Labs 05/09/11: TSH 6.045. Free T4: 1.66. CMP is normal. Cholesterol 214, triglycerides 67, HDL was 73, LDL 128. 25-OH-vitamin D 41.7. She started Zetia as of  05/15/11.  ASSESSMENT: 1. Hypothyroid, s/p I-131:   A. Her TFTs in August 2018 were normal on her Synthroid dose of 137 mcg/day for 6 days each week.    C. In February 2019 she was mildly hyperthyroid on that same dose. After changing the dose to 112 mcg/day, her TFTS on 08/02/17 and on 12/10/17 were  mid-normal.      2. Goiter: Thyroid gland is again within normal limits for size today.   3. Thyroiditis: Hashimoto's disease  is clinically quiescent today. The non-physiologic shifts in her TFTs at times are c/w intermittent Hashimoto's Dz activity. 4. Prediabetes/Elevated HbA1c: Her HbA1c was up into the prediabetes zone in August 2018, but has decreased back into the normal range since then. 5. Obesity: Her weight has decreased. Her GI problems and her arthritis make it difficult for her to exercise. Swimming helps when she does it.      6. Hypertension: BP is good today. She still needs daily exercise.  7. Hypercholesterolemia: The patient's most recent total cholesterol and LDL in May 2018 2017 were higher than in November 2017. Her LDL in October 2019 was higher. We discussed the possible use of injectable PCSK9 inhibitor medications. She needs to lose weight.   8. Dyspepsia/GERD: She was diagnosed with a gastric ulcer in October 2018. She did not have any ulcer symptoms. She is now taking omeprazole. She feels that her reflux symptoms are decreased when she takes her medication and avoids spicy foods.   9. Tobacco: At this point in time she is not motivated to stop smoking. I again asked her to stop smoking. She again said that she wants to, but it's hard. I again reminded her that she has been able to recover fairly well from her previous stroke(s). She may not be so lucky the next time. If she wants to preserve her brain tissue and her abilities to be active and to live  Independently, she needs to stop smoking.  10. Edema: She has no edema today.  11. 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 When she was on the diet her diarrhea stopped. When she went off the diet her diarrhea worsened. Her calcitonin was negative, ruling out hypercalcitoninemia as a cause of her diarrhea. Fortunately she is doing better now. She continues to be followed by GI. No obvious lesions were seen during  her colonoscopy in October. 12. Excess vitamin D: Her vitamin D levels in 2016, in 2017, and again in 2018 were normal.  13. S/p CVA:  Her MRI on 04/16/08 showed CNS defects in the right cerebrum. Her neurologic defects, however, were on the right side of her body. She may have had more CNS damage in her left cerebrum than had been recognized.  She seems to have recovered fairly well, but still has some right hemiparetic findings. 14. Hyperparathyroidism, secondary to inadequate calcium intake: Her PTH and calcium values in August 2018 were normal, but the calcium was at about the 25% of the normal range. Her calcium and PTH values in February 2019 were good.    15. Snoring: I suspect that she has OSA. She refuses to have a sleep study done.  16. Nasal congestion and associated symptoms: Her allergies have been acting up more this Spring, but Zyrtec helps.   17. Arthritis: I suspect that she has osteoarthritis. Her uric acid level was normal in may 2019.  18. Plantar fasciitis: Some of her foot pain is due to planter fasciitis. I suggested that she purchase some good insoles with arch support. The arch supports are helping her.   19. Supraclavicular fullness: The prominence in the supraclavicular fossae has improved over time and is not evident today. It appears that the prominence was just fatty tissue.  PLAN: 1. Diagnostic: Repeat calcium, PTH, 25-OH vitamin D, and lipid panel prior to next visit.    2. Therapeutic: Continue Synthroid dose of 112 mcg/day. Continue calcium dose of 900 mg/day. Continue the vitamin D dose of 3,000 IU/day.  Continue the Centrum Silver.  Stop smoking. Take Tums as above. 3. Patient education: We discussed her GI problems, her thyroiditis and hypothyroidism, her neuro issues, her edema, and her foot problems. I asked her again to stop smoking.  4. Follow-up: Follow up visit in 4 months.  Level of Service: This visit lasted in excess of 50 minutes. More than 50% of the  visit was devoted to counseling.  Molli Knock, MD, CDE Adult and Pediatric Endocrinology

## 2017-12-13 NOTE — Patient Instructions (Signed)
Follow up visit in 4 months.  

## 2018-04-08 ENCOUNTER — Other Ambulatory Visit (INDEPENDENT_AMBULATORY_CARE_PROVIDER_SITE_OTHER): Payer: Self-pay | Admitting: "Endocrinology

## 2018-04-15 ENCOUNTER — Ambulatory Visit (INDEPENDENT_AMBULATORY_CARE_PROVIDER_SITE_OTHER): Payer: Medicare Other | Admitting: "Endocrinology

## 2018-05-13 ENCOUNTER — Encounter (INDEPENDENT_AMBULATORY_CARE_PROVIDER_SITE_OTHER): Payer: Self-pay | Admitting: "Endocrinology

## 2018-05-13 ENCOUNTER — Ambulatory Visit (INDEPENDENT_AMBULATORY_CARE_PROVIDER_SITE_OTHER): Payer: Medicare Other | Admitting: "Endocrinology

## 2018-05-13 VITALS — BP 122/76 | HR 68 | Ht 61.42 in | Wt 195.6 lb

## 2018-05-13 DIAGNOSIS — R7303 Prediabetes: Secondary | ICD-10-CM | POA: Diagnosis not present

## 2018-05-13 DIAGNOSIS — E211 Secondary hyperparathyroidism, not elsewhere classified: Secondary | ICD-10-CM

## 2018-05-13 DIAGNOSIS — Z720 Tobacco use: Secondary | ICD-10-CM

## 2018-05-13 DIAGNOSIS — M722 Plantar fascial fibromatosis: Secondary | ICD-10-CM

## 2018-05-13 DIAGNOSIS — E049 Nontoxic goiter, unspecified: Secondary | ICD-10-CM

## 2018-05-13 DIAGNOSIS — E063 Autoimmune thyroiditis: Secondary | ICD-10-CM

## 2018-05-13 DIAGNOSIS — E890 Postprocedural hypothyroidism: Secondary | ICD-10-CM

## 2018-05-13 DIAGNOSIS — K219 Gastro-esophageal reflux disease without esophagitis: Secondary | ICD-10-CM

## 2018-05-13 DIAGNOSIS — I10 Essential (primary) hypertension: Secondary | ICD-10-CM

## 2018-05-13 DIAGNOSIS — E559 Vitamin D deficiency, unspecified: Secondary | ICD-10-CM

## 2018-05-13 DIAGNOSIS — E7800 Pure hypercholesterolemia, unspecified: Secondary | ICD-10-CM

## 2018-05-13 MED ORDER — RABEPRAZOLE SODIUM 20 MG PO TBEC: DELAYED_RELEASE_TABLET | ORAL | 5 refills | Status: DC

## 2018-05-13 NOTE — Patient Instructions (Signed)
Follow up visit in 4 months.  

## 2018-05-13 NOTE — Progress Notes (Signed)
CC: Follow-up hypothyroid s/p I-131 treatment, hypertension, goiter, dyspepsia, fatigue, GERD, Hashimoto's disease, status post CVA, partial right hemiparesis, hyperlipidemia, smoking, prediabetes, and supraclavicular fullness    History of present illness: [name] [name] is a 58 year old Caucasian woman.   1. Ms Deichmann first presented to me on 08/10/04 in referral from her primary care provider, Dr. Frederica Kuster in [place], [place], for evaluation and management of hyperthyroidism. In retrospect, the patient had chronic problems with infected sinuses, inner ear symptoms, and spinning dizziness. She also had complaints of severe fatigue and weight gain. Dr. Jonette Eva had performed thyroid tests on 06/14/04. TSH was 0.13, free T4 1.04. Thyroid ultrasound showed small and large nodules in a thyroid gland that was somewhat enlarged overall. On examination she had a 20+ gram thyroid gland. Thyroid gland was firm, but nontender. She also had some diastolic hypertension. Thyroid tests showed a TSH of 0.437, free T4 1.01, and free T3 of 2.6. Her TPO antibody level was 29 9. Her TSI level was 98. It appeared at that time that the patient likely had some elevation of thyroid hormone due to Hashimoto's disease or had a combination of intermittently active Hashimoto's Disease and intermittently active  Graves' Disease.   2. During the next 2 years, her TSH values varied from 0.350-0.854. By February of 2008, however, her TSH decreased to 0.008, her free T4 increased to 2.08, and her free T3 increased to 5.8. She was clearly hyperthyroid and thyrotoxic. TSI level at that point was 1.8 with normal being 1.3 or less. On 05/25/06 she underwent radioactive iodine treatment with I-131. She subsequently became hypothyroid and was started on Synthroid replacement therapy. Since then her doses of Synthroid have been adjusted several times.   3. During the past 12 years the patient has had several other medical problems:  A. She has had  one or more CVAs/TIAs, which have resulted in some mild right hemiparesis and some transient memory losses. The MRI of her brain in February 2010 showed evidence for remote infarcts involving the posterior right temporal lobe and parietal lobe.    B. She also had a 3-4 month episode of spontaneous galactorrhea in 2010. Brain MRI showed a 2 x 3 mm area of delayed enhancement seen posteriorly on the left side of her pituitary gland. I felt at the time that it would be very uncharacteristic for a true prolactinoma this small to actually produce hyperprolactinemia and clinical galactorrhea. The galactorrhea subsequently resolved spontaneously.   C. On 08/02/2010 she presented with increased prominence of both supraclavicular areas. A CT scan was performed which showed no significant pathology. She was evaluated by Dr. Christia Reading of [facility]. Dr. Jenne Pane could not identify any cause of the supraclavicular area prominence other than obesity. Since then, that prominence has waxed and waned with her changes in weight.    D. The patient was started on Crestor for hyperlipidemia, but initially could not tolerate even the 5 mg dose more than twice per week. She was subsequently started on Zetia, 10 mg/day, which she tolerated better, but did not control her lipids as well. Crestor was later re-started at a lower dose and she was able to tolerate that medication. However, she later discontinued the Crestor due to symptoms.   E. She has had multiple GI complaints over time, to include severe, chronic diarrhea, heartburn, and abdominal pains. In the Summer of 2015 she saw a gastroenterologist who performed a colonoscopy and removed a polyp. He also put her on a gluten-free  and casein-free diet. Her symptoms improved on that diet, but she has not been consistent with that diet for quite some time.    F. The patient has been a smoker for many years. She successfully stopped smoking by using Chantix several years ago, but  later resumed smoking. She has been very reluctant to try to stop smoking again.   3. The patient's last [facility] visit was on 01/13/18. I continued her Synthroid dose of 112 mcg/day, her calcium dose of 900 mg/day, her vitamin D dose of 3,000 IU/day, and her Centrum Silver once a day.     A. She has been healthy.  B. She now has fairly persistent pains in both feet, especially if she does not have enough arch support.  These pains tend to limit her willingness and ability to walk. Her knees have also been hurting more. If she drives long distances she also gets ankle edema. She is not eating out at restaurants as often.    C. She is out of omeprazole, so has been eating Tums.    D. She continues to have intermittent diarrhea, some days better and some days worse. She is not doing well.at following her gluten-free diet and still uses organic flour that is not gluten-free.    E. She also has fairly consistent joint and soft tissue aches and pains. She has been having more low back pains recently.  F. She still has one kidney stone, but has not had any further episodes of passing stone fragments.   G. She has not had any further stroke symptoms. Her neurologist continued her on Plavix, 75 mg/day.    H. She take Tums, Centrum Silver, calcium 900 mg, and 3,000 IU of vitamin D once daily.   I. She is still smoking.   J. She still has not had her sleep apnea evaluation because she does not want to wear a C-pap device like her mother wore many years ago.   K. Her allergies have not been acting up recently. She has a new puppy.      L. She is doing better emotionally.   M. She is still doing well cognitively. "'I'm not eating checkers yet."   3. Pertinent Review of Systems: Constitutional: She says, "I'm feeling pretty good except for all my aches and pains." She still snores "like a freight train" and still frequently wakes up choking. Eyes: Near vision is good with her new glasses. She had an eye exam in  December 2018. She was told that she had the beginning stages of macular degeneration. She also had the beginning of cataracts. Her eyes showed signs of high blood pressure. She missed her follow up exam during the Summer of 2019. I urged her to schedule a follow up exam.  Neck: She continues to have intermittent problems with swallowing. 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 She has no complaints of anterior neck swelling, soreness, tenderness, pressure, or discomfort. She continues to have episodic trapezius spasm and pinching of the cervical nerves.   Heart: She occasionally feels a fast heart rate when she lies on her left side at night. 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 Her cardiologist confirmed that diagnosis at her follow up appointment in September or October 2016. The patient has no other complaints of irregular heat beats, chest pain, or chest pressure. She has not re-scheduled a cardiology appointment  Gastrointestinal: She still has frequent, intermittent diarrhea. Her GERD and heartburn are better when she avoids spicy foods and takes her omeprazole.   Legs: As above. She still occasionally has the sensation of something crawling on her skin in the upper right thigh and discomfort of her right patella. Muscle mass and strength seem unchanged. Her right side remains somewhat weaker than her left. She still notes some burning of her calves at times. She occasionally has pedal edema.  Feet: She sometimes has numbness, burning, and "tickling" in her feet, more often on the right foot. She notes some edema at times. Her right leg has become stronger over time, so she only rarely drags her right foot anymore.  Neuro: She has persistent bilateral numbness in her thumbs and first two fingers bilaterally.  Mental/psychological: Her abilities to think, to pay attention, to remember, and to make decisions are  good. "I ain't eating checkers."  REVIEW OF SYSTEMS: There are no other significant problems involving her other body systems.  PAST MEDICAL, FAMILY, AND SOCIAL HISTORY:  1. Family: She still lives alone. She has frequent social interactions with friends and family. She has two sisters with hypothyroidism. Her daughter is also having thyroid problems. 2. Activities: She has not been very physically active, but she does walk more with her puppy.   3. Tobacco: She is still smoking.   4. Primary care provider: Dr. Donnita Falls, MD in [place].  5. Neurologist: Dr. Lacey Jensen, [facility], phone [phone] 6. Cardiologist: Dr. Marny Lowenstein in [place]. She says that she wants to find a cardiologist in [place].   PHYSICAL EXAM: BP: 122/76, HR 60, weight 195 pounds and 9.6 ounces. Her weight has increased 7 pounds.    Constitutional: The patient looks healthy and alert today. Her sense of humor is good. Her insight and affect are normal. Her memory is clinically normal. She is quite mentally sharp today.  Face: Her face appears normal.   Eyes: There is no arcus or proptosis.  Mouth: The oropharynx appears normal. The tongue appears normal, but is tobacco stained. There is normal oral moisture. There is no obvious gingivitis. Neck: There are no bruits present. The thyroid gland appears normal in size. The thyroid gland is again normal at approximately 18-20 grams in size. Both lobes are within normal size today. The consistency of the thyroid gland is normal. There is no thyroid tenderness to palpation. Her previous bilateral supraclavicular fullness is not present today.  Lungs: The lungs are clear anteriorly and posteriorly. Air movement is good. Heart: The heart rhythm and rate appear normal. Heart sounds S1 and S2 are normal. I do not appreciate any pathologic heart murmurs. Abdomen: The abdomen is enlarged. Bowel sounds are normal. The abdomen is soft and non-tender. There is no  obviously palpable hepatomegaly, splenomegaly, or other masses.      Arms: Muscle mass appears appropriate for age.  Hands: She has no tremor of her hands today. Phalangeal and metacarpophalangeal joints appear normal. Palms are normal. Legs: Muscle mass appears appropriate for age. There is no edema in her legs today.  Feet: She has a 1+ right DP pulse and a 1+ left DP pulse. She has no swelling of her ankles and feet today. The right plantar fascia is mildly tender to palpation in the mid-foot.  Neurologic: Right hand grip is again mildly weaker than the left handgrip, but essentially unchanged. Right hip flexors are again weaker than the left hip flexors, but essentially unchanged. Sensation to  touch is intact in her legs today.      LABS:   Labs 05/07/18: HbA1c 5.6%; TSH 1.02, free T4 1.58, free T3 2.7; CMP normal with calcium 9.5; CBC normal; cholesterol 239, triglycerides 110, HDL 75, LDL 142;   Labs 12/13/17: ZOX0R 5.5%, CBG 91  Labs 12/10/17: TSH 1.23, free T4 1.39, free T3 2.5; CMP normal; cholesterol 254, triglycerides 96, HDL 71, LDL 163  Labs 08/08/17: HbA1c 5.4%, CBG 94  Labs 08/02/17: TSH 1.17, free T4 1.28, free T3 2.3; uric acid 6.4 (ref 2.5-7.0)  Labs 05/08/17: HbA1c 5.6%, CBG 88  Labs 04/19/17: TSH 0.453, free T4 1.61, free T3 2.7; Calcium 9.8, PTH 47; C-peptide 3.3  Labs 11/01/16: HbA1c 5.7%, CBG 84; TSH 1.23, free T4 1.5, free T3 2.7; PTH 54 (ref 14-64), calcium 9.1, 25-OH vitamin D 31  Labs 08/01/16: HbA1c 5.5%; TSH 7.10, free T4 1.0. Free T3 2.2; cholesterol 283, triglycerides 101, HDL 83, LDL 180  Labs 07/06/16: CBC normal, except RBC 3.96 (ref 4.04-5.48) and MCH 32.3 (ref 27-31.2), CMP normal  Labs 04/25/16: HbA1c 5.3%, CBG 105  Labs 01/21/16: HbA1c 5.8%; calcium 9.4, PTH 52, 25-OH vitamin D 35; TSH 0.01, free T4 1.8, free T3 3.8; cholesterol 239, triglycerides 65, HDL 75, LDL 151; CMP normal  Labs 08/07/52: TSH 5.01, free T4 1.45, TPO antibody <1, anti-thyroglobulin  antibody <1; CMP normal, with calcium 10.0; CBC normal  Labs 04/27/15: HbA1c 5.5%; TSH 3.61, free T4 1.4, free T3 2.2; cholesterol 258, triglycerides 96, HDL 77, LDL 162; PTH 32, calcium 10.0, 25-OH vitamin d 33  Labs 01/12/15: TSH 0.885, free T4 1.88, free T3 3.01; CBC: Normal except MCV 98; CMP normal; vitamin B12 (344 (normal 239-931), folic acid 19.7 (normal >5.4); PTH 80.49 (normal 12-72), calcium 9.10 (normal 8.5-10.6), 25-OH vitamin D 32.6; cholesterol 231, triglycerides 83, HDL 66, LDL 148  Labs 09/24/14: Ordered, but not performed  Labs 05/21/14: TSH 0.490, free T4 1.46, free T3 2.4; HbA1C 5.8%; calcium 9.3, PTH 52; 25-OH vitamin D 40; cholesterol 246, triglycerides 92, HDL 70, LDL 176; calcitonin <2  Labs 02/09/14: TSH 2.96, free T4 1.43, free T3 2.2  Labs 02/05/14: Urinalysis: large blood, 15 ketones; alkaline phosphatase 108 (normal 32-92);   Labs 02/02/14: Urinalysis: trace blood, no ketones; Hgb 13.6, Hct 43.9, MCV 100.3; CMP normal, with alkaline phosphatase 16 (normal 40-129); TSH 3.03; B12 305 (211-946); cholesterol 282, triglycerides 68, HDL 79, LDL 189; 25-hydroxy vitamin D >60  Labs 07/11/13: B12 236 (normal 211-911), calcitonin < 2 (normal < 6)  Labs 03/10/13: CMP normal. TSH 1.456, free T4 1.36, free T3 2.6; cholesterol 194, triglycerides 82, HDL 72, LDL 106  Labs 11/18/13: TSH 0.539, free T4 1.30, free T3 2.6  Labs 10/16/12: CMP normal; TSH 2.20, free T4 1.10, free T3 2.2; cholesterol 202, triglycerides 77, HDL 82, LDL 105  Labs 06/18/12: TSH 1.260, free T4 1.58, free T3 2.5, cholesterol 211, triglycerides 71, HDL 78, LDL 119  Labs 02/13/12: TSH 0.235, free T4 1.83, cholesterol 236, triglycerides 85, HDL 68, LDL 151. Had been back on Crestor for about 3 weeks, but was off it for about 7 days prior to the labs being drawn.   Labs 01/09/12: AST 45, ALT 70, cholesterol 298, triglycerides 85, HDL 72, LDL 209, TSH 0.44, free T4 1.52, 25-hydroxy,  vitamin D 41. Had been off  Crestor for months.   Labs 09/06/11: TSH 0.663, free T4 1.65, T3 117, cholesterol 225, triglycerides 65, HDL 84, LDL 128  Labs 05/09/11: TSH  1.700. Free T4: 1.66. CMP is normal. Cholesterol 214, triglycerides 67, HDL was 73, LDL 128. 25-OH-vitamin D 41.7. She started Zetia as of  05/15/11.  ASSESSMENT: 1. Hypothyroid, s/p I-131:   A. Her TFTs in August 2018 were normal on her Synthroid dose of 137 mcg/day for 6 days each week.    C. In February 2019 she was mildly hyperthyroid on that same dose. After changing the dose to 112 mcg/day, her TFTS on 08/02/17 and on 12/10/17 were mid-normal. Her TFTs were again mid-normal on 05/07/18.  2. Goiter: Thyroid gland is again within normal limits for size today.   3. Thyroiditis: Hashimoto's disease is clinically quiescent today. The non-physiologic shifts in her TFTs at times are c/w intermittent Hashimoto's Dz activity. 4. Prediabetes/Elevated HbA1c: Her HbA1c was up into the prediabetes zone in August 2018, but has decreased back into the normal range since then. Her HbA1c was at the upper end of the normal range on 05/07/18.  5. Obesity: Her weight has increased over the holidays. Part of the increase is due to consuming more carbs during the holidays. Part of the increase is due to walking less. Her GI problems and her arthritis make it difficult for her to exercise. Swimming helps when she does it.      6. Hypertension: SBP is good today. DBP is normal, but higher that it was when she walking more. She still needs daily exercise.  7. Hypercholesterolemia: The patient's total cholesterol and LDL in March 2020 were better than in October 2019, but still too high. We discussed the possible use of injectable PCSK9 inhibitor medications, but she is not in favor of that option . She needs to lose weight.   8. Dyspepsia/GERD: She was diagnosed with a gastric ulcer in October 2018. She did not have any ulcer symptoms. She was taking omeprazole. She feels that her reflux  symptoms have increased since she ran out of omeprazole.    9. Tobacco: At this point in time she is not motivated to stop smoking. I again asked her to stop smoking. She again said that she wants to, but it's hard. I again reminded her that she has been able to recover fairly well from her previous stroke(s). She may not be so lucky the next time. If she wants to preserve her brain tissue and her abilities to be active and to live  Independently, she needs to stop smoking.  10. Edema: She has no edema today.  11. 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 When she was on the diet her diarrhea stopped. When she went off the diet her diarrhea worsened. Her calcitonin was negative, ruling out hypercalcitoninemia as a cause of her diarrhea. Fortunately she is doing better now. She continues to be followed by GI. No obvious lesions were seen during her colonoscopy in October 2019. 12. Excess vitamin D: Her vitamin D levels in 2016, in 2017, and again in 2018 were normal.  13. S/p CVA:  Her MRI on 04/16/08 showed CNS defects in the right cerebrum. Her neurologic defects, however, were on the right side of her body. She may have had more CNS damage in her left cerebrum than had been recognized.  She seems to have recovered fairly well, but still has some right hemiparetic findings. 14. Hyperparathyroidism, secondary to inadequate calcium intake: Her PTH and calcium values in August 2018 were normal, but the calcium was at about the 25% of the normal range. Her calcium and PTH values  in February 2019 were good. We need to re-check these labs today.  15. Snoring: I suspect that she has OSA. She refuses to have a sleep study done.  16. Nasal congestion and associated symptoms: Her allergies have been acting up more this Spring, but Zyrtec helps.   17. Arthritis: I suspect that she has osteoarthritis. Her uric acid level was normal in may 2019.  18. Plantar fasciitis: Some of her foot pain is due to  planter fasciitis. I suggested that she purchase some good insoles with arch support. I also suggested that she see a podiatrist.    19. Supraclavicular fullness: The prominence in the supraclavicular fossae has improved over time and is not evident today. It appears that the prominence was just fatty tissue.  PLAN: 1. Diagnostic: Repeat calcium, PTH, 25-OH vitamin D now.    2. Therapeutic: Add rabeprazole, 20 mg/day. Discontinue omeprazole. Continue Synthroid dose of 112 mcg/day. Continue calcium dose of 900 mg/day. Continue the vitamin D dose of 3,000 IU/day. Continue the Centrum Silver.  Stop smoking. Take Tums as above. 3. Patient education: We discussed her GI problems, her thyroiditis and hypothyroidism, her cholesterols, her neuro issues, her edema, and her foot problems. I asked her again to stop smoking.  4. Follow-up: Follow up visit in 4 months.  Level of Service: This visit lasted in excess of 55 minutes. More than 50% of the visit was devoted to counseling.  Molli Knock, MD, CDE Adult and Pediatric Endocrinology

## 2018-05-14 LAB — PTH, INTACT AND CALCIUM
CALCIUM: 9.7 mg/dL (ref 8.6–10.4)
PTH: 35 pg/mL (ref 14–64)

## 2018-05-14 LAB — VITAMIN D 25 HYDROXY (VIT D DEFICIENCY, FRACTURES): VIT D 25 HYDROXY: 31 ng/mL (ref 30–100)

## 2018-05-20 ENCOUNTER — Encounter (INDEPENDENT_AMBULATORY_CARE_PROVIDER_SITE_OTHER): Payer: Self-pay | Admitting: *Deleted

## 2018-06-07 ENCOUNTER — Other Ambulatory Visit (INDEPENDENT_AMBULATORY_CARE_PROVIDER_SITE_OTHER): Payer: Self-pay | Admitting: "Endocrinology

## 2018-09-12 ENCOUNTER — Ambulatory Visit (INDEPENDENT_AMBULATORY_CARE_PROVIDER_SITE_OTHER): Payer: Medicare Other | Admitting: "Endocrinology

## 2018-09-12 ENCOUNTER — Encounter (INDEPENDENT_AMBULATORY_CARE_PROVIDER_SITE_OTHER): Payer: Self-pay | Admitting: "Endocrinology

## 2018-09-12 ENCOUNTER — Other Ambulatory Visit: Payer: Self-pay

## 2018-09-12 DIAGNOSIS — E049 Nontoxic goiter, unspecified: Secondary | ICD-10-CM | POA: Diagnosis not present

## 2018-09-12 DIAGNOSIS — E211 Secondary hyperparathyroidism, not elsewhere classified: Secondary | ICD-10-CM

## 2018-09-12 DIAGNOSIS — F17200 Nicotine dependence, unspecified, uncomplicated: Secondary | ICD-10-CM

## 2018-09-12 DIAGNOSIS — E890 Postprocedural hypothyroidism: Secondary | ICD-10-CM

## 2018-09-12 DIAGNOSIS — IMO0001 Reserved for inherently not codable concepts without codable children: Secondary | ICD-10-CM

## 2018-09-12 DIAGNOSIS — E063 Autoimmune thyroiditis: Secondary | ICD-10-CM | POA: Diagnosis not present

## 2018-09-12 DIAGNOSIS — R600 Localized edema: Secondary | ICD-10-CM

## 2018-09-12 DIAGNOSIS — R7303 Prediabetes: Secondary | ICD-10-CM

## 2018-09-12 DIAGNOSIS — R1013 Epigastric pain: Secondary | ICD-10-CM

## 2018-09-12 DIAGNOSIS — I69359 Hemiplegia and hemiparesis following cerebral infarction affecting unspecified side: Secondary | ICD-10-CM

## 2018-09-12 NOTE — Patient Instructions (Signed)
Follow up visit in 4 months. Please stop smoking.

## 2018-09-12 NOTE — Progress Notes (Signed)
CC: [name] [name] presents at this televisit today for follow-up hypothyroid s/p I-131 treatment, hypertension, goiter, dyspepsia, fatigue, GERD, Hashimoto's disease, status post CVA, partial right hemiparesis, hyperlipidemia, smoking, prediabetes, and supraclavicular fullness    History of present illness: [name] [name] is a 58 year old Caucasian woman.   1. [name] [name] first presented to me on 08/10/04 in referral from her primary care provider, Dr. Lonzo Cloud in [place], [place], for evaluation and management of hyperthyroidism. In retrospect, the patient had chronic problems with infected sinuses, inner ear symptoms, and spinning dizziness. She also had complaints of severe fatigue and weight gain. Dr. Raynelle Jan had performed thyroid tests on 06/14/04. TSH was 0.13, free T4 1.04. Thyroid ultrasound showed small and large nodules in a thyroid gland that was somewhat enlarged overall. On examination she had a 20+ gram thyroid gland. Thyroid gland was firm, but nontender. She also had some diastolic hypertension. Thyroid tests showed a TSH of 0.437, free T4 1.01, and free T3 of 2.6. Her TPO antibody level was 29 9. Her TSI level was 98. It appeared at that time that the patient likely had some elevation of thyroid hormone due to Hashimoto's disease or had a combination of intermittently active Hashimoto's Disease and intermittently active  Graves' Disease.   2. During the next 2 years, her TSH values varied from 0.350-0.854. By February of 2008, however, her TSH decreased to 0.008, her free T4 increased to 2.08, and her free T3 increased to 5.8. She was clearly hyperthyroid and thyrotoxic. TSI level at that point was 1.8 with normal being 1.3 or less. On 05/25/06 she underwent radioactive iodine treatment with I-131. She subsequently became hypothyroid and was started on Synthroid replacement therapy. Since then her doses of Synthroid have been adjusted several times.   3. During the past 12 years the patient has had  several other medical problems:  A. She has had one or more CVAs/TIAs, which have resulted in some mild right hemiparesis and some transient memory losses. The MRI of her brain in February 2010 showed evidence for remote infarcts involving the posterior right temporal lobe and parietal lobe.    B. She also had a 3-4 month episode of spontaneous galactorrhea in 2010. Brain MRI showed a 2 x 3 mm area of delayed enhancement seen posteriorly on the left side of her pituitary gland. I felt at the time that it would be very uncharacteristic for a true prolactinoma this small to actually produce hyperprolactinemia and clinical galactorrhea. The galactorrhea subsequently resolved spontaneously.   C. On 08/02/2010 she presented with increased prominence of both supraclavicular areas. A CT scan was performed which showed no significant pathology. She was evaluated by Dr. Melida Quitter of [facility]. Dr. Redmond Baseman could not identify any cause of the supraclavicular area prominence other than obesity. Since then, that prominence has waxed and waned with her changes in weight.    D. The patient was started on Crestor for hyperlipidemia, but initially could not tolerate even the 5 mg dose more than twice per week. She was subsequently started on Zetia, 10 mg/day, which she tolerated better, but did not control her lipids as well. Crestor was later re-started at a lower dose and she was able to tolerate that medication. However, she later discontinued the Crestor due to symptoms.   E. She has had multiple GI complaints over time, to include severe, chronic diarrhea, heartburn, and abdominal pains. In the Summer of 2015 she saw a gastroenterologist who performed a colonoscopy and removed a  polyp. He also put her on a gluten-free and casein-free diet. Her symptoms improved on that diet, but she has not been consistent with that diet for quite some time.    F. The patient has been a smoker for many years. She successfully  stopped smoking by using Chantix several years ago, but later resumed smoking. She has been very reluctant to try to stop smoking again.   3. The patient's last [facility] visit was on 05/13/18. I continued her Synthroid dose of 112 mcg/day, her calcium dose of 900 mg/day, her vitamin D dose of 3,000 IU/day, and her Centrum Silver once a day. I also added rabeprazole, 20 mg, daily.   A. She has been healthy. She is socially distancing. She had a tick bite a couple of weeks ago and was treated with antibiotics.   B. She still has episodic stabbing pains in her feet. She also gets cramps in her legs. Both of these pains tend to occur about 3 AM. These pains sometimes limit her willingness and ability to walk. Her knees have not been too bad. She also gets ankle edema at times. She is not eating out at restaurants as often.    C. She takes rabeprazole only as needed.     D. She continues to have intermittent diarrhea, some days better and some days worse. She is doing better at following her gluten-free diet.     E. She also has fairly consistent joint and soft tissue aches and pains. She has been having her usual low back pains.  F. She still has one kidney stone, but has not had any further episodes of passing stone fragments.   G. She has not had any further stroke symptoms. Her neurologist continued her on Plavix, 75 mg/day.    H. She take Tums, Centrum Silver, calcium 900 mg, and 3,000 IU of vitamin D once daily.   I. She is still smoking.   J. She still has not had her sleep apnea evaluation because she does not want to wear a C-pap device like her mother wore many years ago.   K. Her allergies have been acting up recently. She has a new puppy that is "precious and spoiled rotten"..      L. She is doing well emotionally. "The dog keeps me laughing."  M. She is still doing well cognitively. She again responded with her classic answer, "'I'm not eating checkers yet."   3. Pertinent Review of  Systems: Constitutional: She says, "I'm feeling pretty good, but I get tired and drained when it's hot outside. " She still snores "like a freight train" and still frequently wakes up choking. Eyes: Vision is good with her glasses. She had an eye exam in December 2018. She was told that she had the beginning stages of macular degeneration. She also had the beginning of cataracts. Her eyes showed signs of high blood pressure. She missed her follow up exam during the Summer of 2019. I again urged her to schedule a follow up exam.  Neck: She continues to have intermittent, infrequent problems with swallowing. 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 She has no complaints of anterior neck swelling, soreness, tenderness, pressure, or discomfort. She has not had any recent  trapezius spasm and pinching of the cervical nerves.   Heart: She occasionally feels a fast heart rate when she lies on her left side at night. 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 Her cardiologist confirmed that diagnosis at her follow up appointment in September or October 2016. The patient has no other complaints of irregular heat beats, chest pain, or chest pressure. She has not re-scheduled a cardiology appointment Gastrointestinal: She still has frequent, intermittent diarrhea. Her GERD and heartburn are better when she avoids spicy foods and takes her rabeprazole.   Legs: As above. She still occasionally has the sensation of something crawling on her skin in the upper right thigh and discomfort of her right patella. Muscle mass and strength seem unchanged. Her right side remains somewhat weaker than her left. She still notes some burning of her calves at times. She occasionally has pedal edema.  Feet: She sometimes has numbness, burning, and "tingling" in her feet, more often on the right foot. She notes some edema at times. Her right leg has become  stronger over time, so she only rarely drags her right foot anymore.  Neuro: She has persistent bilateral numbness in her thumbs and first two fingers bilaterally.  Mental/psychological: Her abilities to think, to pay attention, to remember, and to make decisions are good. "I ain't eating checkers." Skin: She has several new moles. Her PCP is setting up a derm appointment.  REVIEW OF SYSTEMS: There are no other significant problems involving her other body systems.  PAST MEDICAL, FAMILY, AND SOCIAL HISTORY:  1. Family: She still lives alone. She has frequent social interactions with friends and family. She has two sisters with hypothyroidism. Her daughter is also having thyroid problems. 2. Activities: She has not been very physically active, but she does walk more with her puppy.   3. Tobacco: She is still smoking.   4. Primary care provider: Dr. Donnita FallsWendy Rodriguez, [place] in [place].  5. Neurologist: Dr. Lacey JensenVictor Owusu, [facility], phone [phone] 6. Cardiologist: Dr. Marny LowensteinGhandi in [place]. She has not yet found a new cardiologist in [place].   PHYSICAL EXAM:  Her weight at home is 196 pounds with clothes on, down from 198 pounds last week. . She is alert, bright, and upbeat. Her affect and insight are very good.    LABS:   Labs 05/13/18; PTH 35, calcium 9.7, 25-OH vitamin D 31  Labs 05/07/18: HbA1c 5.6%; TSH 1.02, free T4 1.58, free T3 2.7; CMP normal with calcium 9.5; CBC normal; cholesterol 239, triglycerides 110, HDL 75, LDL 142;   Labs 12/13/17: [id number] 5.5%, CBG 91  Labs 12/10/17: TSH 1.23, free T4 1.39, free T3 2.5; CMP normal; cholesterol 254, triglycerides 96, HDL 71, LDL 163  Labs 08/08/17: HbA1c 5.4%, CBG 94  Labs 08/02/17: TSH 1.17, free T4 1.28, free T3 2.3; uric acid 6.4 (ref 2.5-7.0)  Labs 05/08/17: HbA1c 5.6%, CBG 88  Labs 04/19/17: TSH 0.453, free T4 1.61, free T3 2.7; Calcium 9.8, PTH 47; C-peptide 3.3  Labs 11/01/16: HbA1c 5.7%, CBG 84; TSH 1.23, free T4 1.5,  free T3 2.7; PTH 54 (ref 14-64), calcium 9.1, 25-OH vitamin D 31  Labs 08/01/16: HbA1c 5.5%; TSH 7.10, free T4 1.0. Free T3 2.2; cholesterol 283, triglycerides 101, HDL 83, LDL 180  Labs 07/06/16: CBC normal, except RBC 3.96 (ref 4.04-5.48) and MCH 32.3 (ref 27-31.2), CMP normal  Labs 04/25/16: HbA1c 5.3%, CBG 105  Labs 01/21/16: HbA1c 5.8%; calcium 9.4, PTH 52, 25-OH vitamin D 35; TSH 0.01, free T4 1.8, free T3 3.8; cholesterol 239, triglycerides 65, HDL 75, LDL 151; CMP normal  Labs 1/32/445/17/17: TSH 5.01, free T4 1.45, TPO  antibody <1, anti-thyroglobulin antibody <1; CMP normal, with calcium 10.0; CBC normal  Labs 04/27/15: HbA1c 5.5%; TSH 3.61, free T4 1.4, free T3 2.2; cholesterol 258, triglycerides 96, HDL 77, LDL 162; PTH 32, calcium 10.0, 25-OH vitamin d 33  Labs 01/12/15: TSH 0.885, free T4 1.88, free T3 3.01; CBC: Normal except MCV 98; CMP normal; vitamin B12 (344 (normal 239-931), folic acid 19.7 (normal >5.4); PTH 80.49 (normal 12-72), calcium 9.10 (normal 8.5-10.6), 25-OH vitamin D 32.6; cholesterol 231, triglycerides 83, HDL 66, LDL 148  Labs 09/24/14: Ordered, but not performed  Labs 05/21/14: TSH 0.490, free T4 1.46, free T3 2.4; HbA1C 5.8%; calcium 9.3, PTH 52; 25-OH vitamin D 40; cholesterol 246, triglycerides 92, HDL 70, LDL 176; calcitonin <2  Labs 02/09/14: TSH 2.96, free T4 1.43, free T3 2.2  Labs 02/05/14: Urinalysis: large blood, 15 ketones; alkaline phosphatase 108 (normal 32-92);   Labs 02/02/14: Urinalysis: trace blood, no ketones; Hgb 13.6, Hct 43.9, MCV 100.3; CMP normal, with alkaline phosphatase 16 (normal 40-129); TSH 3.03; B12 305 (211-946); cholesterol 282, triglycerides 68, HDL 79, LDL 189; 25-hydroxy vitamin D >60  Labs 07/11/13: B12 236 (normal 211-911), calcitonin < 2 (normal < 6)  Labs 03/10/13: CMP normal. TSH 1.456, free T4 1.36, free T3 2.6; cholesterol 194, triglycerides 82, HDL 72, LDL 106  Labs 11/18/13: TSH 0.539, free T4 1.30, free T3 2.6  Labs  10/16/12: CMP normal; TSH 2.20, free T4 1.10, free T3 2.2; cholesterol 202, triglycerides 77, HDL 82, LDL 105  Labs 06/18/12: TSH 1.260, free T4 1.58, free T3 2.5, cholesterol 211, triglycerides 71, HDL 78, LDL 119  Labs 02/13/12: TSH 0.235, free T4 1.83, cholesterol 236, triglycerides 85, HDL 68, LDL 151. Had been back on Crestor for about 3 weeks, but was off it for about 7 days prior to the labs being drawn.   Labs 01/09/12: AST 45, ALT 70, cholesterol 298, triglycerides 85, HDL 72, LDL 209, TSH 0.44, free T4 1.52, 25-hydroxy,  vitamin D 41. Had been off Crestor for months.   Labs 09/06/11: TSH 0.663, free T4 1.65, T3 117, cholesterol 225, triglycerides 65, HDL 84, LDL 128  Labs 05/09/11: TSH 1.610. Free T4: 1.66. CMP is normal. Cholesterol 214, triglycerides 67, HDL was 73, LDL 128. 25-OH-vitamin D 41.7. She started Zetia as of  05/15/11.  ASSESSMENT: 1. Hypothyroid, s/p I-131:   A. Her TFTs in August 2018 were normal on her Synthroid dose of 137 mcg/day for 6 days each week.    C. In February 2019 she was mildly hyperthyroid on that same dose. After changing the dose to 112 mcg/day, her TFTS on 08/02/17 and on 12/10/17 were mid-normal. Her TFTs were again mid-normal on 05/07/18.  2. Goiter: Thyroid gland was again within normal limits for size at he March visit. .   3. Thyroiditis: Hashimoto's disease is clinically quiescent again today. The non-physiologic shifts in her TFTs at times are c/w intermittent Hashimoto's Dz activity. 4. Prediabetes/Elevated HbA1c: Her HbA1c was up into the prediabetes zone in August 2018, but has decreased back into the normal range since then. Her HbA1c was at the upper end of the normal range on 05/07/18.  5. Obesity: Her weight has increased slightly during the covid-19 quarantine. Getting out with her dog also helps.  6. Hypertension: SBP is good today. DBP is normal, but higher that it was when she walking more. She still needs daily exercise.  7.  Hypercholesterolemia: The patient's total cholesterol and LDL in March 2020 were better  than in October 2019, but still too high. We discussed the possible use of injectable PCSK9 inhibitor medications at her last visit, but she was not in favor of that option . She needs to lose weight.   8. Dyspepsia/GERD: She was diagnosed with a gastric ulcer in October 2018. She did not have any ulcer symptoms. She was taking omeprazole. She feels that her reflux symptoms have improved since starting rabeprazole.    9. Tobacco: At this point in time she is not motivated to stop smoking. I again asked her to stop smoking. She again said that she wants to, but it's hard. I again reminded her that she has been able to recover fairly well from her previous stroke(s). She may not be so lucky the next time. If she wants to preserve her brain tissue and her abilities to be active and to live  Independently, she needs to stop smoking.  10. Edema: She had no edema at her last visit, but has had edema frequently since then.   11. 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 When she was on the diet her diarrhea stopped. When she went off the diet her diarrhea worsened. Her calcitonin was negative, ruling out hypercalcitoninemia as a cause of her diarrhea. Fortunately she is doing better now. She continues to be followed by GI. No obvious lesions were seen during her colonoscopy in October 2019. 12. Excess vitamin D: Her vitamin D levels in 2016, in 2017, and again in 2018 were normal. Her vitamin d level in march 2020 was low-normal.  13. S/p CVA:  Her MRI on 04/16/08 showed CNS defects in the right cerebrum. Her neurologic defects, however, were on the right side of her body. She may have had more CNS damage in her left cerebrum than had been recognized.  She seems to have recovered fairly well, but still has some right hemiparetic findings. She is doing very well emotionally and mentally.  14. Hyperparathyroidism,  secondary to inadequate calcium intake: Her PTH and calcium values in August 2018 were normal, but the calcium was at about the 25% of the normal range. Her calcium and PTH values in February 2019 were good. Her calcium and PTH in March 2020 was normal. We need to re-check these labs today.  15. Snoring: I suspect that she has OSA. She refuses to have a sleep study done.  16. Nasal congestion and associated symptoms: Her allergies have been acting up more this Summer, but Zyrtec helps.   17. Arthritis: I suspect that she has osteoarthritis. Her uric acid level was normal in may 2019.  18. Plantar fasciitis: Some of her foot pain is due to planter fasciitis. I suggested that she purchase some good insoles with arch support. I also suggested that she see a podiatrist.    19. Supraclavicular fullness: The prominence in the supraclavicular fossae has improved over time and is not evident today. It appears that the prominence was just fatty tissue.  PLAN: 1. Diagnostic: We discussed all of her lab results from her last visit.     2. Therapeutic: Continue rabeprazole, 20 mg/day. Discontinue omeprazole. Continue Synthroid dose of 112 mcg/day. Continue calcium dose of 900 mg/day. Continue the vitamin D dose of 3,000 IU/day. Continue the Centrum Silver.  Stop smoking. Take Tums as above. 3. Patient education: We discussed her GI problems, her thyroiditis and hypothyroidism, her cholesterols, her neuro issues, her edema, and her foot problems. I asked her again to stop smoking.  4.  Follow-up: Follow up visit in 4 months.  Level of Service: This visit lasted in excess of 50 minutes. More than 50% of the visit was devoted to counseling.  Molli Knock, MD, CDE Adult and Pediatric Endocrinology   This is a Pediatric Specialist E-Visit follow up consult provided via Telephone. Alveda Reasons consented to an E-Visit consult today.  Location of patient: Littie is at her home.  Location of provider:  Armanda Magic is at his office.  Patient was referred by Dr Donnita Falls  The following participants were involved in this E-Visit: Mas. Matty and Dr. Fransico Michael Chief Complain/ Reason for E-Visit today: Acquired hypothyroidism, thyroiditis, prediabetes, obesity, dyspepsia, secondary hyperparathyroidism, hypocalcemia, vitamin D deficiency/excess, pedal edema, s/p CVA Total time on call: 40 minutes Follow up: 4 months

## 2019-01-07 ENCOUNTER — Other Ambulatory Visit (INDEPENDENT_AMBULATORY_CARE_PROVIDER_SITE_OTHER): Payer: Self-pay | Admitting: "Endocrinology

## 2019-01-14 ENCOUNTER — Encounter (INDEPENDENT_AMBULATORY_CARE_PROVIDER_SITE_OTHER): Payer: Self-pay | Admitting: "Endocrinology

## 2019-01-14 ENCOUNTER — Ambulatory Visit (INDEPENDENT_AMBULATORY_CARE_PROVIDER_SITE_OTHER): Payer: Medicare Other | Admitting: "Endocrinology

## 2019-01-14 DIAGNOSIS — R7303 Prediabetes: Secondary | ICD-10-CM | POA: Diagnosis not present

## 2019-01-14 DIAGNOSIS — E890 Postprocedural hypothyroidism: Secondary | ICD-10-CM

## 2019-01-14 DIAGNOSIS — E673 Hypervitaminosis D: Secondary | ICD-10-CM

## 2019-01-14 DIAGNOSIS — E063 Autoimmune thyroiditis: Secondary | ICD-10-CM | POA: Diagnosis not present

## 2019-01-14 DIAGNOSIS — E211 Secondary hyperparathyroidism, not elsewhere classified: Secondary | ICD-10-CM

## 2019-01-14 DIAGNOSIS — E7800 Pure hypercholesterolemia, unspecified: Secondary | ICD-10-CM

## 2019-01-14 DIAGNOSIS — K219 Gastro-esophageal reflux disease without esophagitis: Secondary | ICD-10-CM

## 2019-01-14 NOTE — Patient Instructions (Signed)
Follow up visit in 4 months.  

## 2019-01-14 NOTE — Progress Notes (Signed)
CC: [name] [name] presents at this televisit today for follow-up hypothyroid s/p I-131 treatment, hypertension, goiter, dyspepsia, fatigue, GERD, Hashimoto's disease, status post CVA, partial right hemiparesis, hyperlipidemia, smoking, prediabetes, and supraclavicular fullness    History of present illness: [name] [name] is a 58 year old Caucasian woman.   1. [name] [name] first presented to me on 08/10/04 in referral from her primary care provider, Dr. Lonzo Cloud in [place], [place], for evaluation and management of hyperthyroidism. In retrospect, the patient had chronic problems with infected sinuses, inner ear symptoms, and spinning dizziness. She also had complaints of severe fatigue and weight gain. Dr. Raynelle Jan had performed thyroid tests on 06/14/04. TSH was 0.13, free T4 1.04. Thyroid ultrasound showed small and large nodules in a thyroid gland that was somewhat enlarged overall. On examination she had a 20+ gram thyroid gland. Thyroid gland was firm, but nontender. She also had some diastolic hypertension. Thyroid tests showed a TSH of 0.437, free T4 1.01, and free T3 of 2.6. Her TPO antibody level was 29 9. Her TSI level was 98. It appeared at that time that the patient likely had some elevation of thyroid hormone due to Hashimoto's disease or had a combination of intermittently active Hashimoto's Disease and intermittently active  Graves' Disease.   2. During the next 2 years, her TSH values varied from 0.350-0.854. By February of 2008, however, her TSH decreased to 0.008, her free T4 increased to 2.08, and her free T3 increased to 5.8. She was clearly hyperthyroid and thyrotoxic. TSI level at that point was 1.8 with normal being 1.3 or less. On 05/25/06 she underwent radioactive iodine treatment with I-131. She subsequently became hypothyroid and was started on Synthroid replacement therapy. Since then her doses of Synthroid have been adjusted several times.   3. During the past 12 years the patient has had  several other medical problems:  A. She has had one or more CVAs/TIAs, which have resulted in some mild right hemiparesis and some transient memory losses. The MRI of her brain in February 2010 showed evidence for remote infarcts involving the posterior right temporal lobe and parietal lobe.    B. She also had a 3-4 month episode of spontaneous galactorrhea in 2010. Brain MRI showed a 2 x 3 mm area of delayed enhancement seen posteriorly on the left side of her pituitary gland. I felt at the time that it would be very uncharacteristic for a true prolactinoma this small to actually produce hyperprolactinemia and clinical galactorrhea. The galactorrhea subsequently resolved spontaneously.   C. On 08/02/2010 she presented with increased prominence of both supraclavicular areas. A CT scan was performed which showed no significant pathology. She was evaluated by Dr. Melida Quitter of [facility]. Dr. Redmond Baseman could not identify any cause of the supraclavicular area prominence other than obesity. Since then, that prominence has waxed and waned with her changes in weight.    D. The patient was started on Crestor for hyperlipidemia, but initially could not tolerate even the 5 mg dose more than twice per week. She was subsequently started on Zetia, 10 mg/day, which she tolerated better, but did not control her lipids as well. Crestor was later re-started at a lower dose and she was able to tolerate that medication. However, she later discontinued the Crestor due to symptoms.   E. She has had multiple GI complaints over time, to include severe, chronic diarrhea, heartburn, and abdominal pains. In the Summer of 2015 she saw a gastroenterologist who performed a colonoscopy and removed a  polyp. He also put her on a gluten-free and casein-free diet. Her symptoms improved on that diet, but she has not been consistent with that diet for quite some time.    F. The patient has been a smoker for many years. She successfully  stopped smoking by using Chantix several years ago, but later resumed smoking. She has been very reluctant to try to stop smoking again.   3. The patient's last televisit was on 09/12/18. I continued her Synthroid dose of 112 mcg/day, her calcium dose of 900 mg/day, her vitamin D dose of 3,000 IU/day, her Centrum Silver once a day, and rabeprazole, 20 mg, daily.   A. She has been healthy. She is socially distancing.   B. She had ingrown toenail surgery in late September and again in November. She still has episodic stabbing pains in her feet. She also gets cramps in her legs. Both of these pains tend to occur about 3 AM. These pains sometimes limit her willingness and ability to walk. Her knees have not been too bad. She has not had much ankle edema at times. She is not eating out at restaurants.    C. She takes rabeprazole only as needed.    D. She continues to have intermittent diarrhea, some days better and some days worse. She is doing better at following her gluten-free diet.     E. She also has fairly consistent joint and soft tissue aches and pains. She has been having her usual low back pains.  F. She still has one kidney stone, but has not had any further episodes of passing stone fragments.   G. She has not had any further stroke symptoms. Her neurologist continued her on Plavix, 75 mg/day.    H. She take Tums, Centrum Silver, calcium 900 mg, and 3,000 IU of vitamin D once daily.   I. She is still smoking.   J. She still has not had her sleep apnea evaluation because she does not want to wear a C-pap device like her mother wore many years ago.   K. Her allergies have not been acting up recently. She has a new puppy that is "spoiled rotten".      L. She is doing well emotionally. "The dog keeps me laughing."  M. She is still doing well cognitively. She again responded with her classic answer, "'I'm not eating checkers yet."   3. Pertinent Review of Systems: Constitutional: She says, "I'm  feeling pretty good"  She still gets tired if she tries to do too much. " She still snores "like a freight train" and still frequently wakes up choking. Eyes: Vision is good with her glasses. She had an eye exam in October 2020. She will have follow up exam in December.  Neck: She continues to have intermittent, infrequent problems with swallowing. 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 She has no complaints of anterior neck swelling, soreness, tenderness, pressure, or discomfort. She has not had any recent  trapezius spasm and pinching of the cervical nerves.   Heart: She has not had any problems with fast heart rate, chest pain, or chest pressure. 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 Her cardiologist confirmed that diagnosis at her follow up appointment in September or October 2016. The patient has no other complaints of irregular heat beats, chest pain, or chest pressure.  Gastrointestinal: She still has frequent, intermittent diarrhea.  Her GERD and heartburn are better when she avoids spicy foods and takes her rabeprazole.   Legs: As above. She still occasionally has the sensation of something crawling on her skin in the upper right thigh and discomfort of her right patella. Muscle mass and strength seem unchanged. Her right side remains somewhat weaker than her left. She still notes some burning of her calves at times. She occasionally has pedal edema, but less now.  Feet: As above. She sometimes has numbness, burning, and "tingling" in her feet, more often on the right foot. She notes some edema at times. Her right leg has become stronger over time, so she only rarely drags her right foot anymore.  Neuro: She has persistent bilateral numbness in her thumbs and first two fingers bilaterally.  Mental/psychological: Her abilities to think, to pay attention, to remember, and to make decisions are good. "I ain't  eating checkers." Skin: She has several new moles. Her PCP is setting up a derm appointment.  REVIEW OF SYSTEMS: There are no other significant problems involving her other body systems.  PAST MEDICAL, FAMILY, AND SOCIAL HISTORY:  1. Family: She still lives alone. She has frequent social interactions with friends and family. She has two sisters with hypothyroidism. Her daughter is also having thyroid problems. 2. Activities: She has not been very physically active, but she does walk more with her puppy.   3. Tobacco: She is still smoking.   4. Primary care provider: Dr. Vivia EwingWendy Barker, [facility] Circlenee Rodriguez, [place] in [place].  5. Neurologist: Dr. Lacey JensenVictor Owusu, [facility], phone [phone] 6. Cardiologist: Dr. Marny LowensteinGhandi in [place]. She has not yet found a new cardiologist in [place].   PHYSICAL EXAM:  Her weight at home is 199 pounds with clothes on, up from 196 pounds at her last visit. She is alert, bright, and upbeat. Her affect and insight are very good.    LABS:   Labs 05/13/18; PTH 35, calcium 9.7, 25-OH vitamin D 31  Labs 05/07/18: HbA1c 5.6%; TSH 1.02, free T4 1.58, free T3 2.7; CMP normal with calcium 9.5; CBC normal; cholesterol 239, triglycerides 110, HDL 75, LDL 142;   Labs 12/13/17: [id number] 5.5%, CBG 91  Labs 12/10/17: TSH 1.23, free T4 1.39, free T3 2.5; CMP normal; cholesterol 254, triglycerides 96, HDL 71, LDL 163  Labs 08/08/17: HbA1c 5.4%, CBG 94  Labs 08/02/17: TSH 1.17, free T4 1.28, free T3 2.3; uric acid 6.4 (ref 2.5-7.0)  Labs 05/08/17: HbA1c 5.6%, CBG 88  Labs 04/19/17: TSH 0.453, free T4 1.61, free T3 2.7; Calcium 9.8, PTH 47; C-peptide 3.3  Labs 11/01/16: HbA1c 5.7%, CBG 84; TSH 1.23, free T4 1.5, free T3 2.7; PTH 54 (ref 14-64), calcium 9.1, 25-OH vitamin D 31  Labs 08/01/16: HbA1c 5.5%; TSH 7.10, free T4 1.0. Free T3 2.2; cholesterol 283, triglycerides 101, HDL 83, LDL 180  Labs 07/06/16: CBC normal, except RBC 3.96 (ref 4.04-5.48) and MCH 32.3 (ref  27-31.2), CMP normal  Labs 04/25/16: HbA1c 5.3%, CBG 105  Labs 01/21/16: HbA1c 5.8%; calcium 9.4, PTH 52, 25-OH vitamin D 35; TSH 0.01, free T4 1.8, free T3 3.8; cholesterol 239, triglycerides 65, HDL 75, LDL 151; CMP normal  Labs 9/14/785/17/17: TSH 5.01, free T4 1.45, TPO antibody <1, anti-thyroglobulin antibody <1; CMP normal, with calcium 10.0; CBC normal  Labs 04/27/15: HbA1c 5.5%; TSH 3.61, free T4 1.4, free T3 2.2; cholesterol 258, triglycerides 96, HDL 77, LDL 162; PTH 32, calcium 10.0, 25-OH vitamin d 33  Labs 01/12/15: TSH 0.885,  free T4 1.88, free T3 3.01; CBC: Normal except MCV 98; CMP normal; vitamin B12 (344 (normal 509-326), folic acid 71.2 (normal >5.4); PTH 80.49 (normal 12-72), calcium 9.10 (normal 8.5-10.6), 25-OH vitamin D 32.6; cholesterol 231, triglycerides 83, HDL 66, LDL 148  Labs 09/24/14: Ordered, but not performed  Labs 05/21/14: TSH 0.490, free T4 1.46, free T3 2.4; HbA1C 5.8%; calcium 9.3, PTH 52; 25-OH vitamin D 40; cholesterol 246, triglycerides 92, HDL 70, LDL 176; calcitonin <2  Labs 02/09/14: TSH 2.96, free T4 1.43, free T3 2.2  Labs 02/05/14: Urinalysis: large blood, 15 ketones; alkaline phosphatase 108 (normal 32-92);   Labs 02/02/14: Urinalysis: trace blood, no ketones; Hgb 13.6, Hct 43.9, MCV 100.3; CMP normal, with alkaline phosphatase 16 (normal 40-129); TSH 3.03; B12 305 (211-946); cholesterol 282, triglycerides 68, HDL 79, LDL 189; 25-hydroxy vitamin D >60  Labs 07/11/13: B12 236 (normal 211-911), calcitonin < 2 (normal < 6)  Labs 03/10/13: CMP normal. TSH 1.456, free T4 1.36, free T3 2.6; cholesterol 194, triglycerides 82, HDL 72, LDL 106  Labs 11/18/13: TSH 0.539, free T4 1.30, free T3 2.6  Labs 10/16/12: CMP normal; TSH 2.20, free T4 1.10, free T3 2.2; cholesterol 202, triglycerides 77, HDL 82, LDL 105  Labs 06/18/12: TSH 1.260, free T4 1.58, free T3 2.5, cholesterol 211, triglycerides 71, HDL 78, LDL 119  Labs 02/13/12: TSH 0.235, free T4 1.83,  cholesterol 236, triglycerides 85, HDL 68, LDL 151. Had been back on Crestor for about 3 weeks, but was off it for about 7 days prior to the labs being drawn.   Labs 01/09/12: AST 45, ALT 70, cholesterol 298, triglycerides 85, HDL 72, LDL 209, TSH 0.44, free T4 1.52, 25-hydroxy,  vitamin D 41. Had been off Crestor for months.   Labs 09/06/11: TSH 0.663, free T4 1.65, T3 117, cholesterol 225, triglycerides 65, HDL 84, LDL 128  Labs 05/09/11: TSH 1.700. Free T4: 1.66. CMP is normal. Cholesterol 214, triglycerides 67, HDL was 73, LDL 128. 25-OH-vitamin D 41.7. She started Zetia as of  05/15/11.  ASSESSMENT: 1. Hypothyroid, s/p I-131:   A. Her TFTs in August 2018 were normal on her Synthroid dose of 137 mcg/day for 6 days each week.    C. In February 2019 she was mildly hyperthyroid on that same dose. After changing the dose to 112 mcg/day, her TFTS on 08/02/17 and on 12/10/17 were mid-normal. Her TFTs were again mid-normal on 05/07/18.  2. Goiter: Thyroid gland was again within normal limits for size at he March visit. .   3. Thyroiditis: Hashimoto's disease is clinically quiescent again today. The non-physiologic shifts in her TFTs at times are c/w intermittent Hashimoto's Dz activity. 4. Prediabetes/Elevated HbA1c: Her HbA1c was up into the prediabetes zone in August 2018, but has decreased back into the normal range since then. Her HbA1c was at the upper end of the normal range on 05/07/18.  5. Obesity: Her weight has increased slightly during the covid-19 quarantine. Getting out with her dog helps.  6. Hypertension: She still needs daily exercise.  7. Hypercholesterolemia: The patient's total cholesterol and LDL in March 2020 were better than in October 2019, but still too high. We discussed the possible use of injectable PCSK9 inhibitor medications at her last visit, but she was not in favor of that option . She needs to lose weight.   8. Dyspepsia/GERD: She was diagnosed with a gastric ulcer in October  2018. She did not have any ulcer symptoms. She was taking omeprazole. She  feels that her reflux symptoms have improved since starting rabeprazole.    9. Tobacco: At this point in time she is not motivated to stop smoking. I again asked her to stop smoking. She again said that she wants to, but it's hard. I again reminded her that she has been able to recover fairly well from her previous stroke(s). She may not be so lucky the next time. If she wants to preserve her brain tissue and her abilities to be active and to live  Independently, she needs to stop smoking.  10. Edema: She had no edema at her last visit, but has had edema frequently since then.   11. 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 When she was on the diet her diarrhea stopped. When she went off the diet her diarrhea worsened. Her calcitonin was negative, ruling out hypercalcitoninemia as a cause of her diarrhea. Fortunately she is doing better now. She continues to be followed by GI. No obvious lesions were seen during her colonoscopy in October 2019. 12. Excess vitamin D: Her vitamin D levels in 2016, in 2017, and again in 2018 were normal. Her vitamin D level in march 2020 was low-normal.  13. S/p CVA:  Her MRI on 04/16/08 showed CNS defects in the right cerebrum. Her neurologic defects, however, were on the right side of her body. She may have had more CNS damage in her left cerebrum than had been recognized.  She seems to have recovered fairly well, but still has some right hemiparetic findings. She is doing very well emotionally and mentally.  14. Hyperparathyroidism, secondary to inadequate calcium intake: Her PTH and calcium values in August 2018 were normal, but the calcium was at about the 25% of the normal range. Her calcium and PTH values in February 2019 were good. Her calcium and PTH in March 2020 was normal. We need to re-check these labs.  15. Snoring: I suspect that she has OSA. She refuses to have a sleep study  done.  16. Nasal congestion and associated symptoms: Her allergies have been acting up more this Summer, but Zyrtec helps.   17. Arthritis: I suspect that she has osteoarthritis. Her uric acid level was normal in may 2019.  18. Plantar fasciitis: Some of her foot pain is due to planter fasciitis. I suggested that she purchase some good insoles with arch support. I also suggested that she see a podiatrist.    19. Supraclavicular fullness: The prominence in the supraclavicular fossae has improved over time and was not evident at her last clinic visit. It appears that the prominence was just fatty tissue.  PLAN: 1. Diagnostic: I ordered TFTs, HbA1c, lipid panel, vitamin D, PTH, calcium.     2. Therapeutic: Continue rabeprazole, 20 mg/day. Continue Synthroid dose of 112 mcg/day. Continue calcium dose of 900 mg/day. Continue the vitamin D dose of 3,000 IU/day. Continue the Centrum Silver.  Stop smoking. Take Tums as above. 3. Patient education: We discussed her GI problems, her thyroiditis and hypothyroidism, her cholesterols, her neuro issues, her edema, and her foot problems. I asked her again to stop smoking.  4. Follow-up: Follow up visit in 4 months.  Level of Service: This visit lasted in excess of 45 minutes. More than 50% of the visit was devoted to counseling.  Molli Knock, MD, CDE Adult and Pediatric Endocrinology   This is a Pediatric Specialist E-Visit follow up consult provided via Telephone. Alveda Reasons consented to an E-Visit consult today.  Location  of patient: Emillie is at her home.  Location of provider: Armanda Magic is at his office.  Patient was referred by Dr Michell Heinrich  The following participants were involved in this E-Visit: [name]. Thomasena Edis and Dr. Fransico  Chief Complain/ Reason for E-Visit today: Acquired hypothyroidism, thyroiditis, prediabetes, obesity, dyspepsia, secondary hyperparathyroidism, hypocalcemia, vitamin D deficiency/excess, pedal  edema, s/p CVA Total time on call: 35 minutes Follow up: 4 months

## 2019-02-04 HISTORY — PX: APPENDECTOMY: SHX54

## 2019-02-25 DIAGNOSIS — Z9049 Acquired absence of other specified parts of digestive tract: Secondary | ICD-10-CM | POA: Insufficient documentation

## 2019-02-25 DIAGNOSIS — K3532 Acute appendicitis with perforation and localized peritonitis, without abscess: Secondary | ICD-10-CM | POA: Insufficient documentation

## 2019-05-14 ENCOUNTER — Ambulatory Visit (INDEPENDENT_AMBULATORY_CARE_PROVIDER_SITE_OTHER): Payer: Medicare PPO | Admitting: "Endocrinology

## 2019-05-14 ENCOUNTER — Other Ambulatory Visit: Payer: Self-pay

## 2019-05-14 ENCOUNTER — Encounter (INDEPENDENT_AMBULATORY_CARE_PROVIDER_SITE_OTHER): Payer: Self-pay | Admitting: "Endocrinology

## 2019-05-14 DIAGNOSIS — I10 Essential (primary) hypertension: Secondary | ICD-10-CM

## 2019-05-14 DIAGNOSIS — E063 Autoimmune thyroiditis: Secondary | ICD-10-CM

## 2019-05-14 DIAGNOSIS — E559 Vitamin D deficiency, unspecified: Secondary | ICD-10-CM

## 2019-05-14 DIAGNOSIS — E7800 Pure hypercholesterolemia, unspecified: Secondary | ICD-10-CM | POA: Diagnosis not present

## 2019-05-14 DIAGNOSIS — E890 Postprocedural hypothyroidism: Secondary | ICD-10-CM

## 2019-05-14 DIAGNOSIS — R7303 Prediabetes: Secondary | ICD-10-CM

## 2019-05-14 NOTE — Progress Notes (Signed)
CC: [name]. Oloughlin presents at this televisit today for follow-up hypothyroid s/p I-131 treatment, hypertension, goiter, dyspepsia, fatigue, GERD, Hashimoto's disease, status post CVA, partial right hemiparesis, hyperlipidemia, smoking, prediabetes, and supraclavicular fullness    History of present illness: [name] [name] is a 59 year old Caucasian woman.   1. [name] [name] first presented to me on 08/10/04 in referral from her primary care provider, Dr. Lonzo Cloud in [place], [place], for evaluation and management of hyperthyroidism. In retrospect, the patient had chronic problems with infected sinuses, inner ear symptoms, and spinning dizziness. She also had complaints of severe fatigue and weight gain. Dr. Raynelle Jan had performed thyroid tests on 06/14/04. TSH was 0.13, free T4 1.04. Thyroid ultrasound showed small and large nodules in a thyroid gland that was somewhat enlarged overall. On examination she had a 20+ gram thyroid gland. Thyroid gland was firm, but nontender. She also had some diastolic hypertension. Thyroid tests showed a TSH of 0.437, free T4 1.01, and free T3 of 2.6. Her TPO antibody level was 29 9. Her TSI level was 98. It appeared at that time that the patient likely had some elevation of thyroid hormone due to Hashimoto's disease or had a combination of intermittently active Hashimoto's Disease and intermittently active  Graves' Disease.   2. During the next 2 years, her TSH values varied from 0.350-0.854. By February of 2008, however, her TSH decreased to 0.008, her free T4 increased to 2.08, and her free T3 increased to 5.8. She was clearly hyperthyroid and thyrotoxic. TSI level at that point was 1.8 with normal being 1.3 or less. On 05/25/06 she underwent radioactive iodine treatment with I-131. She subsequently became hypothyroid and was started on Synthroid replacement therapy. Since then her doses of Synthroid have been adjusted several times.   3. During the past 12 years the patient has had  several other medical problems:  A. She has had one or more CVAs/TIAs, which have resulted in some mild right hemiparesis and some transient memory losses. The MRI of her brain in February 2010 showed evidence for remote infarcts involving the posterior right temporal lobe and parietal lobe.    B. She also had a 3-4 month episode of spontaneous galactorrhea in 2010. Brain MRI showed a 2 x 3 mm area of delayed enhancement seen posteriorly on the left side of her pituitary gland. I felt at the time that it would be very uncharacteristic for a true prolactinoma this small to actually produce hyperprolactinemia and clinical galactorrhea. The galactorrhea subsequently resolved spontaneously.   C. On 08/02/2010 she presented with increased prominence of both supraclavicular areas. A CT scan was performed which showed no significant pathology. She was evaluated by Dr. Melida Quitter of [facility]. Dr. Redmond Baseman could not identify any cause of the supraclavicular area prominence other than obesity. Since then, that prominence has waxed and waned with her changes in weight.    D. The patient was started on Crestor for hyperlipidemia, but initially could not tolerate even the 5 mg dose more than twice per week. She was subsequently started on Zetia, 10 mg/day, which she tolerated better, but did not control her lipids as well. Crestor was later re-started at a lower dose and she was able to tolerate that medication. However, she later discontinued the Crestor due to symptoms.   E. She has had multiple GI complaints over time, to include severe, chronic diarrhea, heartburn, and abdominal pains. In the Summer of 2015 she saw a gastroenterologist who performed a colonoscopy and removed a  polyp. He also put her on a gluten-free and casein-free diet. Her symptoms improved on that diet, but she has not been consistent with that diet for quite some time.    F. The patient has been a smoker for many years. She successfully  stopped smoking by using Chantix several years ago, but later resumed smoking. She has been very reluctant to try to stop smoking again.   G. She had ingrown toenail surgery in late September and again in November 2020.  3. The patient's last televisit was on 01/14/19. I continued her Synthroid dose of 112 mcg/day, her calcium dose of 900 mg/day, her vitamin D dose of 3,000 IU/day, her Centrum Silver once a day, and rabeprazole, 20 mg, daily.   A. She had been healthy until 02/12/19 when she developed acute appendicitis. She had an appendectomy at [facility] in [place] (the former [facility] She was hospitalized for 10 days. She also developed pleurisy and shortness of breath. She is being followed by a doctor in [place], [place]. She stopped smoking.   B. Her CT scan showed her kidney stone was larger and she has blood in her urine. She will be followed by a nephrologist in [place].     C. She refuses to get her covid vaccine. She is socially distancing.   D. She still has episodic stabbing pains in her feet. She also gets cramps in her legs. Both of these pains tend to occur about 3 AM. These pains sometimes limit her willingness and ability to walk. Her knees have not been too bad. She has not had much ankle edema at times.   E. She takes rabeprazole only as needed for heartburn.    E. She continues to have intermittent diarrhea, some days better and some days worse. She is pretty good at following her gluten-free diet.     F. She also has fairly consistent joint and soft tissue aches and pains. She has been having her usual low back pains.  G. She has not had any further stroke symptoms. Her neurologist continued her on Plavix, 75 mg/day.    H. She take Tums, Centrum Silver, calcium 900 mg, and 3,000 IU of vitamin D once daily.   I. She still has not had her sleep apnea evaluation because she does not want to wear a C-pap device like her mother wore many years ago.   J. Her allergies have not  been acting up recently, but will be soon.  She has one dog and 4 puppies.       K. She is doing well emotionally.   L. She is also doing well cognitively. She again responded with her classic answer, "'I'm fine. I'm not eating checkers yet."   4. Pertinent Review of Systems: Constitutional: She says, "I'm feeling pretty good today, except for my aches, pains, and IBS."  She still gets worn out if she tries to do too much. She still snores "like a freight train" and still frequently wakes up choking. Eyes: Vision is good with her glasses. She had an eye exam in October 2020. She had to cancel her appointment in December due to her surgery.  Neck:  She has no complaints of anterior neck swelling, soreness, tenderness, pressure, or discomfort. She has not had any recent  trapezius spasm and pinching of the cervical nerves.   Heart: She has not had any problems with fast heart rate, chest pain, or chest pressure. 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 Her cardiologist confirmed that diagnosis at her follow up appointment in September or October 2016. The patient has no other complaints of irregular heat beats, chest pain, or chest pressure.  Gastrointestinal: She continues to have intermittent, but infrequent, problems with swallowing. She was told in about 2010 or 2011 that her UGI showed a spastic esophagus.She still has frequent, intermittent diarrhea. Her GERD and heartburn are better when she avoids spicy foods and takes her rabeprazole.   Legs: As above. She still occasionally has the sensation of something crawling on her skin in the upper right thigh and discomfort of her right patella. Muscle mass and strength seem unchanged. Her right side remains somewhat weaker than her left. She still notes some burning of her calves at times. She occasionally has pedal edema, but less now.  Feet: As above. She sometimes has numbness, burning, and "tingling"  in her feet, more often on the right foot. She notes some edema at times. Her right leg has become stronger over time, so she only rarely drags her right foot anymore.  Neuro: She has persistent bilateral numbness in her thumbs and first two fingers bilaterally.  Mental/psychological: Her abilities to think, to pay attention, to remember, and to make decisions are good. "I ain't eating checkers." Skin: She has several new moles. Her dermatologist told her that they were nothing to worry about.   REVIEW OF SYSTEMS: There are no other significant problems involving her other body systems.  PAST MEDICAL, FAMILY, AND SOCIAL HISTORY:  1. Family: She still lives alone. She has frequent social interactions with friends and family. She has two sisters with hypothyroidism. Her daughter is also having thyroid problems. 2. Activities: She has not been very physically active, especially since her recent surgery.    3. Tobacco: She stopped smoking.   4. Primary care provider: Dr. Vivia Ewing, [address], [place] in [place].  5. Neurologist: Dr. Lacey Jensen, [facility], phone [phone] 6. Cardiologist: Dr. Marny Lowenstein in [place]. She has not yet found a new cardiologist in [place].   PHYSICAL EXAM:  Her weight at home was 203 pounds with clothes on, up from 19 pounds at her last televisit in December. She is alert, bright, and upbeat. Her affect and insight are very good.    LABS:   Labs 05/13/18; PTH 35, calcium 9.7, 25-OH vitamin D 31  Labs 05/07/18: HbA1c 5.6%; TSH 1.02, free T4 1.58, free T3 2.7; CMP normal with calcium 9.5; CBC normal; cholesterol 239, triglycerides 110, HDL 75, LDL 142;   Labs 12/13/17: JJK0X 5.5%, CBG 91  Labs 12/10/17: TSH 1.23, free T4 1.39, free T3 2.5; CMP normal; cholesterol 254, triglycerides 96, HDL 71, LDL 163  Labs 08/08/17: HbA1c 5.4%, CBG 94  Labs 08/02/17: TSH 1.17, free T4 1.28, free T3 2.3; uric acid 6.4 (ref 2.5-7.0)  Labs 05/08/17: HbA1c 5.6%,  CBG 88  Labs 04/19/17: TSH 0.453, free T4 1.61, free T3 2.7; Calcium 9.8, PTH 47; C-peptide 3.3  Labs 11/01/16: HbA1c 5.7%, CBG 84; TSH 1.23, free T4 1.5, free T3 2.7; PTH 54 (ref 14-64), calcium 9.1, 25-OH vitamin D 31  Labs 08/01/16: HbA1c 5.5%; TSH 7.10, free T4 1.0. Free T3 2.2; cholesterol 283, triglycerides 101, HDL 83, LDL 180  Labs 07/06/16: CBC normal, except RBC 3.96 (ref 4.04-5.48) and MCH 32.3 (ref 27-31.2), CMP normal  Labs 04/25/16: HbA1c 5.3%, CBG 105  Labs 01/21/16: HbA1c 5.8%; calcium 9.4, PTH 52, 25-OH vitamin  D 35; TSH 0.01, free T4 1.8, free T3 3.8; cholesterol 239, triglycerides 65, HDL 75, LDL 151; CMP normal  Labs 8/29/565/17/17: TSH 5.01, free T4 1.45, TPO antibody <1, anti-thyroglobulin antibody <1; CMP normal, with calcium 10.0; CBC normal  Labs 04/27/15: HbA1c 5.5%; TSH 3.61, free T4 1.4, free T3 2.2; cholesterol 258, triglycerides 96, HDL 77, LDL 162; PTH 32, calcium 10.0, 25-OH vitamin d 33  Labs 01/12/15: TSH 0.885, free T4 1.88, free T3 3.01; CBC: Normal except MCV 98; CMP normal; vitamin B12 (344 (normal 239-931), folic acid 19.7 (normal >5.4); PTH 80.49 (normal 12-72), calcium 9.10 (normal 8.5-10.6), 25-OH vitamin D 32.6; cholesterol 231, triglycerides 83, HDL 66, LDL 148  Labs 09/24/14: Ordered, but not performed  Labs 05/21/14: TSH 0.490, free T4 1.46, free T3 2.4; HbA1C 5.8%; calcium 9.3, PTH 52; 25-OH vitamin D 40; cholesterol 246, triglycerides 92, HDL 70, LDL 176; calcitonin <2  Labs 02/09/14: TSH 2.96, free T4 1.43, free T3 2.2  Labs 02/05/14: Urinalysis: large blood, 15 ketones; alkaline phosphatase 108 (normal 32-92);   Labs 02/02/14: Urinalysis: trace blood, no ketones; Hgb 13.6, Hct 43.9, MCV 100.3; CMP normal, with alkaline phosphatase 16 (normal 40-129); TSH 3.03; B12 305 (211-946); cholesterol 282, triglycerides 68, HDL 79, LDL 189; 25-hydroxy vitamin D >60  Labs 07/11/13: B12 236 (normal 211-911), calcitonin < 2 (normal < 6)  Labs 03/10/13: CMP normal.  TSH 1.456, free T4 1.36, free T3 2.6; cholesterol 194, triglycerides 82, HDL 72, LDL 106  Labs 11/18/13: TSH 0.539, free T4 1.30, free T3 2.6  Labs 10/16/12: CMP normal; TSH 2.20, free T4 1.10, free T3 2.2; cholesterol 202, triglycerides 77, HDL 82, LDL 105  Labs 06/18/12: TSH 1.260, free T4 1.58, free T3 2.5, cholesterol 211, triglycerides 71, HDL 78, LDL 119  Labs 02/13/12: TSH 0.235, free T4 1.83, cholesterol 236, triglycerides 85, HDL 68, LDL 151. Had been back on Crestor for about 3 weeks, but was off it for about 7 days prior to the labs being drawn.   Labs 01/09/12: AST 45, ALT 70, cholesterol 298, triglycerides 85, HDL 72, LDL 209, TSH 0.44, free T4 1.52, 25-hydroxy,  vitamin D 41. Had been off Crestor for months.   Labs 09/06/11: TSH 0.663, free T4 1.65, T3 117, cholesterol 225, triglycerides 65, HDL 84, LDL 128  Labs 05/09/11: TSH 2.1301.700. Free T4: 1.66. CMP is normal. Cholesterol 214, triglycerides 67, HDL was 73, LDL 128. 25-OH-vitamin D 41.7. She started Zetia as of  05/15/11.  ASSESSMENT: 1. Hypothyroid, s/p I-131:   A. Her TFTs in August 2018 were normal on her Synthroid dose of 137 mcg/day for 6 days each week.    B. In February 2019 she was mildly hyperthyroid on that same dose. After changing the dose to 112 mcg/day, her TFTS on 08/02/17 and on 12/10/17 were mid-normal. Her TFTs were again mid-normal on 05/07/18.   C. She needs her annual TFTs done.  2. Goiter: Thyroid gland was again within normal limits for size at he March visit. .   3. Thyroiditis: Hashimoto's disease is clinically quiescent again today. The non-physiologic shifts in her TFTs at times are c/w intermittent Hashimoto's Dz activity. 4. Prediabetes/Elevated HbA1c: Her HbA1c was up into the prediabetes zone in August 2018, but has decreased back into the normal range since then. Her HbA1c was at the upper end of the normal range on 05/07/18.  5. Obesity: Her weight has increased more. She needs to be careful with her  diet.   6.  Hypertension: She still needs daily exercise.  7. Hypercholesterolemia: The patient's total cholesterol and LDL in March 2020 were better than in October 2019, but still too high. We discussed the possible use of injectable PCSK9 inhibitor medications at her last visit, but she was not in favor of that option. She needs to lose weight.   8. Dyspepsia/GERD: She was diagnosed with a gastric ulcer in October 2018. She did not have any ulcer symptoms. She was taking omeprazole. She feels that her reflux symptoms have improved since starting rabeprazole. Unfortunately, she waits to take the rabeprazole until after she already has reflux symptom.   9. Tobacco: She stopped smoking during her 10-day hospitalization.   10. Edema: She had no edema at her last visit, but has had edema at times since then.    11. 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 When she was on the diet her diarrhea stopped. When she went off the diet her diarrhea worsened. Her calcitonin was negative, ruling out hypercalcitoninemia as a cause of her diarrhea. She continues to be followed by GI. No obvious lesions were seen during her colonoscopy in October 2019. She had to cancel her planned colonoscopy in December 2020 due to her appendicitis.  12. Excess vitamin D: Her vitamin D levels in 2016, in 2017, and again in 2018 were normal. Her vitamin D level in March 2020 was low-normal.  13. S/p CVA:  Her MRI on 04/16/08 showed CNS defects in the right cerebrum. Her neurologic defects, however, were on the right side of her body. She may have had more CNS damage in her left cerebrum than had been recognized.  She seems to have recovered fairly well, but still has some right hemiparetic findings. She is doing very well emotionally and mentally.  14. Hyperparathyroidism, secondary to inadequate calcium intake: Her PTH and calcium values in August 2018 were normal, but the calcium was at about the 25% of the normal range.  Her calcium and PTH values in February 2019 were good. Her calcium and PTH in March 2020 were normal. We need to re-check these labs.  15. Snoring: I suspect that she has OSA. She refuses to have a sleep study done.  16. Nasal congestion and associated symptoms: Her allergies have been acting up more this Summer, but Zyrtec helps.   17. Arthritis: I suspect that she has osteoarthritis. Her uric acid level was normal in may 2019.  18. Plantar fasciitis: Some of her foot pain in the past has been due to planter fasciitis. I suggested that she purchase some good insoles with arch support. I also suggested that she see a podiatrist.    19. Supraclavicular fullness: The prominence in the supraclavicular fossae has improved over time and was not evident at her last clinic visit. It appears that the prominence was just fatty tissue.  PLAN: 1. Diagnostic: I ordered TFTs, HbA1c, lipid panel, vitamin D, PTH, calcium.    2. Therapeutic: Continue rabeprazole, 20 mg/day. Continue Synthroid dose of 112 mcg/day. Continue calcium dose of 900 mg/day. Continue the vitamin D dose of 3,000 IU/day. Continue the Centrum Silver.  Stop smoking. Take Tums as above. 3. Patient education: We discussed her GI problems, her thyroiditis and hypothyroidism, her cholesterols, her neuro issues, her edema, and her foot problems. 4. Follow-up: Follow up visit in 4 months.  Level of Service: This visit lasted in excess of 50 minutes. More than 50% of the visit was devoted to counseling.  Tillman Sers, MD,  CDE Adult and Pediatric Endocrinology   This is a Pediatric Specialist E-Visit follow up consult provided via Telephone. Alveda Reasons consented to an E-Visit consult today.  Location of patient: [place] is at her home.  Location of provider: Armanda Magic is at his office.  Patient was referred by Dr Michell Heinrich  The following participants were involved in this E-Visit: [name]. Thomasena Edis and Dr.  Fransico Javarion Douty Chief Complain/ Reason for E-Visit today: Acquired hypothyroidism, thyroiditis, prediabetes, obesity, dyspepsia, secondary hyperparathyroidism, hypocalcemia, vitamin D deficiency/excess, pedal edema, s/p CVA Total time on call: 38 minutes Follow up: 4 months

## 2019-05-14 NOTE — Patient Instructions (Signed)
Follow up visit in 4 months.  

## 2019-05-16 ENCOUNTER — Telehealth (INDEPENDENT_AMBULATORY_CARE_PROVIDER_SITE_OTHER): Payer: Self-pay | Admitting: "Endocrinology

## 2019-05-16 NOTE — Telephone Encounter (Signed)
Put In out going mail

## 2019-05-16 NOTE — Telephone Encounter (Signed)
  Who's calling (name and relationship to patient) : Arty Baumgartner self  Best contact number: [phone]  Provider they see: Dr. Fransico Michael  Reason for call: [name] [name] is requesting her lab orders be printed and mailed to her so she can take them with her to get labs done.     PRESCRIPTION REFILL ONLY  Name of prescription:  Pharmacy:

## 2019-07-02 LAB — T3, FREE: T3, Free: 2.7 pg/mL (ref 2.3–4.2)

## 2019-07-02 LAB — PTH, INTACT AND CALCIUM
Calcium: 9.9 mg/dL (ref 8.6–10.4)
PTH: 35 pg/mL (ref 14–64)

## 2019-07-02 LAB — LIPID PANEL
Cholesterol: 255 mg/dL — ABNORMAL HIGH (ref <200)
HDL: 86 mg/dL (ref >=50)
LDL Cholesterol (Calc): 144 mg/dL (calc) — ABNORMAL HIGH
Non-HDL Cholesterol (Calc): 169 mg/dL (calc) — ABNORMAL HIGH (ref <130)
Total CHOL/HDL Ratio: 3 (calc) (ref <5.0)
Triglycerides: 123 mg/dL (ref <150)

## 2019-07-02 LAB — VITAMIN D 25 HYDROXY (VIT D DEFICIENCY, FRACTURES): Vit D, 25-Hydroxy: 27 ng/mL — ABNORMAL LOW (ref 30–100)

## 2019-07-02 LAB — HEMOGLOBIN A1C
Hgb A1c MFr Bld: 5.8 % of total Hgb — ABNORMAL HIGH (ref <5.7)
Mean Plasma Glucose: 120 (calc)
eAG (mmol/L): 6.6 (calc)

## 2019-07-02 LAB — T4, FREE: Free T4: 1.2 ng/dL (ref 0.8–1.8)

## 2019-07-02 LAB — TSH: TSH: 3.5 mIU/L (ref 0.40–4.50)

## 2019-07-09 ENCOUNTER — Encounter (INDEPENDENT_AMBULATORY_CARE_PROVIDER_SITE_OTHER): Payer: Self-pay | Admitting: *Deleted

## 2019-07-17 ENCOUNTER — Other Ambulatory Visit (INDEPENDENT_AMBULATORY_CARE_PROVIDER_SITE_OTHER): Payer: Self-pay | Admitting: "Endocrinology

## 2019-07-17 ENCOUNTER — Telehealth (INDEPENDENT_AMBULATORY_CARE_PROVIDER_SITE_OTHER): Payer: Self-pay

## 2019-07-17 NOTE — Telephone Encounter (Signed)
Received pharmacy request for Synthroid refill.  Spoke with patient to confirm her current dose of Synthroid.  She did receive the dose change in the letter regarding her labs and has started taking the extra doses.  Refilled Synthroid request with updated changes.

## 2019-07-30 ENCOUNTER — Ambulatory Visit
Admission: RE | Admit: 2019-07-30 | Discharge: 2019-07-30 | Disposition: A | Discharge status: Home / Self Care | Payer: Medicare PPO | Source: Ambulatory Visit | Attending: Urology | Admitting: Urology

## 2019-07-30 ENCOUNTER — Ambulatory Visit: Payer: Medicare PPO | Admitting: Urology

## 2019-07-30 ENCOUNTER — Encounter: Payer: Self-pay | Admitting: Urology

## 2019-07-30 ENCOUNTER — Other Ambulatory Visit: Payer: Self-pay

## 2019-07-30 VITALS — BP 142/72 | HR 78 | Ht 61.5 in | Wt 207.0 lb

## 2019-07-30 DIAGNOSIS — R310 Gross hematuria: Secondary | ICD-10-CM

## 2019-07-30 DIAGNOSIS — N200 Calculus of kidney: Secondary | ICD-10-CM | POA: Diagnosis not present

## 2019-07-30 LAB — URINALYSIS, COMPLETE
Bilirubin, UA: NEGATIVE
Glucose, UA: NEGATIVE
Ketones, UA: NEGATIVE
Leukocytes,UA: NEGATIVE
Nitrite, UA: NEGATIVE
Protein,UA: NEGATIVE
Specific Gravity, UA: 1.01 (ref 1.005–1.030)
Urobilinogen, Ur: 0.2 mg/dL (ref 0.2–1.0)
pH, UA: 6 (ref 5.0–7.5)

## 2019-07-30 LAB — MICROSCOPIC EXAMINATION: Bacteria, UA: NONE SEEN

## 2019-07-30 NOTE — Patient Instructions (Signed)

## 2019-07-30 NOTE — Progress Notes (Signed)
07/30/2019 1:18 PM   [name] [name] [birth year] [id number]  Referring provider: Virgina Norfolk, MD [address] [place],  [place] [address]  Chief Complaint  Patient presents with  . Nephrolithiasis    HPI:  [name] [name] was referred over for kidney stones.  A CT of the L-spine 06/21/2016 revealed a 5 mm left lower pole stone.  The kidneys were well visualized. However, she has a report from [facility] - R Dec 2020 CT A/P that the LLP stone has grown to 8 mm and there are no other stones commented on the report. She is concerned and wants treatment. She has a history of kidney stones. She passed a 3 mm stone and 5 mm in the past. Today, no flank pain but she has saw red and orange urine. She wiped and saw blood on the toilet paper. She has had a hx.    PMH: Past Medical History:  Diagnosis Date  . Allergy to Coumadin    Not an allergy but pt declines Coumadin  . Diverticulitis    August, 2013  . Ejection fraction    EF normal by bedside echo per Dr. Andee [name], office visit, July, 2012  . Fatigue   . Galactorrhea syndrome   . GERD (gastroesophageal reflux disease)   . Hemiparesis, right (HCC)   . History of kidney stones   . Hypertension   . Hypothyroidism, acquired, autoimmune   . Kidney stones   . Memory problem   . Migraine headache    Versus TIAs  . Patent foramen ovale    Status post device closure  at [facility]  25 mmGore Helix occluder device  . Pre-diabetes   . Sleep apnea   . Stroke [facility])    Silent right brain stroke/cryptogenic stroke  . Thyroid disease    Hypothyroidism, post-radiation  . Thyroiditis, autoimmune   . Tobacco abuse   . Urinary calculus   . Vertigo   . Visual changes     Surgical History: Past Surgical History:  Procedure Laterality Date  . APPENDECTOMY  02/2019  . COLONOSCOPY WITH PROPOFOL N/A 01/01/2017   Procedure: COLONOSCOPY WITH PROPOFOL;  Surgeon: [name] Jun, MD;  Location: [facility] ENDOSCOPY;  Service: Endoscopy;   Laterality: N/A;  . ESOPHAGOGASTRODUODENOSCOPY (EGD) WITH PROPOFOL N/A 01/01/2017   Procedure: ESOPHAGOGASTRODUODENOSCOPY (EGD) WITH PROPOFOL;  Surgeon: [name] Jun, MD;  Location: [facility] ENDOSCOPY;  Service: Endoscopy;  Laterality: N/A;  . I-131 TREATMENT  05/25/2006  . PATENT FORAMEN OVALE(PFO) CLOSURE    . TONSILLECTOMY    . TONSILLECTOMY AND ADENOIDECTOMY    . TOTAL ABDOMINAL HYSTERECTOMY  1997   No bso    Home Medications:  Allergies as of 07/30/2019      Reactions   Crestor [rosuvastatin Calcium] Other (See Comments)   Muscle cramps.   Cymbalta [duloxetine Hcl] Diarrhea   Doxycycline    REACTION: Facial numbness, Nausea   Metronidazole    REACTION: "blood vessels come up to top of skin"      Medication List       Accurate as of Jul 30, 2019  1:18 PM. If you have any questions, ask your nurse or doctor.        carvedilol 12.5 MG tablet Commonly known as: COREG Take 12.5 mg by mouth 2 (two) times daily. What changed: Another medication with the same name was removed. Continue taking this medication, and follow the directions you see here. Changed by: [name] Field, MD   cetirizine 10 MG  tablet Commonly known as: ZYRTEC Take 10 mg by mouth daily.   Cholecalciferol 125 MCG (5000 UT) Tabs Take by mouth.   clopidogrel 75 MG tablet Commonly known as: PLAVIX Take 75 mg by mouth daily.   doxycycline 100 MG capsule Commonly known as: VIBRAMYCIN TAKE 1 CAPSULE BY MOUTH EVERY 12 HOURS FOR 10 DAYS   fexofenadine-pseudoephedrine 180-240 MG 24 hr tablet Commonly known as: ALLEGRA-D 24 1 by mouth daily prn   levothyroxine 112 MCG tablet Commonly known as: SYNTHROID Take 1 Tablet by mouth once daily before breakfast and one additional half pill, twice a week on Wednesdays and Sundays   methocarbamol 500 MG tablet Commonly known as: ROBAXIN Take 500 mg by mouth every 8 (eight) hours as needed.   Multi-Vitamins Tabs Take by mouth.   omeprazole 20 MG  capsule Commonly known as: PRILOSEC Take by mouth.   RABEprazole 20 MG tablet Commonly known as: Aciphex Take one daily.       Allergies:  Allergies  Allergen Reactions  . Crestor [Rosuvastatin Calcium] Other (See Comments)    Muscle cramps.  Marland Kitchen Cymbalta [Duloxetine Hcl] Diarrhea  . Doxycycline     REACTION: Facial numbness, Nausea  . Metronidazole     REACTION: "blood vessels come up to top of skin"    Family History: Family History  Problem Relation Age of Onset  . Stroke Other   . Hypertension Other   . Diabetes Mother   . Diabetes Father   . Thyroid disease Maternal Aunt   . Diabetes Maternal Grandmother   . Diabetes Paternal Grandmother   . Cancer Paternal Grandmother     Social History:  reports that she has quit smoking. Her smoking use included cigarettes. She has a 35.00 pack-year smoking history. She has never used smokeless tobacco. She reports current alcohol use. She reports that she does not use drugs.   Physical Exam: BP (!) 142/72 (BP Location: Left Arm, Patient Position: Sitting, Cuff Size: Large)   Pulse 78   Ht 5' 1.5" (1.562 m)   Wt 207 lb (93.9 kg)   BMI 38.48 kg/m   Constitutional:  Alert and oriented, No acute distress. HEENT: [place] AT, moist mucus membranes.  Trachea midline, no masses. Cardiovascular: No clubbing, cyanosis, or edema. Respiratory: Normal respiratory effort, no increased work of breathing. GI: Abdomen is soft, nontender, nondistended, no abdominal masses GU: No CVA tenderness Skin: No rashes, bruises or suspicious lesions. Neurologic: Grossly intact, no focal deficits, moving all 4 extremities. Psychiatric: Normal mood and affect.  Laboratory Data: Lab Results  Component Value Date   WBC 8.4 04/04/2007   HGB 11.0 POST TRANSFUSION SPECIMEN (L) 04/10/2007   HCT 31.8 (L) 04/10/2007   MCV 97.4 04/04/2007   PLT 245 04/04/2007    Lab Results  Component Value Date   CREATININE 0.81 12/10/2017    No results found for:  PSA  No results found for: TESTOSTERONE  Lab Results  Component Value Date   HGBA1C 5.8 (H) 07/01/2019    Urinalysis    Component Value Date/Time   COLORURINE YELLOW 04/04/2007 1504   APPEARANCEUR CLEAR 04/04/2007 1504   LABSPEC 1.014 04/04/2007 1504   PHURINE 5.5 04/04/2007 1504   GLUCOSEU NEGATIVE 04/04/2007 1504   HGBUR NEGATIVE 04/04/2007 1504   BILIRUBINUR NEGATIVE 04/04/2007 1504   KETONESUR NEGATIVE 04/04/2007 1504   PROTEINUR NEGATIVE 04/04/2007 1504   UROBILINOGEN 0.2 04/04/2007 1504   NITRITE NEGATIVE 04/04/2007 1504   LEUKOCYTESUR  04/04/2007 1504    NEGATIVE  MICROSCOPIC NOT DONE ON URINES WITH NEGATIVE PROTEIN, BLOOD, LEUKOCYTES, NITRITE, OR GLUCOSE <1000 mg/dL.    No results found for: LABMICR, WBCUA, RBCUA, LABEPIT, MUCUS, BACTERIA  Pertinent Imaging: CT l-spine  No results found for this or any previous visit. No results found for this or any previous visit. No results found for this or any previous visit. No results found for this or any previous visit. No results found for this or any previous visit. No results found for this or any previous visit. No results found for this or any previous visit. No results found for this or any previous visit.  Assessment & Plan:    1. Nephrolithiasis We discussed dietary changes to prevent stones. We discussed the nature r/b/a to surveillance, ESWL and URS for the LLP stone. I'll set her up for a renal US and KUB and we can discuss options. She had PFO closure and is worried the ESWL will stimulate her heart and back (has "rods" in her back). She is also worried about passing the fragments and doesn't want any "maybe, should've").   2. Gross hematuria - will go ahead and plan for cystoscopy here to clear the bladder.   - Urinalysis, Complete   No follow-ups on file.  [name] Field, MD  [facility] [name], [address] [place], [place] [address] [phone]

## 2019-07-31 ENCOUNTER — Telehealth: Payer: Self-pay

## 2019-07-31 NOTE — Telephone Encounter (Signed)
-----   Message from Lizbeth Bark, CMA sent at 07/31/2019 10:53 AM EDT -----  ----- Message ----- From: Jerilee Field, MD Sent: 07/31/2019   9:34 AM EDT To: Lizbeth Bark, CMA  Crysta - let Kyrstan know her KUB did not show or see any stones, so we need to check the renal US as planned and I'll see her back for cystoscopy. Thanks.   ----- Message ----- From: Levada Schilling, CMA Sent: 07/31/2019   7:31 AM EDT To: Jerilee Field, MD   ----- Message ----- From: Interface, Rad Results In Sent: 07/30/2019  11:59 PM EDT To: Jennette Kettle Clinical

## 2019-07-31 NOTE — Telephone Encounter (Signed)
Patient showed verbal understanding and will proceed with RUS.

## 2019-08-25 ENCOUNTER — Other Ambulatory Visit: Payer: Self-pay

## 2019-08-25 ENCOUNTER — Ambulatory Visit
Admission: RE | Admit: 2019-08-25 | Discharge: 2019-08-25 | Disposition: A | Discharge status: Home / Self Care | Payer: Medicare PPO | Source: Ambulatory Visit | Attending: Urology | Admitting: Urology

## 2019-08-25 DIAGNOSIS — R310 Gross hematuria: Secondary | ICD-10-CM | POA: Diagnosis present

## 2019-08-29 ENCOUNTER — Ambulatory Visit: Payer: Medicare PPO | Admitting: Urology

## 2019-08-29 ENCOUNTER — Other Ambulatory Visit: Payer: Self-pay

## 2019-08-29 ENCOUNTER — Encounter: Payer: Self-pay | Admitting: Urology

## 2019-08-29 VITALS — BP 132/79 | HR 67 | Ht 61.5 in | Wt 207.0 lb

## 2019-08-29 DIAGNOSIS — R310 Gross hematuria: Secondary | ICD-10-CM

## 2019-08-29 DIAGNOSIS — N200 Calculus of kidney: Secondary | ICD-10-CM | POA: Diagnosis not present

## 2019-08-29 NOTE — Progress Notes (Signed)
08/29/2019 3:46 PM   [name] [name] [birth year] [id number]  Referring provider: [name], MD [address] [place],  [place] [address]  Chief Complaint  Patient presents with   Cysto    HPI:  F/u -   1) kidney stones - .  A CT of the L-spine 06/21/2016 revealed a 5 mm left lower pole stone.  The kidneys were well visualized. However, she has a report from [facility] - R Dec 2020 CT A/P that the LLP stone has grown to 8 mm and there are no other stones commented on the report. She is concerned and wants treatment. She has a history of kidney stones. She passed a 3 mm stone and 5 mm in the past.   2) MH - she c/o blood on the toilet paper 05/21. She has had a hx. Urine was orange. MH on UA today .    She returns for cysto and eval stones. No stone was noted on KUB or renal US. Stone reported as 40mm in LLP and she doesn't passing a stone since then. She doesn't want cysto today if she would need URS and she wants URS if there is a left LP stone.   PMH: Past Medical History:  Diagnosis Date   Allergy to Coumadin    Not an allergy but pt declines Coumadin   Diverticulitis    August, 2013   Ejection fraction    EF normal by bedside echo per Dr. Andee Lineman, office visit, July, 2012   Fatigue    Galactorrhea syndrome    GERD (gastroesophageal reflux disease)    Hemiparesis, right (HCC)    History of kidney stones    Hypertension    Hypothyroidism, acquired, autoimmune    Kidney stones    Memory problem    Migraine headache    Versus TIAs   Patent foramen ovale    Status post device closure  at [facility]  25 mmGore Helix occluder device   Pre-diabetes    Sleep apnea    Stroke [facility])    Silent right brain stroke/cryptogenic stroke   Thyroid disease    Hypothyroidism, post-radiation   Thyroiditis, autoimmune    Tobacco abuse    Urinary calculus    Vertigo    Visual changes     Surgical History: Past Surgical History:  Procedure  Laterality Date   APPENDECTOMY  02/2019   COLONOSCOPY WITH PROPOFOL N/A 01/01/2017   Procedure: COLONOSCOPY WITH PROPOFOL;  Surgeon: [name], MD;  Location: [facility] ENDOSCOPY;  Service: Endoscopy;  Laterality: N/A;   ESOPHAGOGASTRODUODENOSCOPY (EGD) WITH PROPOFOL N/A 01/01/2017   Procedure: ESOPHAGOGASTRODUODENOSCOPY (EGD) WITH PROPOFOL;  Surgeon: [name], MD;  Location: [facility] ENDOSCOPY;  Service: Endoscopy;  Laterality: N/A;   I-131 TREATMENT  05/25/2006   PATENT FORAMEN OVALE(PFO) CLOSURE     TONSILLECTOMY     TONSILLECTOMY AND ADENOIDECTOMY     TOTAL ABDOMINAL HYSTERECTOMY  1997   No bso    Home Medications:  Allergies as of 08/29/2019      Reactions   Crestor [rosuvastatin Calcium] Other (See Comments)   Muscle cramps.   Cymbalta [duloxetine Hcl] Diarrhea   Doxycycline    REACTION: Facial numbness, Nausea   Metronidazole    REACTION: "blood vessels come up to top of skin"      Medication List       Accurate as of August 29, 2019  3:46 PM. If you have any questions, ask your nurse or doctor.  STOP taking these medications   doxycycline 100 MG capsule Commonly known as: VIBRAMYCIN Stopped by: Jerilee Field, MD   fexofenadine-pseudoephedrine 180-240 MG 24 hr tablet Commonly known as: ALLEGRA-D 24 Stopped by: Jerilee Field, MD   omeprazole 20 MG capsule Commonly known as: PRILOSEC Stopped by: Jerilee Field, MD     TAKE these medications   carvedilol 12.5 MG tablet Commonly known as: COREG Take 12.5 mg by mouth 2 (two) times daily.   cetirizine 10 MG tablet Commonly known as: ZYRTEC Take 10 mg by mouth daily.   Cholecalciferol 125 MCG (5000 UT) Tabs Take by mouth.   clopidogrel 75 MG tablet Commonly known as: PLAVIX Take 75 mg by mouth daily.   levothyroxine 112 MCG tablet Commonly known as: SYNTHROID Take 1 Tablet by mouth once daily before breakfast and one additional half pill, twice a week on Wednesdays and  Sundays   LUTEIN PO Take by mouth daily.   methocarbamol 500 MG tablet Commonly known as: ROBAXIN Take 500 mg by mouth every 8 (eight) hours as needed.   Multi-Vitamins Tabs Take by mouth.   RABEprazole 20 MG tablet Commonly known as: Aciphex Take one daily.   VITAMIN DAILY PO Take by mouth in the morning and at bedtime. 2 drops daily       Allergies:  Allergies  Allergen Reactions   Crestor [Rosuvastatin Calcium] Other (See Comments)    Muscle cramps.   Cymbalta [Duloxetine Hcl] Diarrhea   Doxycycline     REACTION: Facial numbness, Nausea   Metronidazole     REACTION: "blood vessels come up to top of skin"    Family History: Family History  Problem Relation Age of Onset   Stroke Other    Hypertension Other    Diabetes Mother    Diabetes Father    Thyroid disease Maternal Aunt    Diabetes Maternal Grandmother    Diabetes Paternal Grandmother    Cancer Paternal Grandmother     Social History:  reports that she has quit smoking. Her smoking use included cigarettes. She has a 35.00 pack-year smoking history. She has never used smokeless tobacco. She reports current alcohol use. She reports that she does not use drugs.   Physical Exam: BP 132/79 (BP Location: Left Arm, Patient Position: Sitting, Cuff Size: Large)    Pulse 67    Ht 5' 1.5" (1.562 m)    Wt 207 lb (93.9 kg)    BMI 38.48 kg/m   Constitutional:  Alert and oriented, No acute distress. HEENT: [place] AT, moist mucus membranes.  Trachea midline, no masses. Cardiovascular: No clubbing, cyanosis, or edema. Respiratory: Normal respiratory effort, no increased work of breathing. GI: Abdomen is soft, nontender, nondistended, no abdominal masses GU: No CVA tenderness Skin: No rashes, bruises or suspicious lesions. Neurologic: Grossly intact, no focal deficits, moving all 4 extremities. Psychiatric: Normal mood and affect.  Laboratory Data: Lab Results  Component Value Date   WBC 8.4 04/04/2007    HGB 11.0 POST TRANSFUSION SPECIMEN (L) 04/10/2007   HCT 31.8 (L) 04/10/2007   MCV 97.4 04/04/2007   PLT 245 04/04/2007    Lab Results  Component Value Date   CREATININE 0.81 12/10/2017    No results found for: PSA  No results found for: TESTOSTERONE  Lab Results  Component Value Date   HGBA1C 5.8 (H) 07/01/2019    Urinalysis    Component Value Date/Time   COLORURINE YELLOW 04/04/2007 1504   APPEARANCEUR Clear 07/30/2019 1307   LABSPEC 1.014 04/04/2007  [place] 5.5 04/04/2007 1504   GLUCOSEU Negative 07/30/2019 [place] 04/04/2007 1504   BILIRUBINUR Negative 07/30/2019 [place] 04/04/2007 1504   PROTEINUR Negative 07/30/2019 1307   PROTEINUR NEGATIVE 04/04/2007 1504   UROBILINOGEN 0.2 04/04/2007 1504   NITRITE Negative 07/30/2019 1307   NITRITE NEGATIVE 04/04/2007 1504   LEUKOCYTESUR Negative 07/30/2019 1307    Lab Results  Component Value Date   LABMICR See below: 07/30/2019   WBCUA 0-5 07/30/2019   LABEPIT 0-10 07/30/2019   BACTERIA None seen 07/30/2019    Pertinent Imaging: KUB and [place] images - reviewed with patient  Results for orders placed during the hospital encounter of 07/30/19  Abdomen 1 view (KUB)  Narrative CLINICAL DATA:  Nephrolithiasis  EXAM: ABDOMEN - 1 VIEW  COMPARISON:  CT dated March 26, 2019  FINDINGS: The bowel gas pattern is normal. No radio-opaque calculi or other significant radiographic abnormality are seen. The patient is status post prior lumbar fusion. There are few calcifications projecting over the patient's pelvis favored to represent phleboliths.  IMPRESSION: Negative.   Electronically Signed By: Constance Holster M.D. On: 07/30/2019 23:56  No results found for this or any previous visit.  No results found for this or any previous visit.  No results found for this or any previous visit.  Results for orders placed during the hospital encounter of 08/25/19  US  RENAL  Narrative CLINICAL DATA:  Gross hematuria.  EXAM: RENAL / URINARY TRACT ULTRASOUND COMPLETE  COMPARISON:  None.  FINDINGS: Right Kidney:  Renal measurements: 11.3 cm x 4.1 cm x 5.0 cm = volume: 121.1 mL . Echogenicity within normal limits. No mass or hydronephrosis visualized.  Left Kidney:  Renal measurements: 11.6 cm x 5.3 cm x 5.6 cm = volume: 180.60 mL. Echogenicity within normal limits. No mass or hydronephrosis visualized.  Bladder:  Appears normal for degree of bladder distention.  Other:  None.  IMPRESSION: Normal renal ultrasound.   Electronically Signed By: Virgina Norfolk M.D. On: 08/25/2019 23:22  No results found for this or any previous visit.  No results found for this or any previous visit.  No results found for this or any previous visit.   Assessment & Plan:    1. Gross hematuria MH on UA today. She wants to hold off on cysto in case she needs URS. - Urinalysis, Complete  2. Kidney stone - discussed no stone seen on KUB or [place], but she is convinced of the 8 mm stone on UNCR CT scan from 02/2019. She doesn't have the disc today. We discussed letting me review the disc but she wants to proceed with another CT given the other one was 6 mo away.   No follow-ups on file.  Festus Aloe, MD  [facility] [address] [place], [place] [address] [phone]

## 2019-08-29 NOTE — Patient Instructions (Signed)

## 2019-09-01 LAB — MICROSCOPIC EXAMINATION: Bacteria, UA: NONE SEEN

## 2019-09-01 LAB — URINALYSIS, COMPLETE
Bilirubin, UA: NEGATIVE
Glucose, UA: NEGATIVE
Ketones, UA: NEGATIVE
Leukocytes,UA: NEGATIVE
Nitrite, UA: NEGATIVE
Protein,UA: NEGATIVE
Specific Gravity, UA: >1.03 — ABNORMAL HIGH (ref 1.005–1.030)
Urobilinogen, Ur: 0.2 mg/dL (ref 0.2–1.0)
pH, UA: 5 (ref 5.0–7.5)

## 2019-09-05 ENCOUNTER — Other Ambulatory Visit (INDEPENDENT_AMBULATORY_CARE_PROVIDER_SITE_OTHER): Payer: Self-pay | Admitting: "Endocrinology

## 2019-09-12 ENCOUNTER — Other Ambulatory Visit: Payer: Self-pay

## 2019-09-14 NOTE — Progress Notes (Signed)
CC: [name] [name] presents at this televisit today for follow-up hypothyroid s/p I-131 treatment, hypertension, goiter, dyspepsia, fatigue, GERD, Hashimoto's disease, status post CVA, partial right hemiparesis, hyperlipidemia, smoking, prediabetes, and supraclavicular fullness    History of present illness: [name]. [name] is a 59 year old Caucasian woman.   1. [name] [name] first presented to me on 08/10/04 in referral from her primary care provider, Dr. Frederica Kuster in [place], [place], for evaluation and management of hyperthyroidism. In retrospect, the patient had chronic problems with infected sinuses, inner ear symptoms, and spinning dizziness. She also had complaints of severe fatigue and weight gain. Dr. Jonette Eva had performed thyroid tests on 06/14/04. TSH was 0.13, free T4 1.04. Thyroid ultrasound showed small and large nodules in a thyroid gland that was somewhat enlarged overall. On examination she had a 20+ gram thyroid gland. Thyroid gland was firm, but nontender. She also had some diastolic hypertension. Thyroid tests showed a TSH of 0.437, free T4 1.01, and free T3 of 2.6. Her TPO antibody level was 29 9. Her TSI level was 98. It appeared at that time that the patient likely had some elevation of thyroid hormone due to Hashimoto's disease or had a combination of intermittently active Hashimoto's Disease and intermittently active  Graves' Disease.   2. During the next 2 years, her TSH values varied from 0.350-0.854. By February of 2008, however, her TSH decreased to 0.008, her free T4 increased to 2.08, and her free T3 increased to 5.8. She was clearly hyperthyroid and thyrotoxic. TSI level at that point was 1.8 with normal being 1.3 or less. On 05/25/06 she underwent radioactive iodine treatment with I-131. She subsequently became hypothyroid and was started on Synthroid replacement therapy. Since then her doses of Synthroid have been adjusted several times.   3. During the past 12 years the patient has had  several other medical problems:  A. She has had one or more CVAs/TIAs, which have resulted in some mild right hemiparesis and some transient memory losses. The MRI of her brain in February 2010 showed evidence for remote infarcts involving the posterior right temporal lobe and parietal lobe.    B. She also had a 3-4 month episode of spontaneous galactorrhea in 2010. Brain MRI showed a 2 x 3 mm area of delayed enhancement seen posteriorly on the left side of her pituitary gland. I felt at the time that it would be very uncharacteristic for a true prolactinoma this small to actually produce hyperprolactinemia and clinical galactorrhea. The galactorrhea subsequently resolved spontaneously.   C. On 08/02/2010 she presented with increased prominence of both supraclavicular areas. A CT scan was performed which showed no significant pathology. She was evaluated by Dr. Christia Reading of [facility]. Dr. Jenne Pane could not identify any cause of the supraclavicular area prominence other than obesity. Since then, that prominence has waxed and waned with her changes in weight.    D. The patient was started on Crestor for hyperlipidemia, but initially could not tolerate even the 5 mg dose more than twice per week. She was subsequently started on Zetia, 10 mg/day, which she tolerated better, but did not control her lipids as well. Crestor was later re-started at a lower dose and she was able to tolerate that medication. However, she later discontinued the Crestor due to symptoms.   E. She has had multiple GI complaints over time, to include severe, chronic diarrhea, heartburn, and abdominal pains. In the Summer of 2015 she saw a gastroenterologist who performed a colonoscopy and removed a  polyp. He also put her on a gluten-free and casein-free diet. Her symptoms improved on that diet, but she has not been consistent with that diet for quite some time.    F. The patient has been a smoker for many years. She successfully  stopped smoking by using Chantix several years ago, but later resumed smoking. She has been very reluctant to try to stop smoking again.   G. She had ingrown toenail surgery in late September and again in November 2020.  H. On 02/12/19 she developed acute appendicitis. She had an appendectomy at [facility] in [place] (the [facility] She was hospitalized for 10 days. She also developed pleurisy and shortness of breath. She is being followed by a doctor in [place], [place]. She stopped smoking.   I. Her abdominal CT scan showed that her kidney stone was larger prior to her March 2021 visit. She was being followed by a urologist in [place].   3. The patient's last televisit was on 05/14/19. I continued her Synthroid dose of 112 mcg/day, her calcium dose of 900 mg/day, her vitamin D dose of 3,000 IU/day, her Centrum Silver once a day, and rabeprazole, 20 mg, daily. However, after reviewing her lab results in April, I added one additional 112 mcg Synthroid tablet per week. She now takes the medication as one 112 mcg tablet for 5 days each week and 1.5 tablets on two days per week. She stopped the rabeprazole due to the possibility that the medication could cause hematuria.   A. In the interim, she has been "doing all right".   B. She still has tenderness in her RLQ and hypogastrium. .   C. She refuses to get her covid vaccine. She does not trust Dr. Karen Kays or Tamela Gammon. She is socially distancing and masking.   D. She still has episodic stabbing pains in her feet. She also gets cramps in her legs. Both of these pains tend to occur about 3 AM. These pains sometimes limit her willingness and ability to walk. Her knees have not been too bad. She has had more ankle edema at times. Her dogs are keeping her more active walking.    E. She takes Tums for her heartburn, that is worse since stopping rabeprazole.   E. She continues to have intermittent diarrhea, some days better and some days worse, not for  the past two weeks. She is pretty good at following her gluten-free diet.     F. She also has fairly consistent joint and soft tissue aches and pains. She has been having her usual low back pains.  G. She has not had any further stroke symptoms. Her neurologist continued her on Plavix, 75 mg/day.    H. She take Tums, Centrum Silver, calcium 900 mg, and 3,000 IU of vitamin D once daily.   I. She still has not had her sleep apnea evaluation because she does not want to wear a C-pap device like her mother wore many years ago.   J. Her allergies have not been acting up recently, but will be soon.  She has two dogs.       K. She is doing well emotionally.   L. She is also doing well cognitively. She again responded with her classic answer, "'I'm fine. I'm not eating checkers yet."   M. She had several tick bites earlier this summer. She was tested for RMSF.   4. Pertinent Review of Systems: Constitutional: She says, "I'm feeling pretty good today, except for  being "a little sore". Her stamina is better, but she still gets short of breath. She still snores "like a freight train" and still frequently wakes up choking. Eyes: Vision is good with her glasses. She had an eye exam in October 2020.  Neck:  She has no complaints of anterior neck swelling, soreness, tenderness, pressure, or discomfort. She has not had any recent  trapezius spasm and pinching of the cervical nerves.   Heart: She has not had any problems with fast heart rate, chest pain, or chest pressure. 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 Her cardiologist confirmed that diagnosis at her follow up appointment in September or October 2016. The patient has no other complaints of irregular heat beats, chest pain, or chest pressure.  Gastrointestinal: Her heartburn and GERD are worse. She continues to have intermittent problems with swallowing. She was told in about 2010 or 2011 that her UGI  showed a spastic esophagus.She still has frequent, intermittent diarrhea. Her GERD and heartburn are better when she avoids spicy foods, like ketchup. Unfortunately she likes spicy foods and "I love ketchup."    Legs: As above. She still occasionally has the sensation of something crawling on her skin in the upper right thigh and discomfort of her right patella. Muscle mass and strength seem unchanged. Her right side remains somewhat weaker than her left. She still notes some burning of her calves at times. She occasionally has pedal edema, but less now.  Feet: As above. She sometimes has numbness, burning, and "tingling" in her feet, more often on the right foot. She notes some edema at times. Her right leg has become stronger over time, so she only rarely drags her right foot anymore.  Neuro: She has persistent bilateral numbness in her thumbs and first two fingers bilaterally.  Mental/psychological: Her abilities to think, to pay attention, to remember, and to make decisions are good. "I ain't eating checkers." Skin: She has several new moles. Her dermatologist told her that they were nothing to worry about.   REVIEW OF SYSTEMS: There are no other significant problems involving her other body systems.  PAST MEDICAL, FAMILY, AND SOCIAL HISTORY:  1. Family: She still lives alone. She has frequent social interactions with friends and family. She has two sisters with hypothyroidism. Her daughter is also having thyroid problems. 2. Activities: She has been more physically active recently, in larger part due to needing to walk her dogs.     3. Tobacco: She stopped smoking.   4. Primary care provider: Dr. Vivia Ewing, [address], [place] in [place].  5. Neurologist: Dr. Lacey Jensen, [facility], phone [phone] 6. Cardiologist: Dr. Marny Lowenstein in [place]. She has not yet found a new cardiologist in [place].   PHYSICAL EXAM:  BP 138/74 (BP Location: Left Arm, Patient Position: Sitting,  Cuff Size: Large)   Pulse 72   Ht 5' 1.42" (1.56 m)   Wt 207 lb 9.6 oz (94.2 kg)   BMI 38.69 kg/m   Growth percentile SmartLinks can only be used for patients less than 86 years old.  Wt Readings from Last 3 Encounters:  09/15/19 207 lb 9.6 oz (94.2 kg)  08/29/19 207 lb (93.9 kg)  07/30/19 207 lb (93.9 kg)    Ht Readings from Last 3 Encounters:  09/15/19 5' 1.42" (1.56 m)  08/29/19 5' 1.5" (1.562 m)  07/30/19 5' 1.5" (1.562 m)    HC Readings from Last 3 Encounters:  No data found for [facility]   Facility age limit for growth percentiles is 20 years.  Body mass index is 38.69 kg/m. Facility age limit for growth percentiles is 20 years.  Body surface area is 2.02 meters squared.   Constitutional: The patient looks healthy and appears physically and emotionally well. She is alert, bright, affable, perky, very sharp, and has a great sense of humor.  Eyes: There is no arcus or proptosis.  Mouth: The oropharynx appears normal. The tongue appears normal. There is normal oral moisture. There is no obvious gingivitis. Neck: There are no bruits present. The thyroid gland appears normal in size. The thyroid gland is normal at approximately 18 grams in size. The consistency of the thyroid gland is normal. There is no thyroid tenderness to palpation. Lungs: The lungs are clear. Air movement is good. Heart: The heart rhythm and rate appear normal. Heart sounds S1 and S2 are normal. I do not appreciate any pathologic heart murmurs. Abdomen: The abdominal size is enlarged. Bowel sounds are normal. The abdomen is soft and non-tender. There is no obviously palpable hepatomegaly, splenomegaly, or other masses.  Arms: Muscle mass appears appropriate for age.  Hands: There is no obvious tremor. Phalangeal and metacarpophalangeal joints appear normal. Palms are normal. Legs: Muscle mass appears appropriate for age. There is no edema.  Neurologic: Muscle strength is normal for age and gender  in both the  upper and the lower extremities. Muscle tone appears normal. Sensation to touch is normal in the legs.   LABS:   Labs 08/29/19; Urinalysis: 0-5 WBC, 3-10 RBC  Labs 07/30/19: Urinalysis: 0-5 WBC, 0-2 RBC  Labs 06/25/19: HbA1c 5.8%; TSH 3.50, free T4 1.2, free T3 2.7; cholesterol 255, triglycerides 123, HDL 86, LDL 144; PTH 35 (ref 14-64), calcium 9.9 (ref 8.6-10.4); 25-OH vitamin D 27 (ref 30-100)  Labs 05/13/18; PTH 35, calcium 9.7, 25-OH vitamin D 31  Labs 05/07/18: HbA1c 5.6%; TSH 1.02, free T4 1.58, free T3 2.7; CMP normal with calcium 9.5; CBC normal; cholesterol 239, triglycerides 110, HDL 75, LDL 142;   Labs 12/13/17: ZES9Q 5.5%, CBG 91  Labs 12/10/17: TSH 1.23, free T4 1.39, free T3 2.5; CMP normal; cholesterol 254, triglycerides 96, HDL 71, LDL 163  Labs 08/08/17: HbA1c 5.4%, CBG 94  Labs 08/02/17: TSH 1.17, free T4 1.28, free T3 2.3; uric acid 6.4 (ref 2.5-7.0)  Labs 05/08/17: HbA1c 5.6%, CBG 88  Labs 04/19/17: TSH 0.453, free T4 1.61, free T3 2.7; Calcium 9.8, PTH 47; C-peptide 3.3  Labs 11/01/16: HbA1c 5.7%, CBG 84; TSH 1.23, free T4 1.5, free T3 2.7; PTH 54 (ref 14-64), calcium 9.1, 25-OH vitamin D 31  Labs 08/01/16: HbA1c 5.5%; TSH 7.10, free T4 1.0. Free T3 2.2; cholesterol 283, triglycerides 101, HDL 83, LDL 180  Labs 07/06/16: CBC normal, except RBC 3.96 (ref 4.04-5.48) and MCH 32.3 (ref 27-31.2), CMP normal  Labs 04/25/16: HbA1c 5.3%, CBG 105  Labs 01/21/16: HbA1c 5.8%; calcium 9.4, PTH 52, 25-OH vitamin D 35; TSH 0.01, free T4 1.8, free T3 3.8; cholesterol 239, triglycerides 65, HDL 75, LDL 151; CMP normal  Labs 06/02/05: TSH 5.01, free T4 1.45, TPO antibody <1, anti-thyroglobulin antibody <1; CMP normal, with calcium 10.0; CBC normal  Labs 04/27/15: HbA1c 5.5%; TSH 3.61, free T4 1.4, free T3 2.2; cholesterol 258, triglycerides 96, HDL 77, LDL 162; PTH 32, calcium 10.0, 25-OH vitamin d 33  Labs 01/12/15: TSH 0.885, free T4 1.88, free T3 3.01; CBC: Normal except MCV 98; CMP  normal;  vitamin B12 (344 (normal 239-931), folic acid 19.7 (normal >5.4); PTH 80.49 (normal 12-72), calcium 9.10 (normal 8.5-10.6), 25-OH vitamin D 32.6; cholesterol 231, triglycerides 83, HDL 66, LDL 148  Labs 09/24/14: Ordered, but not performed  Labs 05/21/14: TSH 0.490, free T4 1.46, free T3 2.4; HbA1C 5.8%; calcium 9.3, PTH 52; 25-OH vitamin D 40; cholesterol 246, triglycerides 92, HDL 70, LDL 176; calcitonin <2  Labs 02/09/14: TSH 2.96, free T4 1.43, free T3 2.2  Labs 02/05/14: Urinalysis: large blood, 15 ketones; alkaline phosphatase 108 (normal 32-92);   Labs 02/02/14: Urinalysis: trace blood, no ketones; Hgb 13.6, Hct 43.9, MCV 100.3; CMP normal, with alkaline phosphatase 16 (normal 40-129); TSH 3.03; B12 305 (211-946); cholesterol 282, triglycerides 68, HDL 79, LDL 189; 25-hydroxy vitamin D >60  Labs 07/11/13: B12 236 (normal 211-911), calcitonin < 2 (normal < 6)  Labs 03/10/13: CMP normal. TSH 1.456, free T4 1.36, free T3 2.6; cholesterol 194, triglycerides 82, HDL 72, LDL 106  Labs 11/18/13: TSH 0.539, free T4 1.30, free T3 2.6  Labs 10/16/12: CMP normal; TSH 2.20, free T4 1.10, free T3 2.2; cholesterol 202, triglycerides 77, HDL 82, LDL 105  Labs 06/18/12: TSH 1.260, free T4 1.58, free T3 2.5, cholesterol 211, triglycerides 71, HDL 78, LDL 119  Labs 02/13/12: TSH 0.235, free T4 1.83, cholesterol 236, triglycerides 85, HDL 68, LDL 151. Had been back on Crestor for about 3 weeks, but was off it for about 7 days prior to the labs being drawn.   Labs 01/09/12: AST 45, ALT 70, cholesterol 298, triglycerides 85, HDL 72, LDL 209, TSH 0.44, free T4 1.52, 25-hydroxy,  vitamin D 41. Had been off Crestor for months.   Labs 09/06/11: TSH 0.663, free T4 1.65, T3 117, cholesterol 225, triglycerides 65, HDL 84, LDL 128  Labs 05/09/11: TSH 7.253. Free T4: 1.66. CMP is normal. Cholesterol 214, triglycerides 67, HDL was 73, LDL 128. 25-OH-vitamin D 41.7. She started Zetia as of   05/15/11.  ASSESSMENT: 1. Hypothyroid, s/p I-131:   A. Her TFTs in August 2018 were normal on her Synthroid dose of 137 mcg/day for 6 days each week.    B. In February 2019 she was mildly hyperthyroid on that same dose. After changing the dose to 112 mcg/day, her TFTS on 08/02/17 and on 12/10/17 were mid-normal. Her TFTs were again mid-normal on 05/07/18. In April 2021, however, she needed more thyroid hormone.   C. She needs her follow up TFTs done.  2. Goiter: Thyroid gland was again within normal limits for size today.   3. Thyroiditis: Hashimoto's disease is clinically quiescent again today. The non-physiologic shifts in her TFTs at times are c/w intermittent Hashimoto's Dz activity. 4. Prediabetes/Elevated HbA1c: Her HbA1c was up into the prediabetes zone in August 2018, but then decreased back into the normal range. Her HbA1c was at the upper end of the normal range on 05/07/18, but was back into the prediabetes range in April 2021.  5. Obesity: Her weight has increased more. She needs to be careful with her diet.   6. Hypertension: She needs more daily exercise.  7. Hypercholesterolemia: The patient's total cholesterol and LDL in March 2020 were better than in October 2019, but still too high. We discussed the possible use of injectable PCSK9 inhibitor medications at her last visit, but she was not in favor of that option. She needs to lose weight.   8. Dyspepsia/GERD: She was diagnosed with a gastric ulcer in October 2018. She did not have any  ulcer symptoms. She was taking omeprazole. She feels that her reflux symptoms have improved since starting rabeprazole. Unfortunately, she stopped the rabeprazole due to concerns that the medication might be causing hematuria. Her acid symptoms are worse now.  10. Tobacco: She stopped smoking during her 10-day hospitalization and has remained "smoke free" since then.   10. Edema: She had no edema at her last visit, but has had edema at times since then.   11. 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 When she was on the diet her diarrhea stopped. When she went off the diet her diarrhea worsened. Her calcitonin was negative, ruling out hypercalcitoninemia as a cause of her diarrhea. She continues to be followed by GI. No obvious lesions were seen during her colonoscopy in October 2019. She had to cancel her planned colonoscopy in December 2020 due to her appendicitis. She has not had any diarrhea recently.  12. Excess vitamin D: Her vitamin D levels in 2016, in 2017, and again in 2018 were normal. Her vitamin D level in March 2020 was low-normal. Her vitamin D level in April 2021 was mildly low.  13. S/p stroke: Her MRI on 04/16/08 showed CNS defects in the right cerebrum. Her neurologic defects, however, were on the right side of her body. She may have had more CNS damage in her left cerebrum than had been recognized.  She seems to have recovered fairly well, but still has some right hemiparetic findings. She is doing very well emotionally and mentally.  14. Hyperparathyroidism, secondary to inadequate calcium intake: Her PTH and calcium values in August 2018 were normal, but the calcium was at about the 25% of the normal range. Her calcium and PTH values in February 2019 were good. Her calcium and PTH in March 2020 and in April 2021 were normal.  15. Snoring: I suspect that she has OSA. She refuses to have a sleep study done.  16. Nasal congestion and associated symptoms: Her allergies have been acting up more this Summer, but Zyrtec helps.   17. Arthritis: I suspect that she has osteoarthritis. Her uric acid level was normal in May 2019.  18. Plantar fasciitis: Some of her foot pain in the past has been due to planter fasciitis. I suggested that she purchase some good insoles with arch support. I also suggested that she see a podiatrist.    19. Supraclavicular fullness: The prominence in the supraclavicular fossae has improved over time and  was not evident at her last clinic visit. It appears that the prominence was just fatty tissue.  PLAN: 1. Diagnostic: I ordered TFTs.    2. Therapeutic: Continue rabeprazole, 20 mg/day. Continue Synthroid dose of 112 mcg/day, with an additional 1/2 pill on two days per week. Continue calcium dose of 900 mg/day. Continue the vitamin D dose of 3,000 IU/day. Continue the Centrum Silver.  Stop smoking. Take Tums as above. 3. Patient education: We discussed her GI problems, her thyroiditis and hypothyroidism, her cholesterols, her neuro issues, her edema, and her foot problems. 4. Follow-up: Follow up visit in 4 months.  Level of Service: This visit lasted in excess of 55 minutes. More than 50% of the visit was devoted to counseling.  Molli KnockMichael Del Wiseman, MD, CDE Adult and Pediatric Endocrinology

## 2019-09-15 ENCOUNTER — Ambulatory Visit (INDEPENDENT_AMBULATORY_CARE_PROVIDER_SITE_OTHER): Payer: Medicare PPO | Admitting: "Endocrinology

## 2019-09-15 ENCOUNTER — Encounter (INDEPENDENT_AMBULATORY_CARE_PROVIDER_SITE_OTHER): Payer: Self-pay | Admitting: "Endocrinology

## 2019-09-15 ENCOUNTER — Other Ambulatory Visit: Payer: Self-pay

## 2019-09-15 VITALS — BP 138/74 | HR 72 | Ht 61.42 in | Wt 207.6 lb

## 2019-09-15 DIAGNOSIS — E063 Autoimmune thyroiditis: Secondary | ICD-10-CM | POA: Diagnosis not present

## 2019-09-15 DIAGNOSIS — R7303 Prediabetes: Secondary | ICD-10-CM | POA: Diagnosis not present

## 2019-09-15 DIAGNOSIS — E049 Nontoxic goiter, unspecified: Secondary | ICD-10-CM | POA: Diagnosis not present

## 2019-09-15 DIAGNOSIS — K219 Gastro-esophageal reflux disease without esophagitis: Secondary | ICD-10-CM | POA: Diagnosis not present

## 2019-09-15 DIAGNOSIS — R600 Localized edema: Secondary | ICD-10-CM

## 2019-09-15 DIAGNOSIS — E7800 Pure hypercholesterolemia, unspecified: Secondary | ICD-10-CM

## 2019-09-15 DIAGNOSIS — I10 Essential (primary) hypertension: Secondary | ICD-10-CM

## 2019-09-15 LAB — T4, FREE: Free T4: 1.3 ng/dL (ref 0.8–1.8)

## 2019-09-15 LAB — T3, FREE: T3, Free: 3.3 pg/mL (ref 2.3–4.2)

## 2019-09-15 LAB — TSH: TSH: 0.49 mIU/L (ref 0.40–4.50)

## 2019-09-15 NOTE — Patient Instructions (Signed)
Follow up visit in 4 months.  

## 2019-09-26 ENCOUNTER — Other Ambulatory Visit: Payer: Self-pay | Admitting: Radiology

## 2019-09-26 ENCOUNTER — Encounter: Payer: Self-pay | Admitting: Urology

## 2019-09-26 ENCOUNTER — Other Ambulatory Visit: Payer: Self-pay

## 2019-09-26 ENCOUNTER — Ambulatory Visit: Payer: Medicare PPO | Admitting: Urology

## 2019-09-26 VITALS — BP 111/62 | HR 80 | Ht 61.0 in | Wt 208.6 lb

## 2019-09-26 DIAGNOSIS — N201 Calculus of ureter: Secondary | ICD-10-CM | POA: Diagnosis not present

## 2019-09-26 DIAGNOSIS — R3129 Other microscopic hematuria: Secondary | ICD-10-CM

## 2019-09-26 DIAGNOSIS — N200 Calculus of kidney: Secondary | ICD-10-CM

## 2019-09-26 NOTE — H&P (View-Only) (Signed)
09/26/2019 2:24 PM   [name] [name] 1961/01/28 [id number]  Referring provider: [name], MD [address] [place],  [place] [address]  Chief Complaint  Patient presents with  . Results    HPI:  F/u -   1) kidney stones - . A CT of the L-spine 4/18/2018revealed a 5 mm left lower pole stone. The kidneys were well visualized. However, she has a report from [facility] - R Dec 2020 CT A/P that the LLP stone has grown to 8 mm and there are no other stones commented on the report. She is concerned and wants treatment. She has a history of kidney stones.She passed a 3 mm stone and 5 mm in the past. A f/u KUB and renal US showed no stone.   2) MH - she c/o blood on the toilet paper 05/21. She has had a hx. Urine was orange. MH on UA in office. She declined cystoscopy in the event she would need URS.   She returns and she underwent repeat CT A/P in [place]. She couldn't get it here due to insurance, report says no "stones or suspicious inflammation". "No evidence of renal calculus".  I did review the images on disc and there is no renal stone but there is a left distal calculus that is linear that could be in the ureter. It looks similar to the stone that was in the LLP on the [facility] disc from Dec 2020. It's difficult to get a side by side comparison as I have to take one disc out and load the other. She did have MH last time. Pt hasnt seen a stone pass. Has some Sp pressure. No gross hematuria.   PMH: Past Medical History:  Diagnosis Date  . Allergy to Coumadin    Not an allergy but pt declines Coumadin  . Diverticulitis    August, 2013  . Ejection fraction    EF normal by bedside echo per Dr. Andee Lineman, office visit, July, 2012  . Fatigue   . Galactorrhea syndrome   . GERD (gastroesophageal reflux disease)   . Hemiparesis, right (HCC)   . History of kidney stones   . Hypertension   . Hypothyroidism, acquired, autoimmune   . Kidney stones   . Memory problem   . Migraine  headache    Versus TIAs  . Patent foramen ovale    Status post device closure  at [facility]  25 mmGore Helix occluder device  . Pre-diabetes   . Sleep apnea   . Stroke [facility])    Silent right brain stroke/cryptogenic stroke  . Thyroid disease    Hypothyroidism, post-radiation  . Thyroiditis, autoimmune   . Tobacco abuse   . Urinary calculus   . Vertigo   . Visual changes     Surgical History: Past Surgical History:  Procedure Laterality Date  . APPENDECTOMY  02/2019  . COLONOSCOPY WITH PROPOFOL N/A 01/01/2017   Procedure: COLONOSCOPY WITH PROPOFOL;  Surgeon: [name], MD;  Location: [facility] ENDOSCOPY;  Service: Endoscopy;  Laterality: N/A;  . ESOPHAGOGASTRODUODENOSCOPY (EGD) WITH PROPOFOL N/A 01/01/2017   Procedure: ESOPHAGOGASTRODUODENOSCOPY (EGD) WITH PROPOFOL;  Surgeon: [name], MD;  Location: [facility] ENDOSCOPY;  Service: Endoscopy;  Laterality: N/A;  . I-131 TREATMENT  05/25/2006  . PATENT FORAMEN OVALE(PFO) CLOSURE    . TONSILLECTOMY    . TONSILLECTOMY AND ADENOIDECTOMY    . TOTAL ABDOMINAL HYSTERECTOMY  1997   No bso    Home Medications:  Allergies as of 09/26/2019  Reactions   Crestor [rosuvastatin Calcium] Other (See Comments)   Muscle cramps.   Cymbalta [duloxetine Hcl] Diarrhea   Doxycycline    REACTION: Facial numbness, Nausea   Metronidazole    REACTION: "blood vessels come up to top of skin"      Medication List       Accurate as of September 26, 2019  2:24 PM. If you have any questions, ask your nurse or doctor.        carvedilol 12.5 MG tablet Commonly known as: COREG Take 12.5 mg by mouth 2 (two) times daily.   cetirizine 10 MG tablet Commonly known as: ZYRTEC Take 10 mg by mouth daily.   Cholecalciferol 125 MCG (5000 UT) Tabs Take by mouth.   clopidogrel 75 MG tablet Commonly known as: PLAVIX Take 75 mg by mouth daily.   levothyroxine 112 MCG tablet Commonly known as: SYNTHROID Take 1 Tablet by mouth once daily  before breakfast and one additional half pill, twice a week on Wednesdays and Sundays   LUTEIN PO Take by mouth daily.   methocarbamol 500 MG tablet Commonly known as: ROBAXIN Take 500 mg by mouth every 8 (eight) hours as needed.   Multi-Vitamins Tabs Take by mouth.   RABEprazole 20 MG tablet Commonly known as: ACIPHEX Take 1 tablet by mouth once daily   VITAMIN DAILY PO Take by mouth in the morning and at bedtime. 2 drops daily       Allergies:  Allergies  Allergen Reactions  . Crestor [Rosuvastatin Calcium] Other (See Comments)    Muscle cramps.  Marland Kitchen Cymbalta [Duloxetine Hcl] Diarrhea  . Doxycycline     REACTION: Facial numbness, Nausea  . Metronidazole     REACTION: "blood vessels come up to top of skin"    Family History: Family History  Problem Relation Age of Onset  . Stroke Other   . Hypertension Other   . Diabetes Mother   . Diabetes Father   . Thyroid disease Maternal Aunt   . Diabetes Maternal Grandmother   . Diabetes Paternal Grandmother   . Cancer Paternal Grandmother     Social History:  reports that she has quit smoking. Her smoking use included cigarettes. She has a 35.00 pack-year smoking history. She has never used smokeless tobacco. She reports current alcohol use. She reports that she does not use drugs.   Physical Exam: There were no vitals taken for this visit.  Constitutional:  Alert and oriented, No acute distress. HEENT: [place] AT, moist mucus membranes.  Trachea midline, no masses. Cardiovascular: No clubbing, cyanosis, or edema. Respiratory: Normal respiratory effort, no increased work of breathing. GI: Abdomen is soft, nontender, nondistended, no abdominal masses GU: No CVA tenderness Skin: No rashes, bruises or suspicious lesions. Neurologic: Grossly intact, no focal deficits, moving all 4 extremities. Psychiatric: Normal mood and affect.  Laboratory Data: Lab Results  Component Value Date   WBC 8.4 04/04/2007   HGB 11.0 POST  TRANSFUSION SPECIMEN (L) 04/10/2007   HCT 31.8 (L) 04/10/2007   MCV 97.4 04/04/2007   PLT 245 04/04/2007    Lab Results  Component Value Date   CREATININE 0.81 12/10/2017    No results found for: PSA  No results found for: TESTOSTERONE  Lab Results  Component Value Date   HGBA1C 5.8 (H) 07/01/2019    Urinalysis    Component Value Date/Time   COLORURINE YELLOW 04/04/2007 1504   APPEARANCEUR Hazy (A) 08/29/2019 1457   LABSPEC 1.014 04/04/2007 1504   PHURINE 5.5  04/04/2007 1504   GLUCOSEU Negative 08/29/2019 1457   HGBUR NEGATIVE 04/04/2007 1504   BILIRUBINUR Negative 08/29/2019 1457   KETONESUR NEGATIVE 04/04/2007 1504   PROTEINUR Negative 08/29/2019 1457   PROTEINUR NEGATIVE 04/04/2007 1504   UROBILINOGEN 0.2 04/04/2007 1504   NITRITE Negative 08/29/2019 1457   NITRITE NEGATIVE 04/04/2007 1504   LEUKOCYTESUR Negative 08/29/2019 1457    Lab Results  Component Value Date   LABMICR See below: 08/29/2019   WBCUA 0-5 08/29/2019   LABEPIT 0-10 08/29/2019   BACTERIA None seen 08/29/2019    Pertinent Imaging: CT scan from [facility] on disc 02/2019. CT from [place] on disc July 2021.  Results for orders placed during the hospital encounter of 07/30/19  Abdomen 1 view (KUB)  Narrative CLINICAL DATA:  Nephrolithiasis  EXAM: ABDOMEN - 1 VIEW  COMPARISON:  CT dated March 26, 2019  FINDINGS: The bowel gas pattern is normal. No radio-opaque calculi or other significant radiographic abnormality are seen. The patient is status post prior lumbar fusion. There are few calcifications projecting over the patient's pelvis favored to represent phleboliths.  IMPRESSION: Negative.   Electronically Signed By: Christopher  Green M.D. On: 07/30/2019 23:56  No results found for this or any previous visit.  No results found for this or any previous visit.  No results found for this or any previous visit.  Results for orders placed during the hospital encounter of  08/25/19  US RENAL  Narrative CLINICAL DATA:  Gross hematuria.  EXAM: RENAL / URINARY TRACT ULTRASOUND COMPLETE  COMPARISON:  None.  FINDINGS: Right Kidney:  Renal measurements: 11.3 cm x 4.1 cm x 5.0 cm = volume: 121.1 mL . Echogenicity within normal limits. No mass or hydronephrosis visualized.  Left Kidney:  Renal measurements: 11.6 cm x 5.3 cm x 5.6 cm = volume: 180.60 mL. Echogenicity within normal limits. No mass or hydronephrosis visualized.  Bladder:  Appears normal for degree of bladder distention.  Other:  None.  IMPRESSION: Normal renal ultrasound.   Electronically Signed By: Thaddeus  Houston M.D. On: 08/25/2019 23:22  No results found for this or any previous visit.  No results found for this or any previous visit.  No results found for this or any previous visit.   Assessment & Plan:    Left renal stone now possible left ureteral stone - stone in LLP on Dec 2020 CT may be in left distal ureter on CT Jul 2021. Discussed with patient the nature r/b/a to cystoscopy (she has had microhematuria as well), left retrograde, left ureteroscopy, possible laser lithotripsy and stent. Discussed one of my colleagues would be doing the procedure. Discussed she may have passed the stone and may not have a stone or the calcification may be in a vessel. All questions answered. She elects to proceed.   No follow-ups on file.  Melburn Treiber, MD  [facility] [address], [address] [place], [place] [address] [phone]   

## 2019-09-26 NOTE — Patient Instructions (Signed)
Ureteroscopy Ureteroscopy is a procedure to check for and treat problems inside part of the urinary tract. In this procedure, a thin, tube-shaped instrument with a light at the end (ureteroscope) is used to look at the inside of the kidneys and the ureters, which are the tubes that carry urine from the kidneys to the bladder. The ureteroscope is inserted into one or both of the ureters. You may need this procedure if you have frequent urinary tract infections (UTIs), blood in your urine, or a stone in one of your ureters. A ureteroscopy can be done to find the cause of urine blockage in a ureter and to evaluate other abnormalities inside the ureters or kidneys. If stones are found, they can be removed during the procedure. Polyps, abnormal tissue, and some types of tumors can also be removed or treated. The ureteroscope may also have a tool to remove tissue to be checked for disease under a microscope (biopsy). Tell a health care provider about:  Any allergies you have.  All medicines you are taking, including vitamins, herbs, eye drops, creams, and over-the-counter medicines.  Any problems you or family members have had with anesthetic medicines.  Any blood disorders you have.  Any surgeries you have had.  Any medical conditions you have.  Whether you are pregnant or may be pregnant. What are the risks? Generally, this is a safe procedure. However, problems may occur, including:  Bleeding.  Infection.  Allergic reactions to medicines.  Scarring that narrows the ureter (stricture).  Creating a hole in the ureter (perforation). What happens before the procedure? Staying hydrated Follow instructions from your health care provider about hydration, which may include:  Up to 2 hours before the procedure - you may continue to drink clear liquids, such as water, clear fruit juice, black coffee, and plain tea. Eating and drinking restrictions Follow instructions from your health care  provider about eating and drinking, which may include:  8 hours before the procedure - stop eating heavy meals or foods such as meat, fried foods, or fatty foods.  6 hours before the procedure - stop eating light meals or foods, such as toast or cereal.  6 hours before the procedure - stop drinking milk or drinks that contain milk.  2 hours before the procedure - stop drinking clear liquids. Medicines  Ask your health care provider about: ? Changing or stopping your regular medicines. This is especially important if you are taking diabetes medicines or blood thinners. ? Taking medicines such as aspirin and ibuprofen. These medicines can thin your blood. Do not take these medicines before your procedure if your health care provider instructs you not to.  You may be given antibiotic medicine to help prevent infection. General instructions  You may have a urine sample taken to check for infection.  Plan to have someone take you home from the hospital or clinic. What happens during the procedure?   To reduce your risk of infection: ? Your health care team will wash or sanitize their hands. ? Your skin will be washed with soap.  An IV tube will be inserted into one of your veins.  You will be given one of the following: ? A medicine to help you relax (sedative). ? A medicine to make you fall asleep (general anesthetic). ? A medicine that is injected into your spine to numb the area below and slightly above the injection site (spinal anesthetic).  To lower your risk of infection, you may be given an antibiotic medicine   by an injection or through the IV tube.  The opening from which you urinate (urethra) will be cleaned with a germ-killing solution.  The ureteroscope will be passed through your urethra into your bladder.  A salt-water solution will flow through the ureteroscope to fill your bladder. This will help the health care provider see the openings of your ureters more  clearly.  Then, the ureteroscope will be passed into your ureter. ? If a growth is found, a piece of it may be removed so it can be examined under a microscope (biopsy). ? If a stone is found, it may be removed through the ureteroscope, or the stone may be broken up using a laser, shock waves, or electrical energy. ? In some cases, if the ureter is too small, a tube may be inserted that keeps the ureter open (ureteral stent). The stent may be left in place for 1 or 2 weeks to keep the ureter open, and then the ureteroscopy procedure will be performed.  The scope will be removed, and your bladder will be emptied. The procedure may vary among health care providers and hospitals. What happens after the procedure?  Your blood pressure, heart rate, breathing rate, and blood oxygen level will be monitored until the medicines you were given have worn off.  You may be asked to urinate.  Donot drive for 24 hours if you were given a sedative. This information is not intended to replace advice given to you by your health care provider. Make sure you discuss any questions you have with your health care provider. Document Revised: 02/02/2017 Document Reviewed: 12/03/2015 Elsevier Patient Education  2020 Elsevier Inc.  

## 2019-09-26 NOTE — Progress Notes (Signed)
09/26/2019 2:24 PM   [name] [name] 1961/01/28 [id number]  Referring provider: [name], MD [address] [place],  [place] [address]  Chief Complaint  Patient presents with  . Results    HPI:  F/u -   1) kidney stones - . A CT of the L-spine 4/18/2018revealed a 5 mm left lower pole stone. The kidneys were well visualized. However, she has a report from [facility] - R Dec 2020 CT A/P that the LLP stone has grown to 8 mm and there are no other stones commented on the report. She is concerned and wants treatment. She has a history of kidney stones.She passed a 3 mm stone and 5 mm in the past. A f/u KUB and renal US showed no stone.   2) MH - she c/o blood on the toilet paper 05/21. She has had a hx. Urine was orange. MH on UA in office. She declined cystoscopy in the event she would need URS.   She returns and she underwent repeat CT A/P in [place]. She couldn't get it here due to insurance, report says no "stones or suspicious inflammation". "No evidence of renal calculus".  I did review the images on disc and there is no renal stone but there is a left distal calculus that is linear that could be in the ureter. It looks similar to the stone that was in the LLP on the [facility] disc from Dec 2020. It's difficult to get a side by side comparison as I have to take one disc out and load the other. She did have MH last time. Pt hasnt seen a stone pass. Has some Sp pressure. No gross hematuria.   PMH: Past Medical History:  Diagnosis Date  . Allergy to Coumadin    Not an allergy but pt declines Coumadin  . Diverticulitis    August, 2013  . Ejection fraction    EF normal by bedside echo per Dr. Andee Lineman, office visit, July, 2012  . Fatigue   . Galactorrhea syndrome   . GERD (gastroesophageal reflux disease)   . Hemiparesis, right (HCC)   . History of kidney stones   . Hypertension   . Hypothyroidism, acquired, autoimmune   . Kidney stones   . Memory problem   . Migraine  headache    Versus TIAs  . Patent foramen ovale    Status post device closure  at [facility]  25 mmGore Helix occluder device  . Pre-diabetes   . Sleep apnea   . Stroke [facility])    Silent right brain stroke/cryptogenic stroke  . Thyroid disease    Hypothyroidism, post-radiation  . Thyroiditis, autoimmune   . Tobacco abuse   . Urinary calculus   . Vertigo   . Visual changes     Surgical History: Past Surgical History:  Procedure Laterality Date  . APPENDECTOMY  02/2019  . COLONOSCOPY WITH PROPOFOL N/A 01/01/2017   Procedure: COLONOSCOPY WITH PROPOFOL;  Surgeon: [name], MD;  Location: [facility] ENDOSCOPY;  Service: Endoscopy;  Laterality: N/A;  . ESOPHAGOGASTRODUODENOSCOPY (EGD) WITH PROPOFOL N/A 01/01/2017   Procedure: ESOPHAGOGASTRODUODENOSCOPY (EGD) WITH PROPOFOL;  Surgeon: [name], MD;  Location: [facility] ENDOSCOPY;  Service: Endoscopy;  Laterality: N/A;  . I-131 TREATMENT  05/25/2006  . PATENT FORAMEN OVALE(PFO) CLOSURE    . TONSILLECTOMY    . TONSILLECTOMY AND ADENOIDECTOMY    . TOTAL ABDOMINAL HYSTERECTOMY  1997   No bso    Home Medications:  Allergies as of 09/26/2019  Reactions   Crestor [rosuvastatin Calcium] Other (See Comments)   Muscle cramps.   Cymbalta [duloxetine Hcl] Diarrhea   Doxycycline    REACTION: Facial numbness, Nausea   Metronidazole    REACTION: "blood vessels come up to top of skin"      Medication List       Accurate as of September 26, 2019  2:24 PM. If you have any questions, ask your nurse or doctor.        carvedilol 12.5 MG tablet Commonly known as: COREG Take 12.5 mg by mouth 2 (two) times daily.   cetirizine 10 MG tablet Commonly known as: ZYRTEC Take 10 mg by mouth daily.   Cholecalciferol 125 MCG (5000 UT) Tabs Take by mouth.   clopidogrel 75 MG tablet Commonly known as: PLAVIX Take 75 mg by mouth daily.   levothyroxine 112 MCG tablet Commonly known as: SYNTHROID Take 1 Tablet by mouth once daily  before breakfast and one additional half pill, twice a week on Wednesdays and Sundays   LUTEIN PO Take by mouth daily.   methocarbamol 500 MG tablet Commonly known as: ROBAXIN Take 500 mg by mouth every 8 (eight) hours as needed.   Multi-Vitamins Tabs Take by mouth.   RABEprazole 20 MG tablet Commonly known as: ACIPHEX Take 1 tablet by mouth once daily   VITAMIN DAILY PO Take by mouth in the morning and at bedtime. 2 drops daily       Allergies:  Allergies  Allergen Reactions  . Crestor [Rosuvastatin Calcium] Other (See Comments)    Muscle cramps.  Marland Kitchen Cymbalta [Duloxetine Hcl] Diarrhea  . Doxycycline     REACTION: Facial numbness, Nausea  . Metronidazole     REACTION: "blood vessels come up to top of skin"    Family History: Family History  Problem Relation Age of Onset  . Stroke Other   . Hypertension Other   . Diabetes Mother   . Diabetes Father   . Thyroid disease Maternal Aunt   . Diabetes Maternal Grandmother   . Diabetes Paternal Grandmother   . Cancer Paternal Grandmother     Social History:  reports that she has quit smoking. Her smoking use included cigarettes. She has a 35.00 pack-year smoking history. She has never used smokeless tobacco. She reports current alcohol use. She reports that she does not use drugs.   Physical Exam: There were no vitals taken for this visit.  Constitutional:  Alert and oriented, No acute distress. HEENT: [place] AT, moist mucus membranes.  Trachea midline, no masses. Cardiovascular: No clubbing, cyanosis, or edema. Respiratory: Normal respiratory effort, no increased work of breathing. GI: Abdomen is soft, nontender, nondistended, no abdominal masses GU: No CVA tenderness Skin: No rashes, bruises or suspicious lesions. Neurologic: Grossly intact, no focal deficits, moving all 4 extremities. Psychiatric: Normal mood and affect.  Laboratory Data: Lab Results  Component Value Date   WBC 8.4 04/04/2007   HGB 11.0 POST  TRANSFUSION SPECIMEN (L) 04/10/2007   HCT 31.8 (L) 04/10/2007   MCV 97.4 04/04/2007   PLT 245 04/04/2007    Lab Results  Component Value Date   CREATININE 0.81 12/10/2017    No results found for: PSA  No results found for: TESTOSTERONE  Lab Results  Component Value Date   HGBA1C 5.8 (H) 07/01/2019    Urinalysis    Component Value Date/Time   COLORURINE YELLOW 04/04/2007 1504   APPEARANCEUR Hazy (A) 08/29/2019 1457   LABSPEC 1.014 04/04/2007 1504   PHURINE 5.5  04/04/2007 1504   GLUCOSEU Negative 08/29/2019 1457   HGBUR NEGATIVE 04/04/2007 1504   BILIRUBINUR Negative 08/29/2019 1457   KETONESUR NEGATIVE 04/04/2007 1504   PROTEINUR Negative 08/29/2019 1457   PROTEINUR NEGATIVE 04/04/2007 1504   UROBILINOGEN 0.2 04/04/2007 1504   NITRITE Negative 08/29/2019 1457   NITRITE NEGATIVE 04/04/2007 1504   LEUKOCYTESUR Negative 08/29/2019 1457    Lab Results  Component Value Date   LABMICR See below: 08/29/2019   WBCUA 0-5 08/29/2019   LABEPIT 0-10 08/29/2019   BACTERIA None seen 08/29/2019    Pertinent Imaging: CT scan from [facility] on disc 02/2019. CT from [place] on disc July 2021.  Results for orders placed during the hospital encounter of 07/30/19  Abdomen 1 view (KUB)  Narrative CLINICAL DATA:  Nephrolithiasis  EXAM: ABDOMEN - 1 VIEW  COMPARISON:  CT dated March 26, 2019  FINDINGS: The bowel gas pattern is normal. No radio-opaque calculi or other significant radiographic abnormality are seen. The patient is status post prior lumbar fusion. There are few calcifications projecting over the patient's pelvis favored to represent phleboliths.  IMPRESSION: Negative.   Electronically Signed By: Katherine Mantle M.D. On: 07/30/2019 23:56  No results found for this or any previous visit.  No results found for this or any previous visit.  No results found for this or any previous visit.  Results for orders placed during the hospital encounter of  08/25/19  US RENAL  Narrative CLINICAL DATA:  Gross hematuria.  EXAM: RENAL / URINARY TRACT ULTRASOUND COMPLETE  COMPARISON:  None.  FINDINGS: Right Kidney:  Renal measurements: 11.3 cm x 4.1 cm x 5.0 cm = volume: 121.1 mL . Echogenicity within normal limits. No mass or hydronephrosis visualized.  Left Kidney:  Renal measurements: 11.6 cm x 5.3 cm x 5.6 cm = volume: 180.60 mL. Echogenicity within normal limits. No mass or hydronephrosis visualized.  Bladder:  Appears normal for degree of bladder distention.  Other:  None.  IMPRESSION: Normal renal ultrasound.   Electronically Signed By: Aram Candela M.D. On: 08/25/2019 23:22  No results found for this or any previous visit.  No results found for this or any previous visit.  No results found for this or any previous visit.   Assessment & Plan:    Left renal stone now possible left ureteral stone - stone in LLP on Dec 2020 CT may be in left distal ureter on CT Jul 2021. Discussed with patient the nature r/b/a to cystoscopy (she has had microhematuria as well), left retrograde, left ureteroscopy, possible laser lithotripsy and stent. Discussed one of my colleagues would be doing the procedure. Discussed she may have passed the stone and may not have a stone or the calcification may be in a vessel. All questions answered. She elects to proceed.   No follow-ups on file.  Jerilee Field, MD  [facility] [address], [address] [place], [place] [address] [phone]

## 2019-10-01 ENCOUNTER — Other Ambulatory Visit: Payer: Self-pay

## 2019-10-01 DIAGNOSIS — N200 Calculus of kidney: Secondary | ICD-10-CM

## 2019-10-01 NOTE — Addendum Note (Signed)
Addended by: Veneta Penton on: 10/01/2019 04:03 PM   Modules accepted: Orders

## 2019-10-03 ENCOUNTER — Other Ambulatory Visit: Payer: Self-pay

## 2019-10-03 ENCOUNTER — Other Ambulatory Visit: Payer: Medicare PPO

## 2019-10-03 DIAGNOSIS — N200 Calculus of kidney: Secondary | ICD-10-CM

## 2019-10-06 LAB — MICROSCOPIC EXAMINATION

## 2019-10-06 LAB — URINALYSIS, COMPLETE
Bilirubin, UA: NEGATIVE
Glucose, UA: NEGATIVE
Ketones, UA: NEGATIVE
Leukocytes,UA: NEGATIVE
Nitrite, UA: NEGATIVE
Protein,UA: NEGATIVE
Specific Gravity, UA: 1.025 (ref 1.005–1.030)
Urobilinogen, Ur: 0.2 mg/dL (ref 0.2–1.0)
pH, UA: 5.5 (ref 5.0–7.5)

## 2019-10-06 LAB — CULTURE, URINE COMPREHENSIVE

## 2019-10-10 ENCOUNTER — Other Ambulatory Visit: Payer: Self-pay

## 2019-10-10 ENCOUNTER — Encounter
Admission: RE | Admit: 2019-10-10 | Discharge: 2019-10-10 | Disposition: A | Discharge status: Home / Self Care | Payer: Medicare PPO | Source: Ambulatory Visit | Attending: Urology | Admitting: Urology

## 2019-10-10 HISTORY — DX: Other complications of anesthesia, initial encounter: T88.59XA

## 2019-10-10 NOTE — Patient Instructions (Signed)
COVID TESTING Date: October 17, 2019 Testing site:  [facility] [address] Thru Hours:  6:28 am - 1:00 pm Once you are tested, you are asked to stay quarantined (avoiding public places) until after your surgery.   Your procedure is scheduled on: October 21, 2019 TUESDAY Report to Day Surgery on the 2nd floor of the [facility]. To find out your arrival time, please call [phone] between 1PM - 3PM on: Monday October 20, 2019  REMEMBER: Instructions that are not followed completely may result in serious medical risk, up to and including death; or upon the discretion of your surgeon and anesthesiologist your surgery may need to be rescheduled.  Do not eat food after midnight the night before surgery.  No gum chewing, lozengers or hard candies.  You may however, drink CLEAR liquids up to 2 hours before you are scheduled to arrive for your surgery. Do not drink anything within 2 hours of your scheduled arrival time.  Clear liquids include: - water   Do NOT drink anything that is not on this list.  Type 1 and Type 2 diabetics should only drink water.    TAKE THESE MEDICATIONS THE MORNING OF SURGERY WITH A SIP OF WATER: CARVEDILOL CETIRIZINE LEVOTHYROXINE  RABEPRAZOLE (take one the night before and one on the morning of surgery - helps to prevent nausea after surgery.)  Follow recommendations from Cardiologist, Pulmonologist or PCP regarding stopping Aspirin, Coumadin, Plavix, Eliquis, Pradaxa, or Pletal. LAST OF PLAVIX IS October 13, 2019  Stop Anti-inflammatories (NSAIDS) such as Advil, Aleve, Ibuprofen, Motrin, Naproxen, Naprosyn and Aspirin based products such as Excedrin, Goodys Powder, BC Powder. (May take Tylenol or Acetaminophen if needed.)  Stop ANY OVER THE COUNTER supplements until after surgery. (May continue Vitamin D, Vitamin B, and CALCIUM OR multivitamin.)  No Alcohol for 24 hours before or after surgery.  No Smoking including  e-cigarettes for 24 hours prior to surgery.  No chewable tobacco products for at least 6 hours prior to surgery.  No nicotine patches on the day of surgery.  Do not use any "recreational" drugs for at least a week prior to your surgery.  Please be advised that the combination of cocaine and anesthesia may have negative outcomes, up to and including death. If you test positive for cocaine, your surgery will be cancelled.  On the morning of surgery brush your teeth with toothpaste and water, you may rinse your mouth with mouthwash if you wish. Do not swallow any toothpaste or mouthwash.  Do not wear jewelry, make-up, hairpins, clips or nail polish.  Do not wear lotions, powders, or perfumes.   Do not shave 48 hours prior to surgery.   Contact lenses, hearing aids and dentures may not be worn into surgery.  Do not bring valuables to the hospital. [facility] is not responsible for any missing/lost belongings or valuables.   SHOWER THE MORNING OF SURGERY.  Notify your doctor if there is any change in your medical condition (cold, fever, infection).  Wear comfortable clothing (specific to your surgery type) to the hospital.  Plan for stool softeners for home use; pain medications have a tendency to cause constipation. You can also help prevent constipation by eating foods high in fiber such as fruits and vegetables and drinking plenty of fluids as your diet allows.  After surgery, you can help prevent lung complications by doing breathing exercises.  Take deep breaths and cough every 1-2 hours. Your doctor may order a device  called an Facilities manager to help you take deep breaths. When coughing or sneezing, hold a pillow firmly against your incision with both hands. This is called "splinting." Doing this helps protect your incision. It also decreases belly discomfort.   If you are being discharged the day of surgery, you will not be allowed to drive home. You will need a  responsible adult (18 years or older) to drive you home and stay with you that night.   Please call the Pre-admissions Testing Dept. at [phone] if you have any questions about these instructions.  Visitation Policy:  Patients undergoing a surgery or procedure may have one family member or support person with them as long as that person is not COVID-19 positive or experiencing its symptoms.  That person may remain in the waiting area during the procedure.  Children under 41 years of age may have both parents or legal guardians with them during their procedure.  Inpatient Visitation Update:   In an effort to ensure the safety of our team members and our patients, we are implementing a change to our visitation policy:  Effective Monday, Aug. 9, at 7 a.m., inpatients will be allowed one support person.  o The support person may change daily.  o The support person must pass our screening, gel in and out, and wear a mask at all times, including in the patient's room.  o Patients must also wear a mask when staff or their support person are in the room.  o The list of exceptions to the visitation policy remains unchanged.  o Masking is required regardless of vaccination status.  Systemwide, no visitors 17 or younger.

## 2019-10-17 ENCOUNTER — Other Ambulatory Visit: Payer: Self-pay

## 2019-10-17 ENCOUNTER — Other Ambulatory Visit
Admission: RE | Admit: 2019-10-17 | Discharge: 2019-10-17 | Disposition: A | Discharge status: Home / Self Care | Payer: Medicare PPO | Source: Ambulatory Visit | Attending: Urology | Admitting: Urology

## 2019-10-17 DIAGNOSIS — Z20822 Contact with and (suspected) exposure to covid-19: Secondary | ICD-10-CM | POA: Insufficient documentation

## 2019-10-17 DIAGNOSIS — Z01812 Encounter for preprocedural laboratory examination: Secondary | ICD-10-CM | POA: Diagnosis present

## 2019-10-18 LAB — SARS CORONAVIRUS 2 (TAT 6-24 HRS): SARS Coronavirus 2: NEGATIVE

## 2019-10-21 ENCOUNTER — Other Ambulatory Visit: Payer: Self-pay

## 2019-10-21 ENCOUNTER — Ambulatory Visit: Payer: Medicare PPO | Admitting: Urgent Care

## 2019-10-21 ENCOUNTER — Telehealth: Payer: Self-pay | Admitting: Urology

## 2019-10-21 ENCOUNTER — Encounter
Admission: RE | Disposition: A | Discharge status: Home / Self Care | Payer: Self-pay | Source: Home / Self Care | Attending: Urology

## 2019-10-21 ENCOUNTER — Ambulatory Visit
Admission: RE | Admit: 2019-10-21 | Discharge: 2019-10-21 | Disposition: A | Discharge status: Home / Self Care | Payer: Medicare PPO | Attending: Urology | Admitting: Urology

## 2019-10-21 ENCOUNTER — Ambulatory Visit: Payer: Medicare PPO

## 2019-10-21 ENCOUNTER — Encounter: Payer: Self-pay | Admitting: Urology

## 2019-10-21 DIAGNOSIS — Z7902 Long term (current) use of antithrombotics/antiplatelets: Secondary | ICD-10-CM | POA: Diagnosis not present

## 2019-10-21 DIAGNOSIS — Z87891 Personal history of nicotine dependence: Secondary | ICD-10-CM | POA: Insufficient documentation

## 2019-10-21 DIAGNOSIS — G4730 Sleep apnea, unspecified: Secondary | ICD-10-CM | POA: Insufficient documentation

## 2019-10-21 DIAGNOSIS — J449 Chronic obstructive pulmonary disease, unspecified: Secondary | ICD-10-CM | POA: Diagnosis not present

## 2019-10-21 DIAGNOSIS — Z8673 Personal history of transient ischemic attack (TIA), and cerebral infarction without residual deficits: Secondary | ICD-10-CM | POA: Insufficient documentation

## 2019-10-21 DIAGNOSIS — I10 Essential (primary) hypertension: Secondary | ICD-10-CM | POA: Insufficient documentation

## 2019-10-21 DIAGNOSIS — E119 Type 2 diabetes mellitus without complications: Secondary | ICD-10-CM | POA: Insufficient documentation

## 2019-10-21 DIAGNOSIS — N201 Calculus of ureter: Secondary | ICD-10-CM

## 2019-10-21 DIAGNOSIS — Z79890 Hormone replacement therapy (postmenopausal): Secondary | ICD-10-CM | POA: Diagnosis not present

## 2019-10-21 DIAGNOSIS — N200 Calculus of kidney: Secondary | ICD-10-CM

## 2019-10-21 DIAGNOSIS — K219 Gastro-esophageal reflux disease without esophagitis: Secondary | ICD-10-CM | POA: Insufficient documentation

## 2019-10-21 DIAGNOSIS — Z79899 Other long term (current) drug therapy: Secondary | ICD-10-CM | POA: Insufficient documentation

## 2019-10-21 DIAGNOSIS — R3129 Other microscopic hematuria: Secondary | ICD-10-CM

## 2019-10-21 DIAGNOSIS — E039 Hypothyroidism, unspecified: Secondary | ICD-10-CM | POA: Diagnosis not present

## 2019-10-21 HISTORY — PX: CYSTOSCOPY W/ RETROGRADES: SHX1426

## 2019-10-21 HISTORY — PX: CYSTOSCOPY/URETEROSCOPY/HOLMIUM LASER/STENT PLACEMENT: SHX6546

## 2019-10-21 SURGERY — CYSTOSCOPY/URETEROSCOPY/HOLMIUM LASER/STENT PLACEMENT
Anesthesia: General | Laterality: Left

## 2019-10-21 MED ORDER — FAMOTIDINE 20 MG PO TABS
ORAL_TABLET | ORAL | Status: AC
  Filled 2019-10-21: qty 1

## 2019-10-21 MED ORDER — MIDAZOLAM HCL 2 MG/2ML IJ SOLN
INTRAMUSCULAR | Status: AC
  Filled 2019-10-21: qty 2

## 2019-10-21 MED ORDER — TAMSULOSIN HCL 0.4 MG PO CAPS: 0.4000 mg | ORAL_CAPSULE | Freq: Every day | ORAL | 0 refills | Status: AC

## 2019-10-21 MED ORDER — ONDANSETRON HCL 4 MG/2ML IJ SOLN
INTRAMUSCULAR | Status: AC
  Filled 2019-10-21: qty 2

## 2019-10-21 MED ORDER — FENTANYL CITRATE (PF) 100 MCG/2ML IJ SOLN: 25.0000 ug | INTRAMUSCULAR | Status: DC | PRN

## 2019-10-21 MED ORDER — CEFAZOLIN SODIUM-DEXTROSE 2-4 GM/100ML-% IV SOLN
INTRAVENOUS | Status: AC
  Filled 2019-10-21: qty 100

## 2019-10-21 MED ORDER — PROPOFOL 10 MG/ML IV BOLUS
INTRAVENOUS | Status: AC
  Filled 2019-10-21: qty 20

## 2019-10-21 MED ORDER — MIDAZOLAM HCL 2 MG/2ML IJ SOLN
INTRAMUSCULAR | Status: DC | PRN
  Administered 2019-10-21: 2 mg via INTRAVENOUS

## 2019-10-21 MED ORDER — OXYCODONE HCL 5 MG PO TABS: 5.0000 mg | ORAL_TABLET | Freq: Once | ORAL | Status: DC | PRN

## 2019-10-21 MED ORDER — FENTANYL CITRATE (PF) 100 MCG/2ML IJ SOLN
INTRAMUSCULAR | Status: DC | PRN
  Administered 2019-10-21 (×3): 25 ug via INTRAVENOUS

## 2019-10-21 MED ORDER — CEFAZOLIN SODIUM-DEXTROSE 2-4 GM/100ML-% IV SOLN
2.0000 g | INTRAVENOUS | Status: AC
  Administered 2019-10-21: 2 g via INTRAVENOUS

## 2019-10-21 MED ORDER — LIDOCAINE HCL (CARDIAC) PF 100 MG/5ML IV SOSY
PREFILLED_SYRINGE | INTRAVENOUS | Status: DC | PRN
  Administered 2019-10-21: 80 mg via INTRAVENOUS

## 2019-10-21 MED ORDER — FENTANYL CITRATE (PF) 100 MCG/2ML IJ SOLN
INTRAMUSCULAR | Status: AC
  Filled 2019-10-21: qty 2

## 2019-10-21 MED ORDER — PROPOFOL 10 MG/ML IV BOLUS
INTRAVENOUS | Status: DC | PRN
  Administered 2019-10-21: 150 mg via INTRAVENOUS

## 2019-10-21 MED ORDER — OXYBUTYNIN CHLORIDE 5 MG PO TABS
ORAL_TABLET | ORAL | 0 refills | Status: AC
Start: 2019-10-21 — End: ?

## 2019-10-21 MED ORDER — DEXAMETHASONE SODIUM PHOSPHATE 10 MG/ML IJ SOLN
INTRAMUSCULAR | Status: DC | PRN
  Administered 2019-10-21: 5 mg via INTRAVENOUS

## 2019-10-21 MED ORDER — ORAL CARE MOUTH RINSE: 15.0000 mL | Freq: Once | OROMUCOSAL | Status: AC

## 2019-10-21 MED ORDER — LACTATED RINGERS IV SOLN: INTRAVENOUS | Status: DC

## 2019-10-21 MED ORDER — DEXAMETHASONE SODIUM PHOSPHATE 10 MG/ML IJ SOLN
INTRAMUSCULAR | Status: AC
  Filled 2019-10-21: qty 1

## 2019-10-21 MED ORDER — IOPAMIDOL (ISOVUE-200) INJECTION 41%
INTRAVENOUS | Status: DC | PRN
  Administered 2019-10-21: 10 mL

## 2019-10-21 MED ORDER — OXYCODONE HCL 5 MG/5ML PO SOLN: 5.0000 mg | Freq: Once | ORAL | Status: DC | PRN

## 2019-10-21 MED ORDER — LIDOCAINE HCL (PF) 2 % IJ SOLN
INTRAMUSCULAR | Status: AC
  Filled 2019-10-21: qty 5

## 2019-10-21 MED ORDER — HYDROCODONE-ACETAMINOPHEN 5-325 MG PO TABS: 1.0000 | ORAL_TABLET | Freq: Four times a day (QID) | ORAL | 0 refills | Status: AC | PRN

## 2019-10-21 MED ORDER — ONDANSETRON HCL 4 MG/2ML IJ SOLN
INTRAMUSCULAR | Status: DC | PRN
  Administered 2019-10-21: 4 mg via INTRAVENOUS

## 2019-10-21 MED ORDER — CHLORHEXIDINE GLUCONATE 0.12 % MT SOLN
OROMUCOSAL | Status: AC
  Administered 2019-10-21: 15 mL via OROMUCOSAL
  Filled 2019-10-21: qty 15

## 2019-10-21 MED ORDER — CHLORHEXIDINE GLUCONATE 0.12 % MT SOLN: 15.0000 mL | Freq: Once | OROMUCOSAL | Status: AC

## 2019-10-21 SURGICAL SUPPLY — 34 items
BAG DRAIN CYSTO-URO LG1000N (MISCELLANEOUS) ×3 IMPLANT
BASKET ZERO TIP 1.9FR (BASKET) IMPLANT
BRUSH SCRUB EZ  4% CHG (MISCELLANEOUS)
BRUSH SCRUB EZ 1% IODOPHOR (MISCELLANEOUS) ×3 IMPLANT
BRUSH SCRUB EZ 4% CHG (MISCELLANEOUS) IMPLANT
BSKT STON RTRVL ZERO TP 1.9FR (BASKET)
CATH URETL 5X70 OPEN END (CATHETERS) IMPLANT
CNTNR SPEC 2.5X3XGRAD LEK (MISCELLANEOUS)
CONT SPEC 4OZ STER OR WHT (MISCELLANEOUS)
CONT SPEC 4OZ STRL OR WHT (MISCELLANEOUS)
CONTAINER SPEC 2.5X3XGRAD LEK (MISCELLANEOUS) IMPLANT
DRAPE UTILITY 15X26 TOWEL STRL (DRAPES) ×3 IMPLANT
FIBER LASER TRACTIP 200 (UROLOGICAL SUPPLIES) ×3 IMPLANT
GLOVE BIOGEL PI IND STRL 7.5 (GLOVE) ×1 IMPLANT
GLOVE BIOGEL PI INDICATOR 7.5 (GLOVE) ×2
GOWN STRL REUS W/ TWL LRG LVL3 (GOWN DISPOSABLE) ×1 IMPLANT
GOWN STRL REUS W/ TWL XL LVL3 (GOWN DISPOSABLE) ×1 IMPLANT
GOWN STRL REUS W/TWL LRG LVL3 (GOWN DISPOSABLE) ×3
GOWN STRL REUS W/TWL XL LVL3 (GOWN DISPOSABLE) ×6 IMPLANT
GUIDEWIRE STR DUAL SENSOR (WIRE) ×6 IMPLANT
INFUSOR MANOMETER BAG 3000ML (MISCELLANEOUS) ×3 IMPLANT
INTRODUCER DILATOR DOUBLE (INTRODUCER) IMPLANT
KIT TURNOVER CYSTO (KITS) ×3 IMPLANT
PACK CYSTO AR (MISCELLANEOUS) ×3 IMPLANT
SET CYSTO W/LG BORE CLAMP LF (SET/KITS/TRAYS/PACK) ×3 IMPLANT
SHEATH URETERAL 12FRX35CM (MISCELLANEOUS) IMPLANT
SOL .9 NS 3000ML IRR  AL (IV SOLUTION) ×3
SOL .9 NS 3000ML IRR AL (IV SOLUTION) ×1
SOL .9 NS 3000ML IRR UROMATIC (IV SOLUTION) ×1 IMPLANT
STENT URET 6FRX24 CONTOUR (STENTS) IMPLANT
STENT URET 6FRX26 CONTOUR (STENTS) IMPLANT
SURGILUBE 2OZ TUBE FLIPTOP (MISCELLANEOUS) ×3 IMPLANT
VALVE UROSEAL ADJ ENDO (VALVE) IMPLANT
WATER STERILE IRR 1000ML POUR (IV SOLUTION) ×3 IMPLANT

## 2019-10-21 NOTE — Op Note (Signed)
Preoperative diagnosis: Left ureteral calculus  Postoperative diagnosis: Left ureteral calculus  Procedure:  1. Cystoscopy 2. Left ureteroscopy and stone removal 3. Ureteroscopic laser lithotripsy 4. Left ureteral stent placement (6FR/24 cm)  5. Left retrograde pyelography with interpretation  Surgeon: Lorin Picket C. Rafay Dahan, M.D.  Anesthesia: General  Complications: None  Intraoperative findings:  1.  Left retrograde pyelography post procedure showed no evidence of renal calculi.  Mild contrast extravasation noted collecting system most likely secondary to wire trauma  EBL: Minimal  Specimens: 1. Calculus fragments for analysis   Indication: [name] [name] is a 59 y.o. female with a history of an 8 mm left lower pole calculus seen on a CT at [facility] in 2020.  Recent CT performed July 2021 and then will showed no renal calculi however on Dr. Mena Goes review it was felt there was migration of the renal calculus to the left distal ureter. After reviewing the management options for treatment, the patient elected to proceed with the above surgical procedure(s). We have discussed the potential benefits and risks of the procedure, side effects of the proposed treatment, the likelihood of the patient achieving the goals of the procedure, and any potential problems that might occur during the procedure or recuperation. Informed consent has been obtained.  Description of procedure:  The patient was taken to the operating room and general anesthesia was induced.  The patient was placed in the dorsal lithotomy position, prepped and draped in the usual sterile fashion, and preoperative antibiotics were administered. A preoperative time-out was performed.   A 21 French cystoscope was lubricated and passed under direct vision.  Panendoscopy was performed and the bladder mucosa showed no erythema, solid or papillary lesions.  Attention was directed to the left ureteral orifice and a 0.038 Sensor wire  was then advanced up the ureter into the renal pelvis under fluoroscopic guidance.  A 4.5 Fr semirigid ureteroscope was unable to be advanced into the ureter alongside the wire.  A second Sensor wire was placed through the ureteroscope and the scope was advanced over the wire was some narrowing noted in the distal ureter.  Just beyond this narrowing the 8 mm distal calculus was identified. The stone was initially dusted with a 200 micron holmium laser fiber on a setting of 0.2 J and frequency of 20 hz.  Once down to a size <5 mm this calculus was further fragmented at settings of 0.8/8.  All fragments were then removed from the ureter with a zero tip nitinol basket.  Reinspection of the ureter revealed no remaining visible stones or fragments.   Retrograde pyelogram was performed with findings as described above.  The wire was then backloaded through the cystoscope and a 6FR/24 cm Contour ureteral stent was advance over the wire using Seldinger technique. The wire was then removed with an the proximal tip of the stent noted in an upper pole infundibulum which should settle in the renal pelvis.  A good curl was noted in the bladder.    The bladder was then emptied and the procedure ended.  The patient appeared to tolerate the procedure well and without complications.  After anesthetic reversal the patient was transported to the PACU in stable condition.  Plan: She will be scheduled for cystoscopy with stent removal in the office in approximately 1 week   Irineo Axon, MD

## 2019-10-21 NOTE — Discharge Instructions (Addendum)
DISCHARGE INSTRUCTIONS FOR KIDNEY STONE/URETERAL STENT   MEDICATIONS:  1. Resume all your other meds from home.    MAY RESUME ALL MEDICATION, INCLUDING PLAVIX. 2.  AZO (over-the-counter) can help with the burning/stinging when you urinate. 3. Hydrocodone is for moderate/severe pain, Rx was sent to your pharmacy.  ([facility]) 4.  Oxybutynin and tamsulosin are for bladder irritation, Rx sent to pharmacy  ACTIVITY:  1. May resume regular activities in 24 hours. 2. No driving while on narcotic pain medications  3. Drink plenty of water  4. Continue to walk at home - you can still get blood clots when you are at home, so keep active, but don't over do it.  5. May return to work/school tomorrow or when you feel ready   BATHING:  1. You can shower or bathe   SIGNS/SYMPTOMS TO CALL:  Please call us if you have a fever greater than 101.5, uncontrolled nausea/vomiting, uncontrolled pain, dizziness, unable to urinate, bloody urine, chest pain, shortness of breath, leg swelling, leg pain, or any other concerns or questions.   You can reach [place] at [phone].   FOLLOW-UP:  1. You we will be contacted by our office appointment for stent removal 1 week   AMBULATORY SURGERY  DISCHARGE INSTRUCTIONS   1) The drugs that you were given will stay in your system until tomorrow so for the next 24 hours you should not:  A) Drive an automobile B) Make any legal decisions C) Drink any alcoholic beverage   2) You may resume regular meals tomorrow.  Today it is better to start with liquids and gradually work up to solid foods.  You may eat anything you prefer, but it is better to start with liquids, then soup and crackers, and gradually work up to solid foods.   3) Please notify your doctor immediately if you have any unusual bleeding, trouble breathing, redness and pain at the surgery site, drainage, fever, or pain not relieved by medication.    4) Additional  Instructions:        Please contact your physician with any problems or Same Day Surgery at [phone], Monday through Friday 6 am to 4 pm, or [place] at [facility] number at [phone].

## 2019-10-21 NOTE — Telephone Encounter (Signed)
APP MADE COULD NOT REACH PT MB WAS FULL COULD NOT LM MICHELLE

## 2019-10-21 NOTE — Telephone Encounter (Signed)
-----   Message from [name], MD sent at 10/21/2019  8:47 AM EDT ----- Regarding: Cystoscopy/stent removal Please schedule approximately 1 week

## 2019-10-21 NOTE — Transfer of Care (Signed)
Immediate Anesthesia Transfer of Care Note  Patient: [name] [name]  Procedure(s) Performed: CYSTOSCOPY/URETEROSCOPY/HOLMIUM LASER/STENT PLACEMENT (Left ) CYSTOSCOPY WITH RETROGRADE PYELOGRAM (Left )  Patient Location: PACU  Anesthesia Type:General  Level of Consciousness: awake, alert  and oriented  Airway & Oxygen Therapy: Patient Spontanous Breathing and Patient connected to nasal cannula oxygen  Post-op Assessment: Report given to RN and Post -op Vital signs reviewed and stable  Post vital signs: Reviewed and stable  Last Vitals:  Vitals Value Taken Time  BP 114/64 10/21/19 0836  Temp    Pulse 62 10/21/19 0838  Resp 11 10/21/19 0838  SpO2 99 % 10/21/19 0838  Vitals shown include unvalidated device data.  Last Pain:  Vitals:   10/21/19 0836  TempSrc:   PainSc: (P) Asleep         Complications: No complications documented.

## 2019-10-21 NOTE — Anesthesia Preprocedure Evaluation (Addendum)
Anesthesia Evaluation  Patient identified by MRN, date of birth, ID band Patient awake    Reviewed: Allergy & Precautions, H&P , NPO status , Patient's Chart, lab work & pertinent test results  History of Anesthesia Complications (+) DIFFICULT AIRWAY and history of anesthetic complications  Airway Mallampati: III  TM Distance: <3 FB Neck ROM: limited    Dental  (+) Chipped, Poor Dentition   Pulmonary neg shortness of breath, sleep apnea , COPD, former smoker,    Pulmonary exam normal        Cardiovascular Exercise Tolerance: Good hypertension, (-) angina(-) Past MI and (-) DOE Normal cardiovascular exam     Neuro/Psych  Headaches, TIACVA (right side), Residual Symptoms    GI/Hepatic GERD  ,(+) Cirrhosis       ,   Endo/Other  diabetesHypothyroidism   Renal/GU Renal disease     Musculoskeletal   Abdominal   Peds  Hematology negative hematology ROS (+)   Anesthesia Other Findings Past Medical History: No date: Allergy to Coumadin     Comment:  Not an allergy but pt declines Coumadin No date: Complication of anesthesia     Comment:  difficulty breathing No date: Diverticulitis     Comment:  August, 2013 No date: Ejection fraction     Comment:  EF normal by bedside echo per Dr. 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, acquired, autoimmune No date: Kidney stones No date: Memory problem No date: Migraine headache     Comment:  Versus TIAs No date: Patent foramen ovale     Comment:  Status post device closure  at [facility]  25               mmGore Helix occluder device No date: Pre-diabetes No date: Stroke [facility])     Comment:  Silent right brain stroke/cryptogenic stroke No date:  Thyroid disease     Comment:  Hypothyroidism, post-radiation No date: Thyroiditis, autoimmune No date: Tobacco abuse No date: Urinary calculus No date: Vertigo No date: Visual changes  Past Surgical History: 02/2019: APPENDECTOMY No date: BACK SURGERY     Comment:  X2 RODS IN BACK DURING SECOND SURGERY 01/01/2017: COLONOSCOPY WITH PROPOFOL; N/A     Comment:  Procedure: COLONOSCOPY WITH PROPOFOL;  Surgeon: [name], MD;  Location: [facility] ENDOSCOPY;  Service:               Endoscopy;  Laterality: N/A; 01/01/2017: ESOPHAGOGASTRODUODENOSCOPY (EGD) WITH PROPOFOL; N/A     Comment:  Procedure: ESOPHAGOGASTRODUODENOSCOPY (EGD) WITH               PROPOFOL;  Surgeon: [name], MD;  Location:               [facility] ENDOSCOPY;  Service: Endoscopy;  Laterality: N/A; 05/25/2006: I-131 TREATMENT No date: PATENT FORAMEN OVALE(PFO) CLOSURE No date: polyp removal No date: TONSILLECTOMY No date: TONSILLECTOMY AND ADENOIDECTOMY 1997: TOTAL ABDOMINAL HYSTERECTOMY     Comment:  No bso  BMI    Body Mass Index: 37.49 kg/m      Reproductive/Obstetrics negative OB ROS  Anesthesia Physical Anesthesia Plan  ASA: III  Anesthesia Plan: General LMA   Post-op Pain Management:    Induction: Intravenous  PONV Risk Score and Plan:   Airway Management Planned: LMA  Additional Equipment:   Intra-op Plan:   Post-operative Plan: Extubation in OR  Informed Consent: I have reviewed the patients History and Physical, chart, labs and discussed the procedure including the risks, benefits and alternatives for the proposed anesthesia with the patient or authorized representative who has indicated his/her understanding and acceptance.     Dental Advisory Given  Plan Discussed with: Anesthesiologist, CRNA and Surgeon  Anesthesia Plan Comments: (Patient consented for risks of anesthesia including but not limited to:  - adverse  reactions to medications - damage to eyes, teeth, lips or other oral mucosa - nerve damage due to positioning  - sore throat or hoarseness - Damage to heart, brain, nerves, lungs, other parts of body or loss of life  Patient voiced understanding.)       Anesthesia Quick Evaluation

## 2019-10-21 NOTE — Anesthesia Postprocedure Evaluation (Signed)
Anesthesia Post Note  Patient: [name] [name]  Procedure(s) Performed: CYSTOSCOPY/URETEROSCOPY/HOLMIUM LASER/STENT PLACEMENT (Left ) CYSTOSCOPY WITH RETROGRADE PYELOGRAM (Left )  Patient location during evaluation: PACU Anesthesia Type: General Level of consciousness: awake and alert Pain management: pain level controlled Vital Signs Assessment: post-procedure vital signs reviewed and stable Respiratory status: spontaneous breathing, nonlabored ventilation, respiratory function stable and patient connected to nasal cannula oxygen Cardiovascular status: blood pressure returned to baseline and stable Postop Assessment: no apparent nausea or vomiting Anesthetic complications: no   No complications documented.   Last Vitals:  Vitals:   10/21/19 0906 10/21/19 0921  BP: 101/68 122/71  Pulse: 64 (!) 54  Resp: 10 14  Temp:  (!) 36.1 C  SpO2: 96% 98%    Last Pain:  Vitals:   10/21/19 0921  TempSrc:   PainSc: 0-No pain                 [name] [name]

## 2019-10-21 NOTE — OR Nursing (Signed)
Per Dr. Virl Diamond, pt may resume all her medications, including plavix.  Patient notified of same.

## 2019-10-21 NOTE — Interval H&P Note (Signed)
History and Physical Interval Note: Reviewed Dr. Mena Goes note, 8 mm left renal calculus not seen on recent imaging and may be in the left distal ureter.  All questions were answered  CV: RRR Lungs: Clear  10/21/2019 7:22 AM  [name] [name]  has presented today for surgery, with the diagnosis of left nephrolithiasis, microhematuria.  The various methods of treatment have been discussed with the patient and family. After consideration of risks, benefits and other options for treatment, the patient has consented to  Procedure(s): CYSTOSCOPY/URETEROSCOPY/HOLMIUM LASER/STENT PLACEMENT (Left) CYSTOSCOPY WITH RETROGRADE PYELOGRAM (Left) as a surgical intervention.  The patient's history has been reviewed, patient examined, no change in status, stable for surgery.  I have reviewed the patient's chart and labs.  Questions were answered to the patient's satisfaction.     Sabah Zucco C Kale Rondeau

## 2019-10-22 ENCOUNTER — Encounter: Payer: Self-pay | Admitting: Urology

## 2019-10-23 ENCOUNTER — Telehealth (INDEPENDENT_AMBULATORY_CARE_PROVIDER_SITE_OTHER): Payer: Self-pay | Admitting: *Deleted

## 2019-10-23 DIAGNOSIS — E063 Autoimmune thyroiditis: Secondary | ICD-10-CM

## 2019-10-23 DIAGNOSIS — R7303 Prediabetes: Secondary | ICD-10-CM

## 2019-10-23 NOTE — Telephone Encounter (Signed)
Spoke to patient, advised per Dr. Fransico Michael:  Thyroid tests were high-normal. Take one 112 mcg pill daily and add only one extra half-pill per week on one day per week.    She voiced understanding of the new dosing.  Labs placed in portal for [facility].

## 2019-10-24 ENCOUNTER — Encounter: Payer: Self-pay | Admitting: Emergency Medicine

## 2019-10-24 ENCOUNTER — Other Ambulatory Visit: Payer: Self-pay

## 2019-10-24 ENCOUNTER — Emergency Department: Payer: Medicare PPO

## 2019-10-24 DIAGNOSIS — I10 Essential (primary) hypertension: Secondary | ICD-10-CM | POA: Insufficient documentation

## 2019-10-24 DIAGNOSIS — Z87891 Personal history of nicotine dependence: Secondary | ICD-10-CM | POA: Diagnosis not present

## 2019-10-24 DIAGNOSIS — E039 Hypothyroidism, unspecified: Secondary | ICD-10-CM | POA: Diagnosis not present

## 2019-10-24 DIAGNOSIS — R39198 Other difficulties with micturition: Secondary | ICD-10-CM | POA: Diagnosis present

## 2019-10-24 DIAGNOSIS — R339 Retention of urine, unspecified: Secondary | ICD-10-CM | POA: Insufficient documentation

## 2019-10-24 LAB — BASIC METABOLIC PANEL
Anion gap: 11 (ref 5–15)
BUN: 24 mg/dL — ABNORMAL HIGH (ref 6–20)
CO2: 27 mmol/L (ref 22–32)
Calcium: 9.6 mg/dL (ref 8.9–10.3)
Chloride: 101 mmol/L (ref 98–111)
Creatinine, Ser: 1.12 mg/dL — ABNORMAL HIGH (ref 0.44–1.00)
GFR calc Af Amer: >60 mL/min (ref >60)
GFR calc non Af Amer: 53 mL/min — ABNORMAL LOW (ref >60)
Glucose, Bld: 106 mg/dL — ABNORMAL HIGH (ref 70–99)
Potassium: 4.7 mmol/L (ref 3.5–5.1)
Sodium: 139 mmol/L (ref 135–145)

## 2019-10-24 LAB — CBC
HCT: 37 % (ref 36.0–46.0)
Hemoglobin: 12.8 g/dL (ref 12.0–15.0)
MCH: 31.8 pg (ref 26.0–34.0)
MCHC: 34.6 g/dL (ref 30.0–36.0)
MCV: 92 fL (ref 80.0–100.0)
Platelets: 277 10*3/uL (ref 150–400)
RBC: 4.02 MIL/uL (ref 3.87–5.11)
RDW: 13.4 % (ref 11.5–15.5)
WBC: 10.2 10*3/uL (ref 4.0–10.5)
nRBC: 0 % (ref 0.0–0.2)

## 2019-10-24 MED ORDER — SODIUM CHLORIDE 0.9 % IV BOLUS
1000.0000 mL | Freq: Once | INTRAVENOUS | Status: AC
  Administered 2019-10-24: 1000 mL via INTRAVENOUS

## 2019-10-24 MED ORDER — MORPHINE SULFATE (PF) 4 MG/ML IV SOLN
4.0000 mg | Freq: Once | INTRAVENOUS | Status: AC
  Administered 2019-10-24: 4 mg via INTRAVENOUS
  Filled 2019-10-24: qty 1

## 2019-10-24 MED ORDER — ONDANSETRON HCL 4 MG/2ML IJ SOLN
4.0000 mg | Freq: Once | INTRAMUSCULAR | Status: AC
  Administered 2019-10-24: 4 mg via INTRAVENOUS
  Filled 2019-10-24: qty 2

## 2019-10-24 NOTE — ED Notes (Signed)
Updated pt's sister Corrie Dandy [phone]

## 2019-10-24 NOTE — ED Triage Notes (Signed)
Pt reports she had stent placement to left kidney on Tuesday, pt reports she has pressure to bladder, reports has been taking AZO urine color orange per pt. Pt reports feels nausea due to pain.

## 2019-10-25 ENCOUNTER — Emergency Department
Admission: EM | Admit: 2019-10-25 | Discharge: 2019-10-25 | Disposition: A | Discharge status: Home / Self Care | Payer: Medicare PPO | Attending: Emergency Medicine | Admitting: Emergency Medicine

## 2019-10-25 DIAGNOSIS — R339 Retention of urine, unspecified: Secondary | ICD-10-CM

## 2019-10-25 LAB — URINALYSIS, COMPLETE (UACMP) WITH MICROSCOPIC
Bilirubin Urine: NEGATIVE
Glucose, UA: NEGATIVE mg/dL
Ketones, ur: NEGATIVE mg/dL
Nitrite: NEGATIVE
Protein, ur: 100 mg/dL — AB
RBC / HPF: >50 RBC/hpf — ABNORMAL HIGH (ref 0–5)
Specific Gravity, Urine: 1.008 (ref 1.005–1.030)
Squamous Epithelial / HPF: NONE SEEN (ref 0–5)
pH: 6 (ref 5.0–8.0)

## 2019-10-25 MED ORDER — CEPHALEXIN 500 MG PO CAPS
500.0000 mg | ORAL_CAPSULE | Freq: Once | ORAL | Status: AC
  Administered 2019-10-25: 500 mg via ORAL
  Filled 2019-10-25: qty 1

## 2019-10-25 MED ORDER — CEPHALEXIN 500 MG PO CAPS: 500.0000 mg | ORAL_CAPSULE | Freq: Three times a day (TID) | ORAL | 0 refills | Status: DC

## 2019-10-25 MED ORDER — CEPHALEXIN 500 MG PO CAPS: 500.0000 mg | ORAL_CAPSULE | Freq: Three times a day (TID) | ORAL | 0 refills | Status: AC

## 2019-10-25 NOTE — ED Provider Notes (Signed)
[facility] Emergency Department Provider Note   ____________________________________________    I have reviewed the triage vital signs and the nursing notes.   HISTORY  Chief Complaint Flank Pain     HPI [name] [name] is a 59 y.o. female with history as noted below who presents with complaints of pressure to her bladder and some dysuria and difficulty urinating.  She recently had a stent placed in her left ureter last week, this has been causing her discomfort for most of the week.  Over the last 24 hours she has had some difficulty urinating but is able to urinate.  She is concerned that she may be developing a urinary tract infection, she is on Azo.  No fevers chills nausea or vomiting.  Past Medical History:  Diagnosis Date  . Allergy to Coumadin    Not an allergy but pt declines Coumadin  . Complication of anesthesia    difficulty breathing  . Diverticulitis    August, 2013  . Ejection fraction    EF normal by bedside echo per Dr. Andee Lineman, office visit, July, 2012  . Fatigue   . Galactorrhea syndrome   . GERD (gastroesophageal reflux disease)   . Hemiparesis, right (HCC)   . History of kidney stones   . Hypertension   . Hypothyroidism, acquired, autoimmune   . Kidney stones   . Memory problem   . Migraine headache    Versus TIAs  . Patent foramen ovale    Status post device closure  at [facility]  25 mmGore Helix occluder device  . Pre-diabetes   . Stroke [facility])    Silent right brain stroke/cryptogenic stroke  . Thyroid disease    Hypothyroidism, post-radiation  . Thyroiditis, autoimmune   . Tobacco abuse   . Urinary calculus   . Vertigo   . Visual changes     Patient Active Problem List   Diagnosis Date Noted  . Acute perforated appendicitis 02/25/2019  . S/P appendectomy 02/25/2019  . Chronic diarrhea 07/05/2016  . Diverticulosis of large intestine 09/27/2015  . Serrated polyp of colon 09/27/2015  . Dyspepsia  07/13/2013  . Smoking 07/13/2013  . Diarrhea 07/13/2013  . 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 Goiter 06/25/2012  . Hypercholesterolemia 02/25/2012  . Diverticulitis   . Galactorrhea syndrome   . Urinary calculus   . Thyroid disease   . Vertigo   . Hypertension   . GERD (gastroesophageal reflux disease)   . Stroke (HCC)   . Patent foramen ovale   . Migraine headache   . Visual changes   . Allergy to Coumadin   . Thyroiditis, autoimmune   . Hemiparesis, right (HCC)   . Memory problem   . Ejection fraction   . Status post percutaneous patent foramen ovale closure 03/28/2011  . Postlaminectomy syndrome, lumbar region 03/08/2006  . Displacement of lumbar intervertebral disc without myelopathy 10/24/2005  . Back pain 08/23/2005  . Contusion of back 08/23/2005  . Degeneration of lumbar or lumbosacral intervertebral disc 08/23/2005  . Osteoarthrosis, unspecified whether generalized or localized, other specified sites 08/23/2005  . Sprain of lumbar region 08/23/2005    Past Surgical History:  Procedure Laterality Date  . APPENDECTOMY  02/2019  . BACK SURGERY     X2 RODS IN BACK DURING SECOND SURGERY  . COLONOSCOPY WITH PROPOFOL N/A 01/01/2017   Procedure: COLONOSCOPY WITH PROPOFOL;  Surgeon: [name], MD;  Location: [facility] ENDOSCOPY;  Service: Endoscopy;  Laterality: N/A;  .  CYSTOSCOPY W/ RETROGRADES Left 10/21/2019   Procedure: CYSTOSCOPY WITH RETROGRADE PYELOGRAM;  Surgeon: [name], MD;  Location: [facility] ORS;  Service: Urology;  Laterality: Left;  . CYSTOSCOPY/URETEROSCOPY/HOLMIUM LASER/STENT PLACEMENT Left 10/21/2019   Procedure: CYSTOSCOPY/URETEROSCOPY/HOLMIUM LASER/STENT PLACEMENT;  Surgeon: [name], MD;  Location: [facility] ORS;  Service: Urology;  Laterality: Left;  . ESOPHAGOGASTRODUODENOSCOPY (EGD) WITH PROPOFOL N/A 01/01/2017   Procedure: ESOPHAGOGASTRODUODENOSCOPY (EGD) WITH PROPOFOL;  Surgeon: [name], MD;  Location: [facility]  ENDOSCOPY;  Service: Endoscopy;  Laterality: N/A;  . I-131 TREATMENT  05/25/2006  . PATENT FORAMEN OVALE(PFO) CLOSURE    . polyp removal    . TONSILLECTOMY    . TONSILLECTOMY AND ADENOIDECTOMY    . TOTAL ABDOMINAL HYSTERECTOMY  1997   No bso    Prior to Admission medications   Medication Sig Start Date End Date Taking? Authorizing Provider  CALCIUM-VITAMIN D PO Take 1 tablet by mouth daily.    [provider]  carvedilol (COREG) 12.5 MG tablet Take 12.5 mg by mouth 2 (two) times daily.      [provider]  cephALEXin (KEFLEX) 500 MG capsule Take 1 capsule (500 mg total) by mouth 3 (three) times daily for 7 days. 10/25/19 11/01/19  Jene Every, MD  cetirizine (ZYRTEC) 10 MG tablet Take 10 mg by mouth daily.    [provider]  CHOLECALCIFEROL PO Take 2 each by mouth daily. Liquid drops    [provider]  clopidogrel (PLAVIX) 75 MG tablet Take 75 mg by mouth daily.    [provider]  HYDROcodone-acetaminophen (NORCO/VICODIN) 5-325 MG tablet Take 1 tablet by mouth every 6 (six) hours as needed for moderate pain. 10/21/19   Stoioff, Verna Czech, MD  levothyroxine (SYNTHROID) 112 MCG tablet Take 1 Tablet by mouth once daily before breakfast and one additional half pill, twice a week on Wednesdays and Sundays Patient taking differently: Take 112-168 mcg by mouth See admin instructions. Take 1 Tablet by mouth once daily before breakfast and one additional half pill, twice a week on Wednesdays and Sundays 07/17/19   David Stall, MD  LUTEIN PO Take 1 tablet by mouth daily.     [provider]  methocarbamol (ROBAXIN) 500 MG tablet Take 500 mg by mouth every 8 (eight) hours as needed for muscle spasms.     [provider]  Multiple Vitamin (MULTI-VITAMINS) TABS Take 1 tablet by mouth daily.     [provider]  oxybutynin (DITROPAN) 5 MG tablet 1 tab tid prn frequency,urgency, bladder spasm 10/21/19   Stoioff, Verna Czech, MD    RABEprazole (ACIPHEX) 20 MG tablet Take 1 tablet by mouth once daily Patient taking differently: Take 20 mg by mouth daily as needed (acid reflux).  09/08/19   David Stall, MD  tamsulosin [facility]) 0.4 MG CAPS capsule Take 1 capsule (0.4 mg total) by mouth daily after breakfast. 10/21/19   Stoioff, Verna Czech, MD     Allergies Crestor [rosuvastatin calcium], Cymbalta [duloxetine hcl], Doxycycline, and Metronidazole  Family History  Problem Relation Age of Onset  . Stroke Other   . Hypertension Other   . Diabetes Mother   . Diabetes Father   . Thyroid disease Maternal Aunt   . Diabetes Maternal Grandmother   . Diabetes Paternal Grandmother   . Cancer Paternal Grandmother     Social History Social History   Tobacco Use  . Smoking status: Former Smoker    Packs/day: 1.00    Years: 35.00  Pack years: 35.00    Types: Cigarettes  . Smokeless tobacco: Never Used  . Tobacco comment: Stopped smoking in Dec 2020  Vaping Use  . Vaping Use: Never used  Substance Use Topics  . Alcohol use: Yes    Alcohol/week: 0.0 standard drinks    Comment: Rarely  . Drug use: No    Review of Systems  Constitutional: No fever/chills Eyes: No visual changes.  ENT: No sore throat. Cardiovascular: Denies chest pain. Respiratory: Denies shortness of breath. Gastrointestinal: As above Genitourinary: As above Musculoskeletal: Negative for back pain. Skin: Negative for rash. Neurological: Negative for headaches or weakness   ____________________________________________   PHYSICAL EXAM:  VITAL SIGNS: ED Triage Vitals  Enc Vitals Group     BP 10/24/19 2208 (!) 170/93     Pulse Rate 10/24/19 2208 75     Resp 10/24/19 2208 (!) 22     Temp 10/24/19 2208 99 F (37.2 C)     Temp Source 10/24/19 2208 Oral     SpO2 10/24/19 2208 98 %     Weight 10/24/19 2209 90.7 kg (200 lb)     Height 10/24/19 2209 1.549 m (5\' 1" )     Head Circumference --      Peak Flow --      Pain Score 10/24/19  2209 10     Pain Loc --      Pain Edu? --      Excl. in GC? --     Constitutional: Alert and oriented.   Nose: No congestion/rhinnorhea. Mouth/Throat: Mucous membranes are moist.    Cardiovascular: Normal rate, regular rhythm. Grossly normal heart sounds.  Good peripheral circulation. Respiratory: Normal respiratory effort.  No retractions. Lungs CTAB. Gastrointestinal: Soft and nontender. No distention.  No CVA tenderness.  Reassuring exam  Musculoskeletal Warm and well perfused Neurologic:  Normal speech and language. No gross focal neurologic deficits are appreciated.  Skin:  Skin is warm, dry and intact. No rash noted. Psychiatric: Mood and affect are normal. Speech and behavior are normal.  ____________________________________________   LABS (all labs ordered are listed, but only abnormal results are displayed)  Labs Reviewed  URINALYSIS, COMPLETE (UACMP) WITH MICROSCOPIC - Abnormal; Notable for the following components:      Result Value   Color, Urine YELLOW (*)    APPearance HAZY (*)    Hgb urine dipstick LARGE (*)    Protein, ur 100 (*)    Leukocytes,Ua TRACE (*)    RBC / HPF >50 (*)    Bacteria, UA RARE (*)    All other components within normal limits  BASIC METABOLIC PANEL - Abnormal; Notable for the following components:   Glucose, Bld 106 (*)    BUN 24 (*)    Creatinine, Ser 1.12 (*)    GFR calc non Af Amer 53 (*)    All other components within normal limits  URINE CULTURE  CBC   ____________________________________________  EKG  None ____________________________________________  RADIOLOGY  KUB demonstrates left ureteral stent is in place ____________________________________________   PROCEDURES  Procedure(s) performed: No  Procedures   Critical Care performed: No ____________________________________________   INITIAL IMPRESSION / ASSESSMENT AND PLAN / ED COURSE  Pertinent labs & imaging results that were available during my care of  the patient were reviewed by me and considered in my medical decision making (see chart for details).  Patient with ureteral stent placed last week presents with complaints of urinary pressure some difficulty urinating.  Overall her exam is  reassuring.  Lab work is notable only for mild elevation of BUN to creatinine ratio, normal white blood cell count.  Afebrile.  No tachycardia.  KUB demonstrates stent is in place.  Urinalysis demonstrates hemoglobin trace leukocytes 21-50 white blood cells and rare bacteria, urinary culture added on, this is a borderline urinalysis but given her symptoms will start Keflex, have asked her to follow-up closely with her urologist.    ____________________________________________   FINAL CLINICAL IMPRESSION(S) / ED DIAGNOSES  Final diagnoses:  Urinary retention        Note:  This document was prepared using Dragon voice recognition software and may include unintentional dictation errors.   Jene EveryKinner, Emmah Bratcher, MD 10/25/19 [phone]

## 2019-10-25 NOTE — ED Notes (Signed)
Patient assisted to the restroom via wheelchair. Patient states she just woke up. Was resting well in subwait. Will continue to monitor.

## 2019-10-27 ENCOUNTER — Telehealth: Payer: Self-pay | Admitting: Radiology

## 2019-10-27 LAB — CALCULI, WITH PHOTOGRAPH (CLINICAL LAB)
Calcium Oxalate Dihydrate: 40 %
Calcium Oxalate Monohydrate: 50 %
Hydroxyapatite: 10 %
Weight Calculi: 14 mg

## 2019-10-27 NOTE — Telephone Encounter (Signed)
Patient was seen in the ER over the weekend for urinary retention and feels she was exposed to covid while there. Advised patient to be tested prior to appointment on 10/29/2019. Patient expresses understanding.

## 2019-10-28 LAB — URINE CULTURE: Culture: 60000 — AB

## 2019-10-29 ENCOUNTER — Ambulatory Visit (INDEPENDENT_AMBULATORY_CARE_PROVIDER_SITE_OTHER): Payer: Medicare PPO | Admitting: Urology

## 2019-10-29 ENCOUNTER — Encounter: Payer: Self-pay | Admitting: Urology

## 2019-10-29 ENCOUNTER — Other Ambulatory Visit: Payer: Self-pay

## 2019-10-29 VITALS — BP 131/75 | HR 71 | Ht 61.0 in | Wt 200.0 lb

## 2019-10-29 DIAGNOSIS — N200 Calculus of kidney: Secondary | ICD-10-CM

## 2019-10-29 MED ORDER — AMOXICILLIN 875 MG PO TABS
875.0000 mg | ORAL_TABLET | Freq: Two times a day (BID) | ORAL | 0 refills | Status: AC
Start: 2019-10-29 — End: 2019-11-03

## 2019-10-29 NOTE — Progress Notes (Signed)
Indications: Patient is 59 y.o., female status post ureteroscopic removal of an 8 mm left distal ureteral calculus on 10/21/2019.  She presented to the ED on 10/25/2019 complaining of flank pain, bladder pressure and dysuria as well as difficulty voiding.  She was afebrile and had no nausea or vomiting.  She was started on Keflex.  A urine culture was ordered which grew 60,000 colonies of Enterococcus however her antibiotic was not changed.  She is still having moderate stent symptoms.  The patient is presenting today for stent removal.  Procedure:  Flexible Cystoscopy with stent removal (97673)  Timeout was performed and the correct patient, procedure and participants were identified.    Description:  The patient was prepped and draped in the usual sterile fashion. Flexible cystosopy was performed.  The stent was visualized, grasped, and removed intact without difficulty. The patient tolerated the procedure well.  A single dose of oral antibiotics was given.  Complications:  None  Plan:   Stone analysis mixed calcium oxalate with 10% hydroxyapatite  Her symptoms precipitating ED visit most likely stent induced and Enterococcus most likely represents colonization  Since cephalosporin for Enterococcus is not good we will switch to amoxicillin x5 days  She has follow-up scheduled 9/24 with KUB  Instructed to call for fever or flank pain post stent removal  Irineo Axon, MD

## 2019-10-29 NOTE — Consult Note (Signed)
Spoke with patient. She states that her doctor prescribe amoxacillin for the enterococcus in the Ucx. Informed patient to complete the abx as prescribed by her doctor. Case was discussed with Dr. Cyril Loosen. No abx were called in since pt is on the correct abx at the time of phone call.   Thanks,   Paschal Dopp, PharmD, BCPS

## 2019-10-30 LAB — MICROSCOPIC EXAMINATION: RBC, Urine: >30 /hpf — AB (ref 0–2)

## 2019-10-30 LAB — URINALYSIS, COMPLETE
Bilirubin, UA: NEGATIVE
Glucose, UA: NEGATIVE
Ketones, UA: NEGATIVE
Nitrite, UA: NEGATIVE
Specific Gravity, UA: 1.025 (ref 1.005–1.030)
Urobilinogen, Ur: 0.2 mg/dL (ref 0.2–1.0)
pH, UA: 6 (ref 5.0–7.5)

## 2019-11-04 ENCOUNTER — Encounter: Payer: Self-pay | Admitting: Urology

## 2019-11-28 ENCOUNTER — Ambulatory Visit: Payer: Medicare PPO | Admitting: Urology

## 2019-12-10 ENCOUNTER — Ambulatory Visit: Payer: Medicare PPO | Admitting: Urology

## 2019-12-10 ENCOUNTER — Other Ambulatory Visit: Payer: Self-pay

## 2019-12-10 ENCOUNTER — Encounter: Payer: Self-pay | Admitting: Urology

## 2019-12-10 ENCOUNTER — Ambulatory Visit: Payer: Self-pay | Admitting: Urology

## 2019-12-10 VITALS — BP 143/77 | HR 68 | Ht 61.0 in | Wt 200.0 lb

## 2019-12-10 DIAGNOSIS — N200 Calculus of kidney: Secondary | ICD-10-CM

## 2019-12-10 NOTE — Progress Notes (Signed)
12/10/2019 1:40 PM   CHARLYN VIALPANDO 1960-11-30 [id number]  Referring provider: [name], MD [address] [place],  [place] [address]  Chief Complaint  Patient presents with  . Other    HPI: 59 y.o. female presents for postop follow-up.   Ureteroscopic removal of an 8 mm left distal ureteral calculus 10/21/2019  Stent removed 10/29/2019  No problems after stent removal  Stone analysis monohydrate/dihydrate/hydroxyapatite 50/40/10  Postop ultrasound performed in [place] 12/01/2019 showed no hydronephrosis and a possible 3 mm right renal calculus  Denies flank, abdominal or pelvic pain   PMH: Past Medical History:  Diagnosis Date  . Allergy to Coumadin    Not an allergy but pt declines Coumadin  . Complication of anesthesia    difficulty breathing  . Diverticulitis    August, 2013  . Ejection fraction    EF normal by bedside echo per Dr. Andee Lineman, office visit, July, 2012  . Fatigue   . Galactorrhea syndrome   . GERD (gastroesophageal reflux disease)   . Hemiparesis, right (HCC)   . History of kidney stones   . Hypertension   . Hypothyroidism, acquired, autoimmune   . Kidney stones   . Memory problem   . Migraine headache    Versus TIAs  . Patent foramen ovale    Status post device closure  at [facility]  25 mmGore Helix occluder device  . Pre-diabetes   . Stroke [facility])    Silent right brain stroke/cryptogenic stroke  . Thyroid disease    Hypothyroidism, post-radiation  . Thyroiditis, autoimmune   . Tobacco abuse   . Urinary calculus   . Vertigo   . Visual changes     Surgical History: Past Surgical History:  Procedure Laterality Date  . APPENDECTOMY  02/2019  . BACK SURGERY     X2 RODS IN BACK DURING SECOND SURGERY  . COLONOSCOPY WITH PROPOFOL N/A 01/01/2017   Procedure: COLONOSCOPY WITH PROPOFOL;  Surgeon: [name], MD;  Location: [facility] ENDOSCOPY;  Service: Endoscopy;  Laterality: N/A;  . CYSTOSCOPY W/ RETROGRADES  Left 10/21/2019   Procedure: CYSTOSCOPY WITH RETROGRADE PYELOGRAM;  Surgeon: [name], MD;  Location: [facility] ORS;  Service: Urology;  Laterality: Left;  . CYSTOSCOPY/URETEROSCOPY/HOLMIUM LASER/STENT PLACEMENT Left 10/21/2019   Procedure: CYSTOSCOPY/URETEROSCOPY/HOLMIUM LASER/STENT PLACEMENT;  Surgeon: [name], MD;  Location: [facility] ORS;  Service: Urology;  Laterality: Left;  . ESOPHAGOGASTRODUODENOSCOPY (EGD) WITH PROPOFOL N/A 01/01/2017   Procedure: ESOPHAGOGASTRODUODENOSCOPY (EGD) WITH PROPOFOL;  Surgeon: [name], MD;  Location: [facility] ENDOSCOPY;  Service: Endoscopy;  Laterality: N/A;  . I-131 TREATMENT  05/25/2006  . PATENT FORAMEN OVALE(PFO) CLOSURE    . polyp removal    . TONSILLECTOMY    . TONSILLECTOMY AND ADENOIDECTOMY    . TOTAL ABDOMINAL HYSTERECTOMY  1997   No bso    Home Medications:  Allergies as of 12/10/2019      Reactions   Crestor [rosuvastatin Calcium] Other (See Comments)   Muscle cramps.   Cymbalta [duloxetine Hcl] Diarrhea   Doxycycline    REACTION: Facial numbness, Nausea   Metronidazole    REACTION: "blood vessels come up to top of skin"      Medication List       Accurate as of December 10, 2019  1:40 PM. If you have any questions, ask your nurse or doctor.        CALCIUM-VITAMIN D PO Take 1 tablet by mouth daily.   carvedilol 12.5 MG tablet Commonly known as:  COREG Take 12.5 mg by mouth 2 (two) times daily.   cetirizine 10 MG tablet Commonly known as: ZYRTEC Take 10 mg by mouth daily.   CHOLECALCIFEROL PO Take 2 each by mouth daily. Liquid drops   clopidogrel 75 MG tablet Commonly known as: PLAVIX Take 75 mg by mouth daily.   HYDROcodone-acetaminophen 5-325 MG tablet Commonly known as: NORCO/VICODIN Take 1 tablet by mouth every 6 (six) hours as needed for moderate pain.   levothyroxine 112 MCG tablet Commonly known as: SYNTHROID Take 1 Tablet by mouth once daily before breakfast and one additional half pill, twice a  week on Wednesdays and Sundays What changed:   how much to take  how to take this  when to take this   LUTEIN PO Take 1 tablet by mouth daily.   methocarbamol 500 MG tablet Commonly known as: ROBAXIN Take 500 mg by mouth every 8 (eight) hours as needed for muscle spasms.   Multi-Vitamins Tabs Take 1 tablet by mouth daily.   oxybutynin 5 MG tablet Commonly known as: DITROPAN 1 tab tid prn frequency,urgency, bladder spasm   RABEprazole 20 MG tablet Commonly known as: ACIPHEX Take 1 tablet by mouth once daily What changed:   how much to take  how to take this  when to take this  reasons to take this  additional instructions   tamsulosin 0.4 MG Caps capsule Commonly known as: FLOMAX Take 1 capsule (0.4 mg total) by mouth daily after breakfast.       Allergies:  Allergies  Allergen Reactions  . Crestor [Rosuvastatin Calcium] Other (See Comments)    Muscle cramps.  Marland Kitchen Cymbalta [Duloxetine Hcl] Diarrhea  . Doxycycline     REACTION: Facial numbness, Nausea  . Metronidazole     REACTION: "blood vessels come up to top of skin"    Family History: Family History  Problem Relation Age of Onset  . Stroke Other   . Hypertension Other   . Diabetes Mother   . Diabetes Father   . Thyroid disease Maternal Aunt   . Diabetes Maternal Grandmother   . Diabetes Paternal Grandmother   . Cancer Paternal Grandmother     Social History:  reports that she has quit smoking. Her smoking use included cigarettes. She has a 35.00 pack-year smoking history. She has never used smokeless tobacco. She reports current alcohol use. She reports that she does not use drugs.   Physical Exam: BP (!) 143/77   Pulse 68   Ht 5\' 1"  (1.549 m)   Wt 200 lb (90.7 kg)   BMI 37.79 kg/m   Constitutional:  Alert and oriented, No acute distress. HEENT: [place] AT, moist mucus membranes.  Trachea midline, no masses. Cardiovascular: No clubbing, cyanosis, or edema. Respiratory: Normal respiratory  effort, no increased work of breathing.    Assessment & Plan:    1.  Recurrent stone disease  Doing well status post ureteroscopic stone removal  Possible 3 mm right renal calculus  We discussed a metabolic evaluation to include 24-hour urine study and blood work however she does not desire to pursue  It was recommended she increase her water intake to keep urine output greater than 2 L per day.  Ten 10 ounce glasses of water per day is generally enough to produce this output.  Oxalate moderation was discussed and she was provided literature on high oxalate foods and beverages.  Avoidance of salty foods and added salt was discussed as well as avoidance of excessive intake of animal  protein.  Increased intake of potassium rich citrus products was recommended.  Follow-up 6 months with KUB   [name], MD  [facility] [address] [place], [place] [address] [phone]

## 2020-01-05 ENCOUNTER — Telehealth (INDEPENDENT_AMBULATORY_CARE_PROVIDER_SITE_OTHER): Payer: Self-pay | Admitting: "Endocrinology

## 2020-01-05 NOTE — Telephone Encounter (Signed)
Who's calling (name and relationship to patient) : Arty Baumgartner   Best contact number: [phone]  Provider they see: Dr. Fransico Michael  Reason for call: Patient stopped in office this morning for blood work. Patient dropped off note for provider which was placed on desk.   Patient states she had a missed call from provider and was returning the call made to her. She will be near her phone to answer future calls.   Call ID:      PRESCRIPTION REFILL ONLY  Name of prescription:  Pharmacy:

## 2020-01-06 LAB — COMPREHENSIVE METABOLIC PANEL
AG Ratio: 2 (calc) (ref 1.0–2.5)
ALT: 54 U/L — ABNORMAL HIGH (ref 6–29)
AST: 32 U/L (ref 10–35)
Albumin: 4.6 g/dL (ref 3.6–5.1)
Alkaline phosphatase (APISO): 92 U/L (ref 37–153)
BUN: 16 mg/dL (ref 7–25)
CO2: 29 mmol/L (ref 20–32)
Calcium: 10.2 mg/dL (ref 8.6–10.4)
Chloride: 105 mmol/L (ref 98–110)
Creat: 0.78 mg/dL (ref 0.50–0.99)
Globulin: 2.3 g/dL (calc) (ref 1.9–3.7)
Glucose, Bld: 96 mg/dL (ref 65–99)
Potassium: 4.8 mmol/L (ref 3.5–5.3)
Sodium: 142 mmol/L (ref 135–146)
Total Bilirubin: 0.6 mg/dL (ref 0.2–1.2)
Total Protein: 6.9 g/dL (ref 6.1–8.1)

## 2020-01-06 LAB — LIPID PANEL
Cholesterol: 242 mg/dL — ABNORMAL HIGH (ref <200)
HDL: 78 mg/dL (ref >=50)
LDL Cholesterol (Calc): 146 mg/dL (calc) — ABNORMAL HIGH
Non-HDL Cholesterol (Calc): 164 mg/dL (calc) — ABNORMAL HIGH (ref <130)
Total CHOL/HDL Ratio: 3.1 (calc) (ref <5.0)
Triglycerides: 79 mg/dL (ref <150)

## 2020-01-06 LAB — VITAMIN D 25 HYDROXY (VIT D DEFICIENCY, FRACTURES): Vit D, 25-Hydroxy: 34 ng/mL (ref 30–100)

## 2020-01-06 LAB — HEMOGLOBIN A1C
Hgb A1c MFr Bld: 5.8 % of total Hgb — ABNORMAL HIGH (ref <5.7)
Mean Plasma Glucose: 120 (calc)
eAG (mmol/L): 6.6 (calc)

## 2020-01-06 LAB — PTH, INTACT AND CALCIUM
Calcium: 10.2 mg/dL (ref 8.6–10.4)
PTH: 22 pg/mL (ref 14–64)

## 2020-01-06 LAB — TSH: TSH: 2.28 mIU/L (ref 0.40–4.50)

## 2020-01-06 LAB — T3, FREE: T3, Free: 3.2 pg/mL (ref 2.3–4.2)

## 2020-01-06 LAB — T4, FREE: Free T4: 1.3 ng/dL (ref 0.8–1.8)

## 2020-01-08 ENCOUNTER — Other Ambulatory Visit: Payer: Self-pay

## 2020-01-16 ENCOUNTER — Encounter (INDEPENDENT_AMBULATORY_CARE_PROVIDER_SITE_OTHER): Payer: Self-pay

## 2020-01-19 ENCOUNTER — Telehealth (INDEPENDENT_AMBULATORY_CARE_PROVIDER_SITE_OTHER): Payer: Medicare PPO | Admitting: "Endocrinology

## 2020-01-19 ENCOUNTER — Telehealth (INDEPENDENT_AMBULATORY_CARE_PROVIDER_SITE_OTHER): Payer: Self-pay | Admitting: "Endocrinology

## 2020-01-19 NOTE — Telephone Encounter (Signed)
°  Who's calling (name and relationship to patient) : [name] [name] (patient)  Best contact number: [phone]  Provider they see: Dr. Fransico Michael  Reason for call: Requests call back with lab results    PRESCRIPTION REFILL ONLY  Name of prescription:  Pharmacy:

## 2020-01-19 NOTE — Telephone Encounter (Signed)
Call agian

## 2020-01-19 NOTE — Telephone Encounter (Signed)
Gave lab results

## 2020-01-19 NOTE — Telephone Encounter (Signed)
Called. Mailbox is full

## 2020-01-19 NOTE — Progress Notes (Deleted)
CC: [name] [name] presents at this televisit today for follow-up hypothyroid s/p I-131 treatment, hypertension, goiter, dyspepsia, fatigue, GERD, Hashimoto's disease, status post CVA, partial right hemiparesis, hyperlipidemia, smoking, prediabetes, and supraclavicular fullness    History of present illness: [name] [name] is a 59 year old Caucasian woman.   1. [name] [name] first presented to me on 08/10/04 in referral from her primary care provider, Dr. Frederica Kuster in [place], [place], for evaluation and management of hyperthyroidism. In retrospect, the patient had chronic problems with infected sinuses, inner ear symptoms, and spinning dizziness. She also had complaints of severe fatigue and weight gain. Dr. Jonette Eva had performed thyroid tests on 06/14/04. TSH was 0.13, free T4 1.04. Thyroid ultrasound showed small and large nodules in a thyroid gland that was somewhat enlarged overall. On examination she had a 20+ gram thyroid gland. Thyroid gland was firm, but nontender. She also had some diastolic hypertension. Thyroid tests showed a TSH of 0.437, free T4 1.01, and free T3 of 2.6. Her TPO antibody level was 29 9. Her TSI level was 98. It appeared at that time that the patient likely had some elevation of thyroid hormone due to Hashimoto's disease or had a combination of intermittently active Hashimoto's Disease and intermittently active  Graves' Disease.   2. During the next 2 years, her TSH values varied from 0.350-0.854. By February of 2008, however, her TSH decreased to 0.008, her free T4 increased to 2.08, and her free T3 increased to 5.8. She was clearly hyperthyroid and thyrotoxic. TSI level at that point was 1.8 with normal being 1.3 or less. On 05/25/06 she underwent radioactive iodine treatment with I-131. She subsequently became hypothyroid and was started on Synthroid replacement therapy. Since then her doses of Synthroid have been adjusted several times.   3. During the past 12 years the patient has had  several other medical problems:  A. She has had one or more CVAs/TIAs, which have resulted in some mild right hemiparesis and some transient memory losses. The MRI of her brain in February 2010 showed evidence for remote infarcts involving the posterior right temporal lobe and parietal lobe.    B. She also had a 3-4 month episode of spontaneous galactorrhea in 2010. Brain MRI showed a 2 x 3 mm area of delayed enhancement seen posteriorly on the left side of her pituitary gland. I felt at the time that it would be very uncharacteristic for a true prolactinoma this small to actually produce hyperprolactinemia and clinical galactorrhea. The galactorrhea subsequently resolved spontaneously.   C. On 08/02/2010 she presented with increased prominence of both supraclavicular areas. A CT scan was performed which showed no significant pathology. She was evaluated by Dr. Christia Reading of [facility]. Dr. Jenne Pane could not identify any cause of the supraclavicular area prominence other than obesity. Since then, that prominence has waxed and waned with her changes in weight.    D. The patient was started on Crestor for hyperlipidemia, but initially could not tolerate even the 5 mg dose more than twice per week. She was subsequently started on Zetia, 10 mg/day, which she tolerated better, but did not control her lipids as well. Crestor was later re-started at a lower dose and she was able to tolerate that medication. However, she later discontinued the Crestor due to symptoms.   E. She has had multiple GI complaints over time, to include severe, chronic diarrhea, heartburn, and abdominal pains. In the Summer of 2015 she saw a gastroenterologist who performed a colonoscopy and removed a  polyp. He also put her on a gluten-free and casein-free diet. Her symptoms improved on that diet, but she has not been consistent with that diet for quite some time.    F. The patient has been a smoker for many years. She successfully  stopped smoking by using Chantix several years ago, but later resumed smoking. She has been very reluctant to try to stop smoking again.   G. She had ingrown toenail surgery in late September and again in November 2020.  H. On 02/12/19 she developed acute appendicitis. She had an appendectomy at [facility] in [place] (the former [facility]). She was hospitalized for 10 days. She also developed pleurisy and shortness of breath. She is being followed by a doctor in [place], [place]. She stopped smoking.   I. Her abdominal CT scan showed that her kidney stone was larger prior to her March 2021 visit. She was being followed by a urologist in [place].   3. The patient's last televisit was on 09/15/19. I continued her Synthroid dose of 112 mcg/day, her calcium dose of 900 mg/day, her vitamin D dose of 3,000 IU/day, her Centrum Silver once a day, and rabeprazole, 20 mg, daily. However, after reviewing her lab results in April, I added one additional 112 mcg Synthroid tablet per week. She now takes the medication as one 112 mcg tablet for 5 days each week and 1.5 tablets on two days per week. She stopped the rabeprazole due to the possibility that the medication could cause hematuria. I asked her to resume the rabeprazole, 20 mg, twice daily. After reviewing her lab results, I asked her to reduce the extra  1/2 of a Synthroid pill from two days per week to one day per week.  A. In the interim, she has been "doing all right".   B. She still has tenderness in her RLQ and hypogastrium. .   C. She refuses to get her covid vaccine. She does not trust Dr. Karen Kays or Tamela Gammon. She is socially distancing and masking.   D. She still has episodic stabbing pains in her feet. She also gets cramps in her legs. Both of these pains tend to occur about 3 AM. These pains sometimes limit her willingness and ability to walk. Her knees have not been too bad. She has had more ankle edema at times. Her dogs are keeping her more  active walking.    E. She takes Tums for her heartburn, that is worse since stopping rabeprazole.   E. She continues to have intermittent diarrhea, some days better and some days worse, not for the past two weeks. She is pretty good at following her gluten-free diet.     F. She also has fairly consistent joint and soft tissue aches and pains. She has been having her usual low back pains.  G. She has not had any further stroke symptoms. Her neurologist continued her on Plavix, 75 mg/day.    H. She take Tums, Centrum Silver, calcium 900 mg, and 3,000 IU of vitamin D once daily.   I. She still has not had her sleep apnea evaluation because she does not want to wear a C-pap device like her mother wore many years ago.   J. Her allergies have not been acting up recently, but will be soon.  She has two dogs.       K. She is doing well emotionally.   L. She is also doing well cognitively. She again responded with her classic answer, "'I'm fine.  I'm not eating checkers yet."   M. She had several tick bites earlier this summer. She was tested for RMSF.   4. Pertinent Review of Systems: Constitutional: She says, "I'm feeling pretty good today, except for being "a little sore". Her stamina is better, but she still gets short of breath. She still snores "like a freight train" and still frequently wakes up choking. Eyes: Vision is good with her glasses. She had an eye exam in October 2020.  Neck:  She has no complaints of anterior neck swelling, soreness, tenderness, pressure, or discomfort. She has not had any recent  trapezius spasm and pinching of the cervical nerves.   Heart: She has not had any problems with fast heart rate, chest pain, or chest pressure. 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 Her cardiologist confirmed that diagnosis at her follow up appointment in September or October 2016. The patient has no other complaints of irregular heat beats,  chest pain, or chest pressure.  Gastrointestinal: Her heartburn and GERD are worse. She continues to have intermittent problems with swallowing. She was told in about 2010 or 2011 that her UGI showed a spastic esophagus.She still has frequent, intermittent diarrhea. Her GERD and heartburn are better when she avoids spicy foods, like ketchup. Unfortunately she likes spicy foods and "I love ketchup."    Legs: As above. She still occasionally has the sensation of something crawling on her skin in the upper right thigh and discomfort of her right patella. Muscle mass and strength seem unchanged. Her right side remains somewhat weaker than her left. She still notes some burning of her calves at times. She occasionally has pedal edema, but less now.  Feet: As above. She sometimes has numbness, burning, and "tingling" in her feet, more often on the right foot. She notes some edema at times. Her right leg has become stronger over time, so she only rarely drags her right foot anymore.  Neuro: She has persistent bilateral numbness in her thumbs and first two fingers bilaterally.  Mental/psychological: Her abilities to think, to pay attention, to remember, and to make decisions are good. "I ain't eating checkers." Skin: She has several new moles. Her dermatologist told her that they were nothing to worry about.   REVIEW OF SYSTEMS: There are no other significant problems involving her other body systems.  PAST MEDICAL, FAMILY, AND SOCIAL HISTORY:  1. Family: She still lives alone. She has frequent social interactions with friends and family. She has two sisters with hypothyroidism. Her daughter is also having thyroid problems. 2. Activities: She has been more physically active recently, in larger part due to needing to walk her dogs.     3. Tobacco: She stopped smoking.   4. Primary care provider: Dr. Vivia EwingWendy Barker, [place] Circlenee Rodriguez, [place].  5. Neurologist: Dr. Lacey JensenVictor Owusu, [facility],  phone [phone] 6. Cardiologist: Dr. Marny LowensteinGhandi in [place]. She has not yet found a new cardiologist in [place].   PHYSICAL EXAM:  There were no vitals taken for this visit.  Growth percentile SmartLinks can only be used for patients less than 60 years old.  Wt Readings from Last 3 Encounters:  12/10/19 200 lb (90.7 kg)  10/29/19 200 lb (90.7 kg)  10/24/19 200 lb (90.7 kg)    Ht Readings from Last 3 Encounters:  12/10/19 5\' 1"  (1.549 m)  10/29/19 5\' 1"  (1.549 m)  10/24/19 5\' 1"  (1.549 m)  HC Readings from Last 3 Encounters:  No data found for [facility]   Facility age limit for growth percentiles is 20 years.  There is no height or weight on file to calculate BMI. No height and weight on file for this encounter.  There is no height or weight on file to calculate BSA.   Constitutional: The patient looks healthy and appears physically and emotionally well. She is alert, bright, affable, perky, very sharp, and has a great sense of humor.  Eyes: There is no arcus or proptosis.  Mouth: The oropharynx appears normal. The tongue appears normal. There is normal oral moisture. There is no obvious gingivitis. Neck: There are no bruits present. The thyroid gland appears normal in size. The thyroid gland is normal at approximately 18 grams in size. The consistency of the thyroid gland is normal. There is no thyroid tenderness to palpation. Lungs: The lungs are clear. Air movement is good. Heart: The heart rhythm and rate appear normal. Heart sounds S1 and S2 are normal. I do not appreciate any pathologic heart murmurs. Abdomen: The abdominal size is enlarged. Bowel sounds are normal. The abdomen is soft and non-tender. There is no obviously palpable hepatomegaly, splenomegaly, or other masses.  Arms: Muscle mass appears appropriate for age.  Hands: There is no obvious tremor. Phalangeal and metacarpophalangeal joints appear normal. Palms are normal. Legs: Muscle mass appears appropriate for age.  There is no edema.  Neurologic: Muscle strength is normal for age and gender  in both the upper and the lower extremities. Muscle tone appears normal. Sensation to touch is normal in the legs.   LABS:   Labs 01/05/20: HbA1c 5.8%; TSH 2.28, free T4 1.3, free T3 3.2; CMP normal, except ALT 54 (ref 6-29); PTH 22, calcium 10.2, 25-OH vitamin D 34; cholesterol 242, triglycerides 79. HDL 78, LDL 146  Labs 09/15/19: TSH 0.49, free T4 1.3, free T3 3.3  Labs 08/29/19; Urinalysis: 0-5 WBC, 3-10 RBC  Labs 07/30/19: Urinalysis: 0-5 WBC, 0-2 RBC  Labs 06/25/19: HbA1c 5.8%; TSH 3.50, free T4 1.2, free T3 2.7; cholesterol 255, triglycerides 123, HDL 86, LDL 144; PTH 35 (ref 14-64), calcium 9.9 (ref 8.6-10.4); 25-OH vitamin D 27 (ref 30-100)  Labs 05/13/18; PTH 35, calcium 9.7, 25-OH vitamin D 31  Labs 05/07/18: HbA1c 5.6%; TSH 1.02, free T4 1.58, free T3 2.7; CMP normal with calcium 9.5; CBC normal; cholesterol 239, triglycerides 110, HDL 75, LDL 142;   Labs 12/13/17: QHU7M 5.5%, CBG 91  Labs 12/10/17: TSH 1.23, free T4 1.39, free T3 2.5; CMP normal; cholesterol 254, triglycerides 96, HDL 71, LDL 163  Labs 08/08/17: HbA1c 5.4%, CBG 94  Labs 08/02/17: TSH 1.17, free T4 1.28, free T3 2.3; uric acid 6.4 (ref 2.5-7.0)  Labs 05/08/17: HbA1c 5.6%, CBG 88  Labs 04/19/17: TSH 0.453, free T4 1.61, free T3 2.7; Calcium 9.8, PTH 47; C-peptide 3.3  Labs 11/01/16: HbA1c 5.7%, CBG 84; TSH 1.23, free T4 1.5, free T3 2.7; PTH 54 (ref 14-64), calcium 9.1, 25-OH vitamin D 31  Labs 08/01/16: HbA1c 5.5%; TSH 7.10, free T4 1.0. Free T3 2.2; cholesterol 283, triglycerides 101, HDL 83, LDL 180  Labs 07/06/16: CBC normal, except RBC 3.96 (ref 4.04-5.48) and MCH 32.3 (ref 27-31.2), CMP normal  Labs 04/25/16: HbA1c 5.3%, CBG 105  Labs 01/21/16: HbA1c 5.8%; calcium 9.4, PTH 52, 25-OH vitamin D 35; TSH 0.01, free T4 1.8, free T3 3.8; cholesterol 239, triglycerides 65, HDL 75, LDL 151; CMP normal  Labs 5/46/50: TSH 5.01, free  T4  1.45, TPO antibody <1, anti-thyroglobulin antibody <1; CMP normal, with calcium 10.0; CBC normal  Labs 04/27/15: HbA1c 5.5%; TSH 3.61, free T4 1.4, free T3 2.2; cholesterol 258, triglycerides 96, HDL 77, LDL 162; PTH 32, calcium 10.0, 25-OH vitamin d 33  Labs 01/12/15: TSH 0.885, free T4 1.88, free T3 3.01; CBC: Normal except MCV 98; CMP normal; vitamin B12 (344 (normal 239-931), folic acid 19.7 (normal >5.4); PTH 80.49 (normal 12-72), calcium 9.10 (normal 8.5-10.6), 25-OH vitamin D 32.6; cholesterol 231, triglycerides 83, HDL 66, LDL 148  Labs 09/24/14: Ordered, but not performed  Labs 05/21/14: TSH 0.490, free T4 1.46, free T3 2.4; HbA1C 5.8%; calcium 9.3, PTH 52; 25-OH vitamin D 40; cholesterol 246, triglycerides 92, HDL 70, LDL 176; calcitonin <2  Labs 02/09/14: TSH 2.96, free T4 1.43, free T3 2.2  Labs 02/05/14: Urinalysis: large blood, 15 ketones; alkaline phosphatase 108 (normal 32-92);   Labs 02/02/14: Urinalysis: trace blood, no ketones; Hgb 13.6, Hct 43.9, MCV 100.3; CMP normal, with alkaline phosphatase 16 (normal 40-129); TSH 3.03; B12 305 (211-946); cholesterol 282, triglycerides 68, HDL 79, LDL 189; 25-hydroxy vitamin D >60  Labs 07/11/13: B12 236 (normal 211-911), calcitonin < 2 (normal < 6)  Labs 03/10/13: CMP normal. TSH 1.456, free T4 1.36, free T3 2.6; cholesterol 194, triglycerides 82, HDL 72, LDL 106  Labs 11/18/13: TSH 0.539, free T4 1.30, free T3 2.6  Labs 10/16/12: CMP normal; TSH 2.20, free T4 1.10, free T3 2.2; cholesterol 202, triglycerides 77, HDL 82, LDL 105  Labs 06/18/12: TSH 1.260, free T4 1.58, free T3 2.5, cholesterol 211, triglycerides 71, HDL 78, LDL 119  Labs 02/13/12: TSH 0.235, free T4 1.83, cholesterol 236, triglycerides 85, HDL 68, LDL 151. Had been back on Crestor for about 3 weeks, but was off it for about 7 days prior to the labs being drawn.   Labs 01/09/12: AST 45, ALT 70, cholesterol 298, triglycerides 85, HDL 72, LDL 209, TSH 0.44, free T4 1.52,  25-hydroxy,  vitamin D 41. Had been off Crestor for months.   Labs 09/06/11: TSH 0.663, free T4 1.65, T3 117, cholesterol 225, triglycerides 65, HDL 84, LDL 128  Labs 05/09/11: TSH 3.300. Free T4: 1.66. CMP is normal. Cholesterol 214, triglycerides 67, HDL was 73, LDL 128. 25-OH-vitamin D 41.7. She started Zetia as of  05/15/11.  ASSESSMENT: 1. Hypothyroid, s/p I-131:   A. Her TFTs in August 2018 were normal on her Synthroid dose of 137 mcg/day for 6 days each week.    B. In February 2019 she was mildly hyperthyroid on that same dose. After changing the dose to 112 mcg/day, her TFTS on 08/02/17 and on 12/10/17 were mid-normal. Her TFTs were again mid-normal on 05/07/18. In April 2021, however, she needed more thyroid hormone.   C. She needs her follow up TFTs done.  2. Goiter: Thyroid gland was again within normal limits for size today.   3. Thyroiditis: Hashimoto's disease is clinically quiescent again today. The non-physiologic shifts in her TFTs at times are c/w intermittent Hashimoto's Dz activity. 4. Prediabetes/Elevated HbA1c: Her HbA1c was up into the prediabetes zone in August 2018, but then decreased back into the normal range. Her HbA1c was at the upper end of the normal range on 05/07/18, but was back into the prediabetes range in April 2021.  5. Obesity: Her weight has increased more. She needs to be careful with her diet.   6. Hypertension: She needs more daily exercise.  7. Hypercholesterolemia: The patient's total  cholesterol and LDL in March 2020 were better than in October 2019, but still too high. We discussed the possible use of injectable PCSK9 inhibitor medications at her last visit, but she was not in favor of that option. She needs to lose weight.   8. Dyspepsia/GERD: She was diagnosed with a gastric ulcer in October 2018. She did not have any ulcer symptoms. She was taking omeprazole. She feels that her reflux symptoms have improved since starting rabeprazole. Unfortunately, she  stopped the rabeprazole due to concerns that the medication might be causing hematuria. Her acid symptoms are worse now.  10. Tobacco: She stopped smoking during her 10-day hospitalization and has remained "smoke free" since then.   10. Edema: She had no edema at her last visit, but has had edema at times since then.  11. 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 When she was on the diet her diarrhea stopped. When she went off the diet her diarrhea worsened. Her calcitonin was negative, ruling out hypercalcitoninemia as a cause of her diarrhea. She continues to be followed by GI. No obvious lesions were seen during her colonoscopy in October 2019. She had to cancel her planned colonoscopy in December 2020 due to her appendicitis. She has not had any diarrhea recently.  12. Excess vitamin D: Her vitamin D levels in 2016, in 2017, and again in 2018 were normal. Her vitamin D level in March 2020 was low-normal. Her vitamin D level in April 2021 was mildly low.  13. S/p stroke: Her MRI on 04/16/08 showed CNS defects in the right cerebrum. Her neurologic defects, however, were on the right side of her body. She may have had more CNS damage in her left cerebrum than had been recognized.  She seems to have recovered fairly well, but still has some right hemiparetic findings. She is doing very well emotionally and mentally.  14. Hyperparathyroidism, secondary to inadequate calcium intake: Her PTH and calcium values in August 2018 were normal, but the calcium was at about the 25% of the normal range. Her calcium and PTH values in February 2019 were good. Her calcium and PTH in March 2020 and in April 2021 were normal.  15. Snoring: I suspect that she has OSA. She refuses to have a sleep study done.  16. Nasal congestion and associated symptoms: Her allergies have been acting up more this Summer, but Zyrtec helps.   17. Arthritis: I suspect that she has osteoarthritis. Her uric acid level was normal in  May 2019.  18. Plantar fasciitis: Some of her foot pain in the past has been due to planter fasciitis. I suggested that she purchase some good insoles with arch support. I also suggested that she see a podiatrist.    19. Supraclavicular fullness: The prominence in the supraclavicular fossae has improved over time and was not evident at her last clinic visit. It appears that the prominence was just fatty tissue.  PLAN: 1. Diagnostic: I ordered TFTs.    2. Therapeutic: Continue rabeprazole, 20 mg/day. Continue Synthroid dose of 112 mcg/day, with an additional 1/2 pill on two days per week. Continue calcium dose of 900 mg/day. Continue the vitamin D dose of 3,000 IU/day. Continue the Centrum Silver.  Stop smoking. Take Tums as above. 3. Patient education: We discussed her GI problems, her thyroiditis and hypothyroidism, her cholesterols, her neuro issues, her edema, and her foot problems. 4. Follow-up: Follow up visit in 4 months.  Level of Service: This visit lasted in excess  of 55 minutes. More than 50% of the visit was devoted to counseling.  Molli Knock, MD, CDE Adult and Pediatric Endocrinology

## 2020-01-26 ENCOUNTER — Other Ambulatory Visit (INDEPENDENT_AMBULATORY_CARE_PROVIDER_SITE_OTHER): Payer: Self-pay | Admitting: "Endocrinology

## 2020-03-23 ENCOUNTER — Ambulatory Visit (INDEPENDENT_AMBULATORY_CARE_PROVIDER_SITE_OTHER): Payer: Medicare PPO | Admitting: "Endocrinology

## 2020-03-24 ENCOUNTER — Ambulatory Visit (INDEPENDENT_AMBULATORY_CARE_PROVIDER_SITE_OTHER): Payer: Medicare PPO | Admitting: "Endocrinology

## 2020-04-15 NOTE — Progress Notes (Deleted)
CC: [name] [name] presents at this televisit today for follow-up hypothyroid s/p I-131 treatment, hypertension, goiter, dyspepsia, fatigue, GERD, Hashimoto's disease, status post CVA, partial right hemiparesis, hyperlipidemia, smoking, prediabetes, and supraclavicular fullness    History of present illness: [name] [name] is a 60 year old Caucasian woman.   1. [name] [name] first presented to me on 08/10/04 in referral from her primary care provider, Dr. Frederica Kuster in [place], [place], for evaluation and management of hyperthyroidism. In retrospect, the patient had chronic problems with infected sinuses, inner ear symptoms, and spinning dizziness. She also had complaints of severe fatigue and weight gain. Dr. Jonette Eva had performed thyroid tests on 06/14/04. TSH was 0.13, free T4 1.04. Thyroid ultrasound showed small and large nodules in a thyroid gland that was somewhat enlarged overall. On examination she had a 20+ gram thyroid gland. Thyroid gland was firm, but nontender. She also had some diastolic hypertension. Thyroid tests showed a TSH of 0.437, free T4 1.01, and free T3 of 2.6. Her TPO antibody level was 29 9. Her TSI level was 98. It appeared at that time that the patient likely had some elevation of thyroid hormone due to Hashimoto's disease or had a combination of intermittently active Hashimoto's Disease and intermittently active  Graves' Disease.   2. During the next 2 years, her TSH values varied from 0.350-0.854. By February of 2008, however, her TSH decreased to 0.008, her free T4 increased to 2.08, and her free T3 increased to 5.8. She was clearly hyperthyroid and thyrotoxic. TSI level at that point was 1.8 with normal being 1.3 or less. On 05/25/06 she underwent radioactive iodine treatment with I-131. She subsequently became hypothyroid and was started on Synthroid replacement therapy. Since then her doses of Synthroid have been adjusted several times.   3. During the past 12 years the patient has had  several other medical problems:  A. She has had one or more CVAs/TIAs, which have resulted in some mild right hemiparesis and some transient memory losses. The MRI of her brain in February 2010 showed evidence for remote infarcts involving the posterior right temporal lobe and parietal lobe.    B. She also had a 3-4 month episode of spontaneous galactorrhea in 2010. Brain MRI showed a 2 x 3 mm area of delayed enhancement seen posteriorly on the left side of her pituitary gland. I felt at the time that it would be very uncharacteristic for a true prolactinoma this small to actually produce hyperprolactinemia and clinical galactorrhea. The galactorrhea subsequently resolved spontaneously.   C. On 08/02/2010 she presented with increased prominence of both supraclavicular areas. A CT scan was performed which showed no significant pathology. She was evaluated by Dr. Christia Reading of [facility]. Dr. Jenne Pane could not identify any cause of the supraclavicular area prominence other than obesity. Since then, that prominence has waxed and waned with her changes in weight.    D. The patient was started on Crestor for hyperlipidemia, but initially could not tolerate even the 5 mg dose more than twice per week. She was subsequently started on Zetia, 10 mg/day, which she tolerated better, but did not control her lipids as well. Crestor was later re-started at a lower dose and she was able to tolerate that medication. However, she later discontinued the Crestor due to symptoms.   E. She has had multiple GI complaints over time, to include severe, chronic diarrhea, heartburn, and abdominal pains. In the Summer of 2015 she saw a gastroenterologist who performed a colonoscopy and removed a  polyp. He also put her on a gluten-free and casein-free diet. Her symptoms improved on that diet, but she has not been consistent with that diet for quite some time.    F. The patient has been a smoker for many years. She successfully  stopped smoking by using Chantix several years ago, but later resumed smoking. She has been very reluctant to try to stop smoking again.   G. She had ingrown toenail surgery in late September and again in November 2020.  H. On 02/12/19 she developed acute appendicitis. She had an appendectomy at [facility] in [place] (the former [facility] She was hospitalized for 10 days. She also developed pleurisy and shortness of breath. She is being followed by a doctor in [place], [place]. She stopped smoking.   I. Her abdominal CT scan showed that her kidney stone was larger prior to her March 2021 visit. She was being followed by a urologist in [place].   3. The patient's last televisit was on 09/15/19. I continued her Synthroid dose of 112 mcg/day, with one additional half pill twice weekly. I continued her calcium dose of 900 mg/day, her vitamin D dose of 3,000 IU/day, her Centrum Silver once a day, and rabeprazole, 20 mg, daily. She stopped the rabeprazole due to the possibility that the medication could cause hematuria.   A. In the interim, she had been healthy until she was admitted to [facility] August 2021 for left nephrolithiasis. She underwent cystoscopy, ureteroscopy, laser lithotripsy, and ureteral stent placement. The stone analysis showed monohydrate, dihydrate, and hydroxypatitel  B. She still has tenderness in her RLQ and hypogastrium. .   C. She refuses to get her covid vaccine. She does not trust Dr. Karen Kays or Tamela Gammon. She is socially distancing and masking.   D. She still has episodic stabbing pains in her feet. She also gets cramps in her legs. Both of these pains tend to occur about 3 AM. These pains sometimes limit her willingness and ability to walk. Her knees have not been too bad. She has had more ankle edema at times. Her dogs are keeping her more active walking.    E. She takes Tums for her heartburn, that is worse since stopping rabeprazole.   E. She continues to have intermittent  diarrhea, some days better and some days worse, not for the past two weeks. She is pretty good at following her gluten-free diet.     F. She also has fairly consistent joint and soft tissue aches and pains. She has been having her usual low back pains.  G. She has not had any further stroke symptoms. Her neurologist continued her on Plavix, 75 mg/day.    H. She take Tums, Centrum Silver, calcium 900 mg, and 3,000 IU of vitamin D once daily.   I. She still has not had her sleep apnea evaluation because she does not want to wear a C-pap device like her mother wore many years ago.   J. Her allergies have not been acting up recently, but will be soon.  She has two dogs.       K. She is doing well emotionally.   L. She is also doing well cognitively. She again responded with her classic answer, "'I'm fine. I'm not eating checkers yet."   M. She had several tick bites earlier this summer. She was tested for RMSF.   4. Pertinent Review of Systems: Constitutional: She says, "I'm feeling pretty good today, except for being "a little sore". Her stamina  is better, but she still gets short of breath. She still snores "like a freight train" and still frequently wakes up choking. Eyes: Vision is good with her glasses. She had an eye exam in October 2020.  Neck:  She has no complaints of anterior neck swelling, soreness, tenderness, pressure, or discomfort. She has not had any recent  trapezius spasm and pinching of the cervical nerves.   Heart: She has not had any problems with fast heart rate, chest pain, or chest pressure. 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 Her cardiologist confirmed that diagnosis at her follow up appointment in September or October 2016. The patient has no other complaints of irregular heat beats, chest pain, or chest pressure.  Gastrointestinal: Her heartburn and GERD are worse. She continues to have intermittent problems with  swallowing. She was told in about 2010 or 2011 that her UGI showed a spastic esophagus.She still has frequent, intermittent diarrhea. Her GERD and heartburn are better when she avoids spicy foods, like ketchup. Unfortunately she likes spicy foods and "I love ketchup."    Legs: As above. She still occasionally has the sensation of something crawling on her skin in the upper right thigh and discomfort of her right patella. Muscle mass and strength seem unchanged. Her right side remains somewhat weaker than her left. She still notes some burning of her calves at times. She occasionally has pedal edema, but less now.  Feet: As above. She sometimes has numbness, burning, and "tingling" in her feet, more often on the right foot. She notes some edema at times. Her right leg has become stronger over time, so she only rarely drags her right foot anymore.  Neuro: She has persistent bilateral numbness in her thumbs and first two fingers bilaterally.  Mental/psychological: Her abilities to think, to pay attention, to remember, and to make decisions are good. "I ain't eating checkers." Skin: She has several new moles. Her dermatologist told her that they were nothing to worry about.   REVIEW OF SYSTEMS: There are no other significant problems involving her other body systems.  PAST MEDICAL, FAMILY, AND SOCIAL HISTORY:  1. Family: She still lives alone. She has frequent social interactions with friends and family. She has two sisters with hypothyroidism. Her daughter is also having thyroid problems. 2. Activities: She has been more physically active recently, in larger part due to needing to walk her dogs.     3. Tobacco: She stopped smoking.   4. Primary care provider: Dr. Vivia Ewing, [address], [place] in [place].  5. Neurologist: Dr. Lacey Jensen, [facility], phone [phone] 6. Cardiologist: Dr. Marny Lowenstein in [place] She has not yet found a new cardiologist in [place].   PHYSICAL  EXAM:  There were no vitals taken for this visit.  Growth percentile SmartLinks can only be used for patients less than 28 years old.  Wt Readings from Last 3 Encounters:  12/10/19 200 lb (90.7 kg)  10/29/19 200 lb (90.7 kg)  10/24/19 200 lb (90.7 kg)    Ht Readings from Last 3 Encounters:  12/10/19 5\' 1"  (1.549 m)  10/29/19 5\' 1"  (1.549 m)  10/24/19 5\' 1"  (1.549 m)    HC Readings from Last 3 Encounters:  No data found for [facility]   Facility age limit for growth percentiles is 20 years.  There is no height or weight on file to calculate BMI. No height and weight on file for this encounter.  There is no height or weight on file to calculate BSA.   Constitutional: The patient looks healthy and appears physically and emotionally well. She is alert, bright, affable, perky, very sharp, and has a great sense of humor.  Eyes: There is no arcus or proptosis.  Mouth: The oropharynx appears normal. The tongue appears normal. There is normal oral moisture. There is no obvious gingivitis. Neck: There are no bruits present. The thyroid gland appears normal in size. The thyroid gland is normal at approximately 18 grams in size. The consistency of the thyroid gland is normal. There is no thyroid tenderness to palpation. Lungs: The lungs are clear. Air movement is good. Heart: The heart rhythm and rate appear normal. Heart sounds S1 and S2 are normal. I do not appreciate any pathologic heart murmurs. Abdomen: The abdominal size is enlarged. Bowel sounds are normal. The abdomen is soft and non-tender. There is no obviously palpable hepatomegaly, splenomegaly, or other masses.  Arms: Muscle mass appears appropriate for age.  Hands: There is no obvious tremor. Phalangeal and metacarpophalangeal joints appear normal. Palms are normal. Legs: Muscle mass appears appropriate for age. There is no edema.  Neurologic: Muscle strength is normal for age and gender  in both the upper and the lower extremities.  Muscle tone appears normal. Sensation to touch is normal in the legs.   LABS:   Labs 08/29/19; Urinalysis: 0-5 WBC, 3-10 RBC  Labs 07/30/19: Urinalysis: 0-5 WBC, 0-2 RBC  Labs 06/25/19: HbA1c 5.8%; TSH 3.50, free T4 1.2, free T3 2.7; cholesterol 255, triglycerides 123, HDL 86, LDL 144; PTH 35 (ref 14-64), calcium 9.9 (ref 8.6-10.4); 25-OH vitamin D 27 (ref 30-100)  Labs 05/13/18; PTH 35, calcium 9.7, 25-OH vitamin D 31  Labs 05/07/18: HbA1c 5.6%; TSH 1.02, free T4 1.58, free T3 2.7; CMP normal with calcium 9.5; CBC normal; cholesterol 239, triglycerides 110, HDL 75, LDL 142;   Labs 12/13/17: ZOX0R 5.5%, CBG 91  Labs 12/10/17: TSH 1.23, free T4 1.39, free T3 2.5; CMP normal; cholesterol 254, triglycerides 96, HDL 71, LDL 163  Labs 08/08/17: HbA1c 5.4%, CBG 94  Labs 08/02/17: TSH 1.17, free T4 1.28, free T3 2.3; uric acid 6.4 (ref 2.5-7.0)  Labs 05/08/17: HbA1c 5.6%, CBG 88  Labs 04/19/17: TSH 0.453, free T4 1.61, free T3 2.7; Calcium 9.8, PTH 47; C-peptide 3.3  Labs 11/01/16: HbA1c 5.7%, CBG 84; TSH 1.23, free T4 1.5, free T3 2.7; PTH 54 (ref 14-64), calcium 9.1, 25-OH vitamin D 31  Labs 08/01/16: HbA1c 5.5%; TSH 7.10, free T4 1.0. Free T3 2.2; cholesterol 283, triglycerides 101, HDL 83, LDL 180  Labs 07/06/16: CBC normal, except RBC 3.96 (ref 4.04-5.48) and MCH 32.3 (ref 27-31.2), CMP normal  Labs 04/25/16: HbA1c 5.3%, CBG 105  Labs 01/21/16: HbA1c 5.8%; calcium 9.4, PTH 52, 25-OH vitamin D 35; TSH 0.01, free T4 1.8, free T3 3.8; cholesterol 239, triglycerides 65, HDL 75, LDL 151; CMP normal  Labs 08/07/52: TSH 5.01, free T4 1.45, TPO antibody <1, anti-thyroglobulin antibody <1; CMP normal, with calcium 10.0; CBC normal  Labs 04/27/15: HbA1c 5.5%; TSH 3.61, free T4 1.4, free T3 2.2; cholesterol 258, triglycerides 96, HDL 77, LDL 162; PTH 32, calcium 10.0, 25-OH vitamin d 33  Labs 01/12/15: TSH 0.885, free T4 1.88, free T3 3.01; CBC: Normal except MCV 98; CMP normal; vitamin B12 (344 (normal  239-931), folic acid 19.7 (normal >5.4); PTH 80.49 (normal 12-72), calcium 9.10 (normal 8.5-10.6), 25-OH vitamin D 32.6; cholesterol 231, triglycerides 83, HDL 66, LDL  148  Labs 09/24/14: Ordered, but not performed  Labs 05/21/14: TSH 0.490, free T4 1.46, free T3 2.4; HbA1C 5.8%; calcium 9.3, PTH 52; 25-OH vitamin D 40; cholesterol 246, triglycerides 92, HDL 70, LDL 176; calcitonin <2  Labs 02/09/14: TSH 2.96, free T4 1.43, free T3 2.2  Labs 02/05/14: Urinalysis: large blood, 15 ketones; alkaline phosphatase 108 (normal 32-92);   Labs 02/02/14: Urinalysis: trace blood, no ketones; Hgb 13.6, Hct 43.9, MCV 100.3; CMP normal, with alkaline phosphatase 16 (normal 40-129); TSH 3.03; B12 305 (211-946); cholesterol 282, triglycerides 68, HDL 79, LDL 189; 25-hydroxy vitamin D >60  Labs 07/11/13: B12 236 (normal 211-911), calcitonin < 2 (normal < 6)  Labs 03/10/13: CMP normal. TSH 1.456, free T4 1.36, free T3 2.6; cholesterol 194, triglycerides 82, HDL 72, LDL 106  Labs 11/18/13: TSH 0.539, free T4 1.30, free T3 2.6  Labs 10/16/12: CMP normal; TSH 2.20, free T4 1.10, free T3 2.2; cholesterol 202, triglycerides 77, HDL 82, LDL 105  Labs 06/18/12: TSH 1.260, free T4 1.58, free T3 2.5, cholesterol 211, triglycerides 71, HDL 78, LDL 119  Labs 02/13/12: TSH 0.235, free T4 1.83, cholesterol 236, triglycerides 85, HDL 68, LDL 151. Had been back on Crestor for about 3 weeks, but was off it for about 7 days prior to the labs being drawn.   Labs 01/09/12: AST 45, ALT 70, cholesterol 298, triglycerides 85, HDL 72, LDL 209, TSH 0.44, free T4 1.52, 25-hydroxy,  vitamin D 41. Had been off Crestor for months.   Labs 09/06/11: TSH 0.663, free T4 1.65, T3 117, cholesterol 225, triglycerides 65, HDL 84, LDL 128  Labs 05/09/11: TSH 1.660. Free T4: 1.66. CMP is normal. Cholesterol 214, triglycerides 67, HDL was 73, LDL 128. 25-OH-vitamin D 41.7. She started Zetia as of  05/15/11.  ASSESSMENT: 1. Hypothyroid, s/p I-131:   A.  Her TFTs in August 2018 were normal on her Synthroid dose of 137 mcg/day for 6 days each week.    B. In February 2019 she was mildly hyperthyroid on that same dose. After changing the dose to 112 mcg/day, her TFTS on 08/02/17 and on 12/10/17 were mid-normal. Her TFTs were again mid-normal on 05/07/18. In April 2021, however, she needed more thyroid hormone.   C. She needs her follow up TFTs done.  2. Goiter: Thyroid gland was again within normal limits for size today.   3. Thyroiditis: Hashimoto's disease is clinically quiescent again today. The non-physiologic shifts in her TFTs at times are c/w intermittent Hashimoto's Dz activity. 4. Prediabetes/Elevated HbA1c: Her HbA1c was up into the prediabetes zone in August 2018, but then decreased back into the normal range. Her HbA1c was at the upper end of the normal range on 05/07/18, but was back into the prediabetes range in April 2021.  5. Obesity: Her weight has increased more. She needs to be careful with her diet.   6. Hypertension: She needs more daily exercise.  7. Hypercholesterolemia: The patient's total cholesterol and LDL in March 2020 were better than in October 2019, but still too high. We discussed the possible use of injectable PCSK9 inhibitor medications at her last visit, but she was not in favor of that option. She needs to lose weight.   8. Dyspepsia/GERD: She was diagnosed with a gastric ulcer in October 2018. She did not have any ulcer symptoms. She was taking omeprazole. She feels that her reflux symptoms have improved since starting rabeprazole. Unfortunately, she stopped the rabeprazole due to concerns that the medication might  be causing hematuria. Her acid symptoms are worse now.  10. Tobacco: She stopped smoking during her 10-day hospitalization and has remained "smoke free" since then.   10. Edema: She had no edema at her last visit, but has had edema at times since then.  11. 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  When she was on the diet her diarrhea stopped. When she went off the diet her diarrhea worsened. Her calcitonin was negative, ruling out hypercalcitoninemia as a cause of her diarrhea. She continues to be followed by GI. No obvious lesions were seen during her colonoscopy in October 2019. She had to cancel her planned colonoscopy in December 2020 due to her appendicitis. She has not had any diarrhea recently.  12. Excess vitamin D: Her vitamin D levels in 2016, in 2017, and again in 2018 were normal. Her vitamin D level in March 2020 was low-normal. Her vitamin D level in April 2021 was mildly low.  13. S/p stroke: Her MRI on 04/16/08 showed CNS defects in the right cerebrum. Her neurologic defects, however, were on the right side of her body. She may have had more CNS damage in her left cerebrum than had been recognized.  She seems to have recovered fairly well, but still has some right hemiparetic findings. She is doing very well emotionally and mentally.  14. Hyperparathyroidism, secondary to inadequate calcium intake: Her PTH and calcium values in August 2018 were normal, but the calcium was at about the 25% of the normal range. Her calcium and PTH values in February 2019 were good. Her calcium and PTH in March 2020 and in April 2021 were normal.  15. Snoring: I suspect that she has OSA. She refuses to have a sleep study done.  16. Nasal congestion and associated symptoms: Her allergies have been acting up more this Summer, but Zyrtec helps.   17. Arthritis: I suspect that she has osteoarthritis. Her uric acid level was normal in May 2019.  18. Plantar fasciitis: Some of her foot pain in the past has been due to planter fasciitis. I suggested that she purchase some good insoles with arch support. I also suggested that she see a podiatrist.    19. Supraclavicular fullness: The prominence in the supraclavicular fossae has improved over time and was not evident at her last clinic visit. It appears that the  prominence was just fatty tissue.  PLAN: 1. Diagnostic: I ordered TFTs.    2. Therapeutic: Continue rabeprazole, 20 mg/day. Continue Synthroid dose of 112 mcg/day, with an additional 1/2 pill on two days per week. Continue calcium dose of 900 mg/day. Continue the vitamin D dose of 3,000 IU/day. Continue the Centrum Silver.  Stop smoking. Take Tums as above. 3. Patient education: We discussed her GI problems, her thyroiditis and hypothyroidism, her cholesterols, her neuro issues, her edema, and her foot problems. 4. Follow-up: Follow up visit in 4 months.  Level of Service: This visit lasted in excess of 55 minutes. More than 50% of the visit was devoted to counseling.  Molli KnockMichael Brennan, MD, CDE Adult and Pediatric Endocrinology

## 2020-04-16 ENCOUNTER — Ambulatory Visit (INDEPENDENT_AMBULATORY_CARE_PROVIDER_SITE_OTHER): Payer: Medicare PPO | Admitting: "Endocrinology

## 2020-04-22 ENCOUNTER — Other Ambulatory Visit (INDEPENDENT_AMBULATORY_CARE_PROVIDER_SITE_OTHER): Payer: Self-pay | Admitting: "Endocrinology

## 2020-04-22 ENCOUNTER — Ambulatory Visit (INDEPENDENT_AMBULATORY_CARE_PROVIDER_SITE_OTHER): Payer: Medicare PPO | Admitting: "Endocrinology

## 2020-06-02 ENCOUNTER — Telehealth (INDEPENDENT_AMBULATORY_CARE_PROVIDER_SITE_OTHER): Payer: Self-pay | Admitting: "Endocrinology

## 2020-06-02 DIAGNOSIS — E063 Autoimmune thyroiditis: Secondary | ICD-10-CM

## 2020-06-02 NOTE — Telephone Encounter (Signed)
Called for Fax #, left HIPAA approved voicemail for return phone call.    Found Fax # online , spoke with Dr. Fransico Michael and ordered the requested lab work.   Faxed Lab Request  Called patient to update, requested she call to verify, that if they have not received it to get a good fax number and call me back.

## 2020-06-02 NOTE — Telephone Encounter (Signed)
  Who's calling (name and relationship to patient) : [name] [name] ( Self)  Best contact number:[phone]  Provider they see: Dr. Fransico Michael  Reason for call: Patient called and asked if we could send her lab work to her primary care doctor in [place] she said it was [facility]. If that wont work she would like to go to [facility] but she said it was cheaper for her to go to the primary care. She would like a call to verify when it has been sent.      PRESCRIPTION REFILL ONLY  Name of prescription:  Pharmacy:

## 2020-06-03 ENCOUNTER — Ambulatory Visit (INDEPENDENT_AMBULATORY_CARE_PROVIDER_SITE_OTHER): Payer: Medicare PPO | Admitting: "Endocrinology

## 2020-06-09 ENCOUNTER — Ambulatory Visit: Payer: Self-pay | Admitting: Urology

## 2020-06-16 ENCOUNTER — Ambulatory Visit
Admission: RE | Admit: 2020-06-16 | Discharge: 2020-06-16 | Disposition: A | Discharge status: Home / Self Care | Payer: Medicare PPO | Source: Ambulatory Visit | Attending: Urology | Admitting: Urology

## 2020-06-16 ENCOUNTER — Encounter: Payer: Self-pay | Admitting: Urology

## 2020-06-16 ENCOUNTER — Other Ambulatory Visit: Payer: Self-pay

## 2020-06-16 ENCOUNTER — Ambulatory Visit: Payer: Medicare PPO | Admitting: Urology

## 2020-06-16 VITALS — BP 122/74 | HR 68 | Ht 61.0 in | Wt 219.0 lb

## 2020-06-16 DIAGNOSIS — N200 Calculus of kidney: Secondary | ICD-10-CM

## 2020-06-16 DIAGNOSIS — Z87442 Personal history of urinary calculi: Secondary | ICD-10-CM

## 2020-06-16 NOTE — Progress Notes (Signed)
06/16/2020 1:04 PM   [name] [name] 10/26/1960 [id number]  Referring provider: [name], MD [address] [name],  [place] [address]  Chief Complaint  Patient presents with  . Nephrolithiasis    Urologic history: 1.  Urolithiasis -Ureteroscopic removal 8 mm left distal ureteral calculus 10/24/2019 -Stone analysis CaOxMono/CaOxDi/hydroxyapatite 50/40/10  HPI: 60 y.o. female presents for 6 month follow-up.   Doing well since last visit, no recurrent renal colic  Occasional twinges right lower abdominal pain  KUB today shows no calcifications suspicious for urinary tract calculi  No bothersome LUTS  Denies dysuria, gross hematuria  PMH: Past Medical History:  Diagnosis Date  . Allergy to Coumadin    Not an allergy but pt declines Coumadin  . Complication of anesthesia    difficulty breathing  . Diverticulitis    August, 2013  . Ejection fraction    EF normal by bedside echo per Dr. Andee Lineman, office visit, July, 2012  . Fatigue   . Galactorrhea syndrome   . GERD (gastroesophageal reflux disease)   . Hemiparesis, right (HCC)   . History of kidney stones   . Hypertension   . Hypothyroidism, acquired, autoimmune   . Kidney stones   . Memory problem   . Migraine headache    Versus TIAs  . Patent foramen ovale    Status post device closure  at [facility]  25 mmGore Helix occluder device  . Pre-diabetes   . Stroke [facility])    Silent right brain stroke/cryptogenic stroke  . Thyroid disease    Hypothyroidism, post-radiation  . Thyroiditis, autoimmune   . Tobacco abuse   . Urinary calculus   . Vertigo   . Visual changes     Surgical History: Past Surgical History:  Procedure Laterality Date  . APPENDECTOMY  02/2019  . BACK SURGERY     X2 RODS IN BACK DURING SECOND SURGERY  . COLONOSCOPY WITH PROPOFOL N/A 01/01/2017   Procedure: COLONOSCOPY WITH PROPOFOL;  Surgeon: [name], MD;  Location: [facility] ENDOSCOPY;  Service: Endoscopy;   Laterality: N/A;  . CYSTOSCOPY W/ RETROGRADES Left 10/21/2019   Procedure: CYSTOSCOPY WITH RETROGRADE PYELOGRAM;  Surgeon: [name], MD;  Location: [facility] ORS;  Service: Urology;  Laterality: Left;  . CYSTOSCOPY/URETEROSCOPY/HOLMIUM LASER/STENT PLACEMENT Left 10/21/2019   Procedure: CYSTOSCOPY/URETEROSCOPY/HOLMIUM LASER/STENT PLACEMENT;  Surgeon: [name], MD;  Location: [facility] ORS;  Service: Urology;  Laterality: Left;  . ESOPHAGOGASTRODUODENOSCOPY (EGD) WITH PROPOFOL N/A 01/01/2017   Procedure: ESOPHAGOGASTRODUODENOSCOPY (EGD) WITH PROPOFOL;  Surgeon: [name], MD;  Location: [facility] ENDOSCOPY;  Service: Endoscopy;  Laterality: N/A;  . I-131 TREATMENT  05/25/2006  . PATENT FORAMEN OVALE(PFO) CLOSURE    . polyp removal    . TONSILLECTOMY    . TONSILLECTOMY AND ADENOIDECTOMY    . TOTAL ABDOMINAL HYSTERECTOMY  1997   No bso    Home Medications:  Allergies as of 06/16/2020      Reactions   Crestor [rosuvastatin Calcium] Other (See Comments)   Muscle cramps.   Cymbalta [duloxetine Hcl] Diarrhea   Doxycycline    REACTION: Facial numbness, Nausea   Metronidazole    REACTION: "blood vessels come up to top of skin"      Medication List       Accurate as of June 16, 2020  1:04 PM. If you have any questions, ask your nurse or doctor.        CALCIUM-VITAMIN D PO Take 1 tablet by mouth daily.   carvedilol 12.5 MG  tablet Commonly known as: COREG Take 12.5 mg by mouth 2 (two) times daily.   cetirizine 10 MG tablet Commonly known as: ZYRTEC Take 10 mg by mouth daily.   CHOLECALCIFEROL PO Take 2 each by mouth daily. Liquid drops   clopidogrel 75 MG tablet Commonly known as: PLAVIX Take 75 mg by mouth daily.   HYDROcodone-acetaminophen 5-325 MG tablet Commonly known as: NORCO/VICODIN Take 1 tablet by mouth every 6 (six) hours as needed for moderate pain.   levothyroxine 112 MCG tablet Commonly known as: SYNTHROID TAKE 1 TABLET BY MOUTH ONCE DAILY BEFORE  BREAKFAST AND TAKE AN ADDITIONAL 1/2  TABLET TWICE A  WEEK ON WEDNESDAY AND SUNDAYS   LUTEIN PO Take 1 tablet by mouth daily.   methocarbamol 500 MG tablet Commonly known as: ROBAXIN Take 500 mg by mouth every 8 (eight) hours as needed for muscle spasms.   Multi-Vitamins Tabs Take 1 tablet by mouth daily.   oxybutynin 5 MG tablet Commonly known as: DITROPAN 1 tab tid prn frequency,urgency, bladder spasm   RABEprazole 20 MG tablet Commonly known as: ACIPHEX Take 1 tablet by mouth once daily What changed:   how much to take  how to take this  when to take this  [name] to take this  additional instructions   tamsulosin 0.4 MG Caps capsule Commonly known as: FLOMAX Take 1 capsule (0.4 mg total) by mouth daily after breakfast.       Allergies:  Allergies  Allergen Reactions  . Crestor [Rosuvastatin Calcium] Other (See Comments)    Muscle cramps.  Marland Kitchen Cymbalta [Duloxetine Hcl] Diarrhea  . Doxycycline     REACTION: Facial numbness, Nausea  . Metronidazole     REACTION: "blood vessels come up to top of skin"    Family History: Family History  Problem Relation Age of Onset  . Stroke Other   . Hypertension Other   . Diabetes Mother   . Diabetes Father   . Thyroid disease Maternal Aunt   . Diabetes Maternal Grandmother   . Diabetes Paternal Grandmother   . Cancer Paternal Grandmother     Social History:  reports that she has quit smoking. Her smoking use included cigarettes. She has a 35.00 pack-year smoking history. She has never used smokeless tobacco. She reports current alcohol use. She reports that she does not use drugs.   Physical Exam: BP 122/74   Pulse 68   Ht 5\' 1"  (1.549 m)   Wt 219 lb (99.3 kg)   BMI 41.38 kg/m   Constitutional:  Alert and oriented, No acute distress. HEENT: [place] AT, moist mucus membranes.  Trachea midline, no masses. Cardiovascular: No clubbing, cyanosis, or edema. Respiratory: Normal respiratory effort, no increased work of  breathing. Psychiatric: Normal mood and affect.   Pertinent Imaging: KUB today personally reviewed and interpreted   Assessment & Plan:    1.  History urinary calculi  KUB today clear  1 year follow-up with KUB  Instructed to call earlier for recurrent pain/renal colic   , MD  [facility] [address] [place], [place] [phone]

## 2020-06-17 ENCOUNTER — Ambulatory Visit (INDEPENDENT_AMBULATORY_CARE_PROVIDER_SITE_OTHER): Payer: Medicare PPO | Admitting: "Endocrinology

## 2020-06-17 ENCOUNTER — Encounter (INDEPENDENT_AMBULATORY_CARE_PROVIDER_SITE_OTHER): Payer: Self-pay | Admitting: "Endocrinology

## 2020-06-17 VITALS — BP 128/80 | HR 74 | Wt 213.4 lb

## 2020-06-17 DIAGNOSIS — R7303 Prediabetes: Secondary | ICD-10-CM | POA: Diagnosis not present

## 2020-06-17 DIAGNOSIS — E890 Postprocedural hypothyroidism: Secondary | ICD-10-CM

## 2020-06-17 DIAGNOSIS — E211 Secondary hyperparathyroidism, not elsewhere classified: Secondary | ICD-10-CM

## 2020-06-17 DIAGNOSIS — E7800 Pure hypercholesterolemia, unspecified: Secondary | ICD-10-CM

## 2020-06-17 DIAGNOSIS — I10 Essential (primary) hypertension: Secondary | ICD-10-CM

## 2020-06-17 DIAGNOSIS — K219 Gastro-esophageal reflux disease without esophagitis: Secondary | ICD-10-CM

## 2020-06-17 DIAGNOSIS — E678 Other specified hyperalimentation: Secondary | ICD-10-CM

## 2020-06-17 LAB — POCT GLUCOSE (DEVICE FOR HOME USE): POC Glucose: 89 mg/dl (ref 70–99)

## 2020-06-17 LAB — POCT GLYCOSYLATED HEMOGLOBIN (HGB A1C): Hemoglobin A1C: 5.7 % — AB (ref 4.0–5.6)

## 2020-06-17 MED ORDER — EZETIMIBE 10 MG PO TABS: ORAL_TABLET | ORAL | 6 refills | Status: DC

## 2020-06-17 NOTE — Patient Instructions (Signed)
Follow up visit in 4 months. Please repeat lab tests 1-2 weeks prior. 

## 2020-06-17 NOTE — Progress Notes (Addendum)
CC: [name] [name] presents at this clinic visit today for follow-up hypothyroid s/p I-131 treatment, hypertension, goiter, dyspepsia, fatigue, GERD, Hashimoto's disease, status post CVA, partial right hemiparesis, hyperlipidemia, smoking, prediabetes, and supraclavicular fullness    History of present illness: [name] [name] is a 60 year old Caucasian woman.   1. [name] [name] first presented to me on 08/10/04 in referral from her primary care provider, Dr. Frederica Kuster in [place], [place], for evaluation and management of hyperthyroidism. In retrospect, the patient had chronic problems with infected sinuses, inner ear symptoms, and spinning dizziness. She also had complaints of severe fatigue and weight gain. Dr. Jonette Eva had performed thyroid tests on 06/14/04. TSH was 0.13, free T4 1.04. Thyroid ultrasound showed small and large nodules in a thyroid gland that was somewhat enlarged overall. On examination she had a 20+ gram thyroid gland. Thyroid gland was firm, but nontender. She also had some diastolic hypertension. Thyroid tests showed a TSH of 0.437, free T4 1.01, and free T3 of 2.6. Her TPO antibody level was 29 9. Her TSI level was 98. It appeared at that time that the patient likely had some elevation of thyroid hormone due to Hashimoto's disease or had a combination of intermittently active Hashimoto's Disease and intermittently active  Graves' Disease.   2. During the next 2 years, her TSH values varied from 0.350-0.854. By February of 2008, however, her TSH decreased to 0.008, her free T4 increased to 2.08, and her free T3 increased to 5.8. She was clearly hyperthyroid and thyrotoxic. TSI level at that point was 1.8 with normal being 1.3 or less. On 05/25/06 she underwent radioactive iodine treatment with I-131. She subsequently became hypothyroid and was started on Synthroid replacement therapy. Since then her doses of Synthroid have been adjusted several times.   3. During the past 12 years the patient has  had several other medical problems:  A. She has had one or more CVAs/TIAs, which have resulted in some mild right hemiparesis and some transient memory losses. The MRI of her brain in February 2010 showed evidence for remote infarcts involving the posterior right temporal lobe and parietal lobe.    B. She also had a 3-4 month episode of spontaneous galactorrhea in 2010. Brain MRI showed a 2 x 3 mm area of delayed enhancement seen posteriorly on the left side of her pituitary gland. I felt at the time that it would be very uncharacteristic for a true prolactinoma this small to actually produce hyperprolactinemia and clinical galactorrhea. The galactorrhea subsequently resolved spontaneously.   C. On 08/02/2010 she presented with increased prominence of both supraclavicular areas. A CT scan was performed which showed no significant pathology. She was evaluated by Dr. Christia Reading of [facility]. Dr. Jenne Pane could not identify any cause of the supraclavicular area prominence other than obesity. Since then, that prominence has waxed and waned with her changes in weight.    D. The patient was started on Crestor for hyperlipidemia, but initially could not tolerate even the 5 mg dose more than twice per week. She was subsequently started on Zetia, 10 mg/day, which she tolerated better, but did not control her lipids as well. Crestor was later re-started at a lower dose and she was able to tolerate that medication. However, she later discontinued the Crestor due to symptoms.   E. She has had multiple GI complaints over time, to include severe, chronic diarrhea, heartburn, and abdominal pains. In the Summer of 2015 she saw a gastroenterologist who performed a colonoscopy and removed  a polyp. He also put her on a gluten-free and casein-free diet. Her symptoms improved on that diet, but she has not been consistent with that diet for quite some time.    F. The patient has been a smoker for many years. She successfully  stopped smoking by using Chantix several years ago, but later resumed smoking. She has been very reluctant to try to stop smoking again.   G. She had ingrown toenail surgery in late September and again in November 2020.  H. On 02/12/19 she developed acute appendicitis. She had an appendectomy at [facility] in [place] (the former [facility] She was hospitalized for 10 days. She also developed pleurisy and shortness of breath. She is being followed by a doctor in [place], [place]. She stopped smoking.   I. Her abdominal CT scan showed that her kidney stone was larger prior to her March 2021 visit. She was being followed by a urologist in [place].   3. The patient's last televisit was on 09/15/19. I increased her Synthroid dose to 112 mcg/day, with an additional 1/2 pill on one day per week. I continued her calcium dose of 900 mg/day, her vitamin D dose of 3,000 IU/day, her Centrum Silver once a day, and rabeprazole, 20 mg, daily.    A. In the interim, she has been "doing all right", but "I stay tired."  B. On 10/25/19 she had a urethroscopic removal of an 8 mm calculus in the let distal ureter. The stone analysis showed CaOx/Mono/CA OxDi/hydoxyapatite. She developed a UTI a few days later. At her urology follow up exam on 06/16/20 she was doing well. She had not had any recurrence. Her KUB did not show any calcifications.   C. She still has tenderness in her RLQ and hypogastrium following her appendectomy in 2020.  D. She refuses to get her covid vaccine. She does not trust Dr. Karen Kays or Tamela Gammon. She is socially distancing and masking.   E. She still has episodic stabbing pains in her legs and feet. Both of these pains tend to occur about 3 AM. These pains sometimes limit her willingness and ability to walk. Her knees have not been too bad. She has had some ankle edema at times. Her dogs are keeping her more active walking.    E. She takes Tums for her heartburn, that is worse since stopping  rabeprazole.   F. She continues to have intermittent diarrhea, some days better and some days worse, not for the past two weeks. She is pretty good at following her gluten-free diet until she visits her sister.     G. She also has fairly consistent joint and soft tissue aches and pains. She has been having her usual low back pains.  H. She has not had any further stroke symptoms. Her neurologist continued her on Plavix, 75 mg/day.    I. She take Tums, Centrum Silver, calcium 900 mg, and 3,000 IU of vitamin D once daily. She also takes lutein, zinc, and vitamin C  J. She still has not had her sleep apnea evaluation because she does not want to wear a C-pap device like her mother wore many years ago.   K. Her allergies have been acting up more recently.  She has two dogs.       L. She is doing "fine" emotionally.   M. She is also doing well cognitively. She again responded with her classic answer, "'I'm fine. I'm not eating checkers yet."    4. Pertinent Review  of Systems: Constitutional: She says, "I'm feeling fine, except for the aches and pains." Her stamina is better, but she still gets short of breath. She still snores "like a freight train" and still frequently wakes up choking. Eyes: Vision is good with her glasses. She had an eye exam in early 2022.  Neck:  She has no complaints of anterior neck swelling, soreness, tenderness, pressure, or discomfort. She has not had any recent  trapezius spasm and pinching of the cervical nerves.   Heart: She has not had any problems with fast heart rate, chest pain, or chest pressure. 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 Her cardiologist confirmed that diagnosis at her follow up appointment in September or October 2016. The patient has no other complaints of irregular heat beats, chest pain, or chest pressure.  Gastrointestinal: Her heartburn and GERD are variable depending upon what she eats and how  much she eats. Her GERD and heartburn are better when she avoids spicy foods, like ketchup. Unfortunately she likes spicy foods and "I love ketchup."    She continues to have intermittent problems with swallowing. 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 She still has frequent, intermittent diarrhea.  Hands and arms: Her arthritis in her hands persists. : Legs: As above. She still occasionally has the sensation of something crawling on her skin in the upper right thigh and discomfort of her right patella. Muscle mass and strength seem unchanged. Her right side remains somewhat weaker than her left. She still notes some burning of her calves at times. She occasionally has pedal edema, but less now.  Feet: As above. She sometimes has numbness, burning, and "tingling" in her feet, more often on the right foot. She notes some edema at times. Her right leg has become stronger over time, so she only rarely drags her right foot anymore.  Neuro: She has persistent bilateral numbness in her thumbs and first two fingers bilaterally.  Mental/psychological: Her abilities to think, to pay attention, to remember, and to make decisions are good. "I ain't eating checkers." Skin: She has several new moles. Her dermatologist told her that they were nothing to worry about.   REVIEW OF SYSTEMS: There are no other significant problems involving her other body systems.  PAST MEDICAL, FAMILY, AND SOCIAL HISTORY:  1. Family: She still lives alone. She has frequent social interactions with friends and family. She has two sisters with hypothyroidism. Her daughter is also having thyroid problems. 2. Activities: She has been more physically active recently, in larger part due to walking her 2 dogs. .     3. Tobacco: She stopped smoking.   4. Primary care provider: Dr. Vivia Ewing, [address], [place] in [place].  5. Neurologist: Dr. Lacey Jensen, [facility], phone  [phone] 6. Cardiologist: Dr. Marny Lowenstein in [place]. She has not yet found a new cardiologist in [place].   PHYSICAL EXAM:  BP 128/80 (BP Location: Right Arm, Patient Position: Sitting, Cuff Size: Normal)   Pulse 74   Wt 213 lb 6.4 oz (96.8 kg)   BMI 40.32 kg/m   Growth percentile SmartLinks can only be used for patients less than 86 years old.  Wt Readings from Last 3 Encounters:  06/17/20 213 lb 6.4 oz (96.8 kg)  06/16/20 219 lb (99.3 kg)  12/10/19 200 lb (90.7 kg)    Ht Readings from Last 3 Encounters:  06/16/20 5\' 1"  (1.549 m)  12/10/19 5\' 1"  (1.549 m)  10/29/19 5\' 1"  (1.549 m)    HC Readings from Last 3 Encounters:  No data found for [facility]   Facility age limit for growth percentiles is 20 years.  Body mass index is 40.32 kg/m. Facility age limit for growth percentiles is 20 years.  Body surface area is 2.04 meters squared.   Constitutional: The patient looks healthy and appears physically and emotionally well. She has gained 6 pounds in 9 months. She is alert, bright, affable, perky, very sharp, and has a great sense of humor.  Eyes: There is no arcus or proptosis.  Mouth: The oropharynx appears normal. The tongue appears normal. There is normal oral moisture. There is no obvious gingivitis. Neck: There are no bruits present. The thyroid gland appears normal in size. The thyroid gland is normal at approximately 18 grams in size. The consistency of the thyroid gland is normal. There is no thyroid tenderness to palpation. Lungs: The lungs are clear. Air movement is good. Heart: The heart rhythm and rate appear normal. Heart sounds S1 and S2 are normal. I do not appreciate any pathologic heart murmurs. Abdomen: The abdominal size is enlarged. Bowel sounds are normal. The abdomen is soft. There is no obviously palpable hepatomegaly, splenomegaly, or other masses. She is tender in the RLQ.  Arms: Muscle mass appears appropriate for age.  Hands: There is no obvious tremor.  Phalangeal and metacarpophalangeal joints appear normal. Palms are normal. Legs: Muscle mass appears appropriate for age. There is no edema.  Neurologic: Muscle strength is normal for age and gender  in both the upper and the lower extremities. Muscle tone appears normal. Sensation to touch is normal in the legs.   LABS:   Labs 06/17/20: HbA1c 5.7%, CBG 89  Labs 01/05/20: HbA1c 5.8%; TSH 2.28, free T4 1.3, free T3 3.2; CMP normal, except ALT 54 (ref 6-29); PTH 22 (ref 14-64), calcium 10.2 (ref 8.6-10.4); cholesterol 242, triglycerides 79, HDL 78, LDL 146; 25-OH vitamin D 34  Labs 08/29/19; Urinalysis: 0-5 WBC, 3-10 RBC  Labs 07/30/19: Urinalysis: 0-5 WBC, 0-2 RBC  Labs 06/25/19: HbA1c 5.8%; TSH 3.50, free T4 1.2, free T3 2.7; cholesterol 255, triglycerides 123, HDL 86, LDL 144; PTH 35 (ref 14-64), calcium 9.9 (ref 8.6-10.4); 25-OH vitamin D 27 (ref 30-100)  Labs 05/13/18; PTH 35, calcium 9.7, 25-OH vitamin D 31  Labs 05/07/18: HbA1c 5.6%; TSH 1.02, free T4 1.58, free T3 2.7; CMP normal with calcium 9.5; CBC normal; cholesterol 239, triglycerides 110, HDL 75, LDL 142;   Labs 12/13/17: 07/13/18 5.5%, CBG 91  Labs 12/10/17: TSH 1.23, free T4 1.39, free T3 2.5; CMP normal; cholesterol 254, triglycerides 96, HDL 71, LDL 163  Labs 08/08/17: HbA1c 5.4%, CBG 94  Labs 08/02/17: TSH 1.17, free T4 1.28, free T3 2.3; uric acid 6.4 (ref 2.5-7.0)  Labs 05/08/17: HbA1c 5.6%, CBG 88  Labs 04/19/17: TSH 0.453, free T4 1.61, free T3 2.7; Calcium 9.8, PTH 47; C-peptide 3.3  Labs 11/01/16: HbA1c 5.7%, CBG 84; TSH 1.23, free T4 1.5, free T3 2.7; PTH 54 (ref 14-64), calcium 9.1, 25-OH vitamin D 31  Labs 08/01/16: HbA1c 5.5%; TSH 7.10, free T4 1.0. Free T3 2.2; cholesterol 283, triglycerides 101, HDL 83, LDL 180  Labs 07/06/16: CBC normal, except RBC 3.96 (ref 4.04-5.48) and MCH 32.3 (ref 27-31.2), CMP normal  Labs 04/25/16: HbA1c 5.3%, CBG 105  Labs 01/21/16: HbA1c 5.8%; calcium 9.4, PTH 52, 25-OH vitamin D 35; TSH  0.01, free T4 1.8, free T3 3.8; cholesterol 239, triglycerides 65, HDL 75, LDL 151; CMP normal  Labs 4/09/815/17/17: TSH 5.01, free T4 1.45, TPO antibody <1, anti-thyroglobulin antibody <1; CMP normal, with calcium 10.0; CBC normal  Labs 04/27/15: HbA1c 5.5%; TSH 3.61, free T4 1.4, free T3 2.2; cholesterol 258, triglycerides 96, HDL 77, LDL 162; PTH 32, calcium 10.0, 25-OH vitamin d 33  Labs 01/12/15: TSH 0.885, free T4 1.88, free T3 3.01; CBC: Normal except MCV 98; CMP normal; vitamin B12 (344 (normal 239-931), folic acid 19.7 (normal >5.4); PTH 80.49 (normal 12-72), calcium 9.10 (normal 8.5-10.6), 25-OH vitamin D 32.6; cholesterol 231, triglycerides 83, HDL 66, LDL 148  Labs 09/24/14: Ordered, but not performed  Labs 05/21/14: TSH 0.490, free T4 1.46, free T3 2.4; HbA1C 5.8%; calcium 9.3, PTH 52; 25-OH vitamin D 40; cholesterol 246, triglycerides 92, HDL 70, LDL 176; calcitonin <2  Labs 02/09/14: TSH 2.96, free T4 1.43, free T3 2.2  Labs 02/05/14: Urinalysis: large blood, 15 ketones; alkaline phosphatase 108 (normal 32-92);   Labs 02/02/14: Urinalysis: trace blood, no ketones; Hgb 13.6, Hct 43.9, MCV 100.3; CMP normal, with alkaline phosphatase 16 (normal 40-129); TSH 3.03; B12 305 (211-946); cholesterol 282, triglycerides 68, HDL 79, LDL 189; 25-hydroxy vitamin D >60  Labs 07/11/13: B12 236 (normal 211-911), calcitonin < 2 (normal < 6)  Labs 03/10/13: CMP normal. TSH 1.456, free T4 1.36, free T3 2.6; cholesterol 194, triglycerides 82, HDL 72, LDL 106  Labs 11/18/13: TSH 0.539, free T4 1.30, free T3 2.6  Labs 10/16/12: CMP normal; TSH 2.20, free T4 1.10, free T3 2.2; cholesterol 202, triglycerides 77, HDL 82, LDL 105  Labs 06/18/12: TSH 1.260, free T4 1.58, free T3 2.5, cholesterol 211, triglycerides 71, HDL 78, LDL 119  Labs 02/13/12: TSH 0.235, free T4 1.83, cholesterol 236, triglycerides 85, HDL 68, LDL 151. Had been back on Crestor for about 3 weeks, but was off it for about 7 days prior to the  labs being drawn.   Labs 01/09/12: AST 45, ALT 70, cholesterol 298, triglycerides 85, HDL 72, LDL 209, TSH 0.44, free T4 1.52, 25-hydroxy,  vitamin D 41. Had been off Crestor for months.   Labs 09/06/11: TSH 0.663, free T4 1.65, T3 117, cholesterol 225, triglycerides 65, HDL 84, LDL 128  Labs 05/09/11: TSH 1.9141.700. Free T4: 1.66. CMP is normal. Cholesterol 214, triglycerides 67, HDL was 73, LDL 128. 25-OH-vitamin D 41.7. She started Zetia as of  05/15/11.  ASSESSMENT: 1. Hypothyroid, s/p I-131:   A. Her TFTs in August 2018 were normal on her Synthroid dose of 137 mcg/day for 6 days each week.    B. In February 2019 she was mildly hyperthyroid on that same dose. After changing the dose to 112 mcg/day, her TFTs on 08/02/17 and on 12/10/17 were mid-normal. Her TFTs were again mid-normal on 05/07/18. In April 2021, however, she needed more thyroid hormone.   C. Her TFTs in November 2021 were good on her current dose of thyroid hormone.   2. Goiter: Thyroid gland was again within normal limits for size today.   3. Thyroiditis: Hashimoto's disease is clinically quiescent again today. The non-physiologic shifts in her TFTs at times are c/w intermittent Hashimoto's Dz activity. 4. Prediabetes/Elevated HbA1c: Her HbA1c was up into the prediabetes zone in August 2018, but then decreased back into the normal range. Her HbA1c was at the upper end of the normal range on 05/07/18, but was back into the prediabetes range in April 2021. Her  HbA1c in April 2022 was lower, but still in the prediabetes range.  5. Obesity: Her weight has increased more. She needs to be careful with her diet.   6. Hypertension: She needs more daily exercise.  7. Hypercholesterolemia: The patient's total cholesterol and LDL in March 2020 were better than in October 2019, but still too high. Her cholesterol in November 2021 were lower, but still too high. She might benefit from Zetia. We discussed the possible use of injectable PCSK9 inhibitor  medications at her last visit, but she was not in favor of that option. She needs to lose weight.   8. Dyspepsia/GERD: She was diagnosed with a gastric ulcer in October 2018. She did not have any ulcer symptoms. She was taking omeprazole. She feels that her reflux symptoms have improved since starting rabeprazole. Unfortunately, she stopped the rabeprazole due to concerns that the medication might be causing hematuria. Her acid symptoms are worse now.  10. Tobacco: She stopped smoking during her 10-day hospitalization and has remained "smoke free" since then.   10. Edema: She had no edema at her last visit, but has had edema at times since then. She does not have any edema today  11. 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 When she was on the diet her diarrhea stopped. When she went off the diet her diarrhea worsened. Her calcitonin was negative, ruling out hypercalcitoninemia as a cause of her diarrhea. She continues to be followed by GI. No obvious lesions were seen during her colonoscopy in October 2019. She had to cancel her planned colonoscopy in December 2020 due to her appendicitis. She has not had any diarrhea recently.  12. Excess vitamin D: Her vitamin D levels in 2016, in 2017, and again in 2018 were normal. Her vitamin D level in March 2020 was low-normal. Her vitamin D level in April 2021 was mildly low.  13. S/p stroke: Her MRI on 04/16/08 showed CNS defects in the right cerebrum. Her neurologic defects, however, were on the right side of her body. She may have had more CNS damage in her left cerebrum than had been recognized.  She seems to have recovered fairly well, but still has some right hemiparetic findings. She is doing very well emotionally and mentally.  14. Hyperparathyroidism, secondary to inadequate calcium intake: Her PTH and calcium values in August 2018 were normal, but the calcium was at about the 25% of the normal range. Her calcium and PTH values in February 2019  were good. Her calcium and PTH in March 2020 and in April 2021 and in November 2021 were normal.  15. Snoring: I suspect that she has OSA. She refuses to have a sleep study done.  16. Nasal congestion and associated symptoms: Her allergies have been acting up more this Summer, but Zyrtec helps.   17. Arthritis: I suspect that she has osteoarthritis. Her uric acid level was normal in May 2019.  18. Plantar fasciitis: Some of her foot pain in the past has been due to planter fasciitis. I suggested that she purchase some good insoles with arch support. I also suggested that she see a podiatrist.    19. Supraclavicular fullness: The prominence in the supraclavicular fossae has improved over time and was not evident at her last clinic visit. It appears that the prominence was just fatty tissue.  PLAN: 1. Diagnostic: I ordered TFTs and lipid panel to be done 1-2 weeks prior to next visit. .    2. Therapeutic: Start ezetimibe,  10 mg/day. Continue rabeprazole, 20 mg/day. Continue Synthroid dose of 112 mcg/day, with an additional 1/2 pill on one day per week. Continue calcium dose of 900 mg/day. Continue the vitamin D dose of 3,000 IU/day. Continue the Centrum Silver.  Stop smoking. Take Tums as above. 3. Patient education: We discussed her GI problems, her thyroiditis and hypothyroidism, her cholesterols, her neuro issues, her edema, and her foot problems. 4. Follow-up: Follow up visit in 4 months.  Level of Service: This visit lasted in excess of 55 minutes. More than 50% of the visit was devoted to counseling.  Molli Knock, MD, CDE Adult and Pediatric Endocrinology

## 2020-08-06 ENCOUNTER — Other Ambulatory Visit (INDEPENDENT_AMBULATORY_CARE_PROVIDER_SITE_OTHER): Payer: Self-pay | Admitting: "Endocrinology

## 2020-08-23 ENCOUNTER — Other Ambulatory Visit: Payer: Self-pay | Admitting: Urology

## 2020-08-23 DIAGNOSIS — R310 Gross hematuria: Secondary | ICD-10-CM

## 2020-08-23 DIAGNOSIS — N200 Calculus of kidney: Secondary | ICD-10-CM

## 2020-10-19 ENCOUNTER — Telehealth (INDEPENDENT_AMBULATORY_CARE_PROVIDER_SITE_OTHER): Payer: Self-pay | Admitting: "Endocrinology

## 2020-10-19 NOTE — Telephone Encounter (Signed)
  Who's calling (name and relationship to patient) : Self  Best contact number: [phone]  Provider they see: Fransico Michael  Reason for call: Stated her PCP, [facility], was supposed to fax [place] her lab results. She wants to know if we received these results. Please call her and let her know.     PRESCRIPTION REFILL ONLY  Name of prescription:  Pharmacy:

## 2020-10-19 NOTE — Telephone Encounter (Signed)
Called and let patient know that we did receive lab results and that I have placed these in Dr. Juluis Mire box for review.

## 2020-10-21 ENCOUNTER — Ambulatory Visit (INDEPENDENT_AMBULATORY_CARE_PROVIDER_SITE_OTHER): Payer: Medicare PPO | Admitting: "Endocrinology

## 2020-10-27 ENCOUNTER — Other Ambulatory Visit: Payer: Self-pay

## 2020-10-27 ENCOUNTER — Encounter (INDEPENDENT_AMBULATORY_CARE_PROVIDER_SITE_OTHER): Payer: Self-pay | Admitting: "Endocrinology

## 2020-10-27 ENCOUNTER — Ambulatory Visit (INDEPENDENT_AMBULATORY_CARE_PROVIDER_SITE_OTHER): Payer: Medicare PPO | Admitting: "Endocrinology

## 2020-10-27 VITALS — BP 118/70 | HR 68 | Wt 220.0 lb

## 2020-10-27 DIAGNOSIS — E211 Secondary hyperparathyroidism, not elsewhere classified: Secondary | ICD-10-CM

## 2020-10-27 DIAGNOSIS — R7303 Prediabetes: Secondary | ICD-10-CM

## 2020-10-27 DIAGNOSIS — I10 Essential (primary) hypertension: Secondary | ICD-10-CM | POA: Diagnosis not present

## 2020-10-27 DIAGNOSIS — E063 Autoimmune thyroiditis: Secondary | ICD-10-CM | POA: Diagnosis not present

## 2020-10-27 DIAGNOSIS — E049 Nontoxic goiter, unspecified: Secondary | ICD-10-CM

## 2020-10-27 DIAGNOSIS — R7401 Elevation of levels of liver transaminase levels: Secondary | ICD-10-CM

## 2020-10-27 DIAGNOSIS — E559 Vitamin D deficiency, unspecified: Secondary | ICD-10-CM

## 2020-10-27 LAB — POCT GLYCOSYLATED HEMOGLOBIN (HGB A1C): Hemoglobin A1C: 5.4 % (ref 4.0–5.6)

## 2020-10-27 LAB — POCT GLUCOSE (DEVICE FOR HOME USE): POC Glucose: 114 mg/dl — AB (ref 70–99)

## 2020-10-27 MED ORDER — LEVOTHYROXINE SODIUM 125 MCG PO TABS: 125.0000 ug | ORAL_TABLET | Freq: Every day | ORAL | 11 refills | Status: DC

## 2020-10-27 NOTE — Progress Notes (Addendum)
CC: [name] [name] presents at this clinic visit today for follow-up hypothyroid s/p I-131 treatment, hypertension, goiter, dyspepsia, fatigue, GERD, Hashimoto's disease, status post CVA, partial right hemiparesis, hyperlipidemia, smoking, prediabetes, and supraclavicular fullness.   History of present illness: [name] [name] is a 60 year old Caucasian woman.   1. [name] [name] first presented to me on 08/10/04 in referral from her primary care provider, Dr. Lonzo Cloud in [place], [place], for evaluation and management of hyperthyroidism. In retrospect, the patient had chronic problems with infected sinuses, inner ear symptoms, and spinning dizziness. She also had complaints of severe fatigue and weight gain. Dr. Raynelle Jan had performed thyroid tests on 06/14/04. TSH was 0.13, free T4 1.04. Thyroid ultrasound showed small and large nodules in a thyroid gland that was somewhat enlarged overall. On examination she had a 20+ gram thyroid gland. Thyroid gland was firm, but nontender. She also had some diastolic hypertension. Thyroid tests showed a TSH of 0.437, free T4 1.01, and free T3 of 2.6. Her TPO antibody level was 29 9. Her TSI level was 98. It appeared at that time that the patient likely had some elevation of thyroid hormone due to Hashimoto's disease or had a combination of intermittently active Hashimoto's Disease and intermittently active  Graves' Disease.   2. During the next 2 years, her TSH values varied from 0.350-0.854. By February of 2008, however, her TSH decreased to 0.008, her free T4 increased to 2.08, and her free T3 increased to 5.8. She was clearly hyperthyroid and thyrotoxic. TSI level at that point was 1.8 with normal being 1.3 or less. On 05/25/06 she underwent radioactive iodine treatment with I-131. She subsequently became hypothyroid and was started on Synthroid replacement therapy. Since then her doses of Synthroid have been adjusted several times.   3. During the past 12 years the patient has  had several other medical problems:  A. She has had one or more CVAs/TIAs, which have resulted in some mild right hemiparesis and some transient memory losses. The MRI of her brain in February 2010 showed evidence for remote infarcts involving the posterior right temporal lobe and parietal lobe.    B. She also had a 3-4 month episode of spontaneous galactorrhea in 2010. Brain MRI showed a 2 x 3 mm area of delayed enhancement seen posteriorly on the left side of her pituitary gland. I felt at the time that it would be very uncharacteristic for a true prolactinoma this small to actually produce hyperprolactinemia and clinical galactorrhea. The galactorrhea subsequently resolved spontaneously.   C. On 08/02/2010 she presented with increased prominence of both supraclavicular areas. A CT scan was performed which showed no significant pathology. She was evaluated by Dr. Melida Quitter of [facility]. Dr. Redmond Baseman could not identify any cause of the supraclavicular area prominence other than obesity. Since then, that prominence has waxed and waned with her changes in weight.    D. The patient was started on Crestor for hyperlipidemia, but initially could not tolerate even the 5 mg dose more than twice per week. She was subsequently started on Zetia, 10 mg/day, which she tolerated better, but did not control her lipids as well. Crestor was later re-started at a lower dose and she was able to tolerate that medication. However, she later discontinued the Crestor due to symptoms.   E. She has had multiple GI complaints over time, to include severe, chronic diarrhea, heartburn, and abdominal pains. In the Summer of 2015 she saw a gastroenterologist who performed a colonoscopy and removed a  polyp. He also put her on a gluten-free and casein-free diet. Her symptoms improved on that diet, but she has not been consistent with that diet for quite some time.    F. The patient has been a smoker for many years. She successfully  stopped smoking by using Chantix several years ago, but later resumed smoking. She has been very reluctant to try to stop smoking again.   G. She had ingrown toenail surgery in late September and again in November 2020.  H. On 02/12/19 she developed acute appendicitis. She had an appendectomy at [facility] in [place] (the former [facility] She was hospitalized for 10 days. She also developed pleurisy and shortness of breath. She is being followed by a doctor in [place], [place]. She stopped smoking.   I. Her abdominal CT scan showed that her kidney stone was larger prior to her March 2021 visit. She was being followed by a urologist in [place].   J. On 10/25/19 she had a urethroscopic removal of an 8 mm calculus in the let distal ureter. The stone analysis showed CaOx/Mono/CA OxDi/hydoxyapatite. She developed a UTI a few days later. At her urology follow up exam on 06/16/20 she was doing well.  3. The patient's last televisit was on 06/17/20. I wanted her to start ezetimibe, 10 mg/day after the visit, but she did not start it until after her July lab tests. I increased her Synthroid dose to 112 mcg/day, with an additional 1/2 pill on one day per week. I continued her calcium dose of 900 mg/day, her vitamin D dose of 3,000 IU/day, her Centrum Silver once a day, and rabeprazole, 20 mg, daily.    A. In the interim, she had a mild covid infection in May.  She now is tired and sleepy and her hair is falling out. Her legs feel "leaden".  B.  She had not had any recurrence of kidney stones. Her KUB did not show any calcifications.   C. She still has tenderness in her RLQ and hypogastrium following her appendectomy in 2020.  D. She refuses to get her covid vaccine. She does not trust Dr. Karen Kays or Tamela Gammon. She is socially distancing and masking.   E. She still has episodic stabbing pains in her legs and feet. Both of these pains tend to occur about 3 AM. These pains sometimes limit her willingness and  ability to walk. Her knees have not been too bad. She has had some ankle edema at times. Her dogs are keeping her more active walking.    E. She takes Tums for her heartburn, that is worse since stopping rabeprazole.   F. She continues to have intermittent diarrhea, some days better and some days worse, not for the past two weeks. She no longer follows her gluten-free diet consistently.      G. She also has fairly consistent joint and soft tissue aches and pains. She has been having her usual low back pain  H. She has not had any further stroke symptoms. Her neurologist continued her on Plavix, 75 mg/day.    I. She take Tums, Centrum Silver, calcium 900 mg, and 3,000 IU of vitamin D once daily. She also takes lutein, zinc, and vitamin C  J. She still has not had her sleep apnea evaluation because she does not want to wear a C-pap device like her mother wore many years ago.   K. Her allergies have been acting up more recently.  She has two dogs.  L. She is doing "all right" emotionally.   M. She is also doing well cognitively. She again responded with her classic answer, "'I'm fine. I'm not eating checkers yet."    4. Pertinent Review of Systems: Constitutional: She says, "I'm feeling fine, except for the aches and pains." Her stamina is better, but she still gets short of breath. She still snores "like a freight train" and still frequently wakes up choking. Eyes: Vision is good with her glasses. She had an eye exam in early 2022.  Neck:  She has no complaints of anterior neck swelling, soreness, tenderness, pressure, or discomfort. She has not had any recent  trapezius spasm and pinching of the cervical nerves.   Heart: She has not had any problems with fast heart rate, chest pain, or chest pressure. 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 Her cardiologist confirmed that diagnosis at her follow up appointment in September or October 2016.  The patient has no other complaints of irregular heat beats, chest pain, or chest pressure. She has dyspnea on exertion and sometimes at rest.  Gastrointestinal: Her heartburn and GERD are variable depending upon what she eats and how much she eats. Her GERD and heartburn are better when she avoids spicy foods, like ketchup. Unfortunately she likes spicy foods and "I love ketchup."    She continues to have intermittent problems with swallowing. 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 She still has frequent, intermittent diarrhea.  Hands and arms: Her arthritis in her hands persists. : Legs: As above. She still occasionally has the sensation of something crawling on her skin in the upper right thigh and discomfort of her right patella. Muscle mass and strength seem unchanged. Her right side remains somewhat weaker than her left. She still notes some burning of her calves at times. She occasionally has pedal edema, but less now.  Feet: As above. She sometimes has numbness, burning, and "tingling" in her feet, more often on the right foot. She notes some edema at times. Her right leg has become stronger over time, so she only rarely drags her right foot anymore.  Neuro: She has persistent bilateral numbness in her thumbs and first two fingers bilaterally.  Mental/psychological: Her abilities to think, to pay attention, to remember, and to make decisions are good. She again smiled and said, "I ain't eating checkers." Skin: She has several new moles. Her dermatologist told her that they were nothing to worry about.   REVIEW OF SYSTEMS: There are no other significant problems involving her other body systems.  PAST MEDICAL, FAMILY, AND SOCIAL HISTORY:  1. Family: She still lives alone. She has frequent social interactions with friends and family. She has two sisters with hypothyroidism. Her daughter is also having thyroid problems. 2. Activities: She has been less  physically active recently, in larger part due to covid and to being more tired.     3. Tobacco: She stopped smoking.   4. Primary care provider: Ms Cordella Register, NP in [place].  5. Neurologist: Dr. Lacey Jensen, [facility], phone [phone] 6. Cardiologist: Dr. Marny Lowenstein in [place]. She has not yet found a new cardiologist in [place].   PHYSICAL EXAM:  BP 118/70 (BP Location: Right Arm, Patient Position: Sitting, Cuff Size: Normal)   Pulse 68   Wt 220 lb (99.8 kg)   BMI 41.57 kg/m   Growth percentile SmartLinks  can only be used for patients less than 16 years old.  Wt Readings from Last 3 Encounters:  10/27/20 220 lb (99.8 kg)  06/17/20 213 lb 6.4 oz (96.8 kg)  06/16/20 219 lb (99.3 kg)    Ht Readings from Last 3 Encounters:  06/16/20  (1.549 m)  12/10/19  (1.549 m)  10/29/19  (1.549 m)    HC Readings from Last 3 Encounters:  No data found for [facility]   Facility age limit for growth percentiles is 20 years.  Body mass index is 41.57 kg/m. Facility age limit for growth percentiles is 20 years.  Body surface area is 2.07 meters squared.   Constitutional: The patient looks healthy and appears physically and emotionally well. She has gained 7 pounds in 9 months. She is alert, bright, affable, perky, very sharp, and has a great sense of humor.  Eyes: There is no arcus or proptosis.  Mouth: The oropharynx appears normal. The tongue appears normal. There is normal oral moisture. There is no obvious gingivitis. Neck: There are no bruits present. The thyroid gland appears normal in size. The thyroid gland is normal at approximately 18 grams in size. The consistency of the thyroid gland is normal. There is no thyroid tenderness to palpation. Lungs: The lungs are clear. Air movement is good. Heart: The heart rhythm and rate appear normal. Heart sounds S1 and S2 are normal. I do not appreciate any pathologic heart murmurs. Abdomen: The abdomen is  enlarged. Bowel sounds are normal. The abdomen is soft. There is no obviously palpable hepatomegaly, splenomegaly, or other masses. She is not tender in the abdomen today.  Arms: Muscle mass appears appropriate for age.  Hands: There is no obvious tremor. Phalangeal and metacarpophalangeal joints appear normal. Palms are normal. Legs: Muscle mass appears appropriate for age. There is no edema.  Neurologic: Muscle strength is normal for age and gender  in both the upper and the lower extremities. Muscle tone appears normal. Sensation to touch is normal in the legs.   LABS:   Labs 10/27/20: HbA1c 5.4%, CBG 114  Labs 09/21/20: TSH 6.31, T4 7.5 (ef 4.5-12), CMP normal, except AST 44 (ref 0-40) and ALT 81 (ref 0-31); cholesterol 273, triglycerides 103, HDL 84, LDL 172; PTH 56 (ref 15-65), calcium 9.8, 25-OH vitamin D 36.1; CBC normal;   Labs 06/17/20: HbA1c 5.7%, CBG 89  Labs 01/05/20: HbA1c 5.8%; TSH 2.28, free T4 1.3, free T3 3.2; CMP normal, except ALT 54 (ref 6-29); PTH 22 (ref 14-64), calcium 10.2 (ref 8.6-10.4); cholesterol 242, triglycerides 79, HDL 78, LDL 146; 25-OH vitamin D 34  Labs 08/29/19; Urinalysis: 0-5 WBC, 3-10 RBC  Labs 07/30/19: Urinalysis: 0-5 WBC, 0-2 RBC  Labs 06/25/19: HbA1c 5.8%; TSH 3.50, free T4 1.2, free T3 2.7; cholesterol 255, triglycerides 123, HDL 86, LDL 144; PTH 35 (ref 14-64), calcium 9.9 (ref 8.6-10.4); 25-OH vitamin D 27 (ref 30-100)  Labs 05/13/18; PTH 35, calcium 9.7, 25-OH vitamin D 31  Labs 05/07/18: HbA1c 5.6%; TSH 1.02, free T4 1.58, free T3 2.7; CMP normal with calcium 9.5; CBC normal; cholesterol 239, triglycerides 110, HDL 75, LDL 142;   Labs 12/13/17: ZOX0R 5.5%, CBG 91  Labs 12/10/17: TSH 1.23, free T4 1.39, free T3 2.5; CMP normal; cholesterol 254, triglycerides 96, HDL 71, LDL 163  Labs 08/08/17: HbA1c 5.4%, CBG 94  Labs 08/02/17: TSH 1.17, free T4 1.28, free T3 2.3; uric acid 6.4 (ref 2.5-7.0)  Labs 05/08/17: HbA1c 5.6%, CBG 88  Labs 04/19/17:  TSH  0.453, free T4 1.61, free T3 2.7; Calcium 9.8, PTH 47; C-peptide 3.3  Labs 11/01/16: HbA1c 5.7%, CBG 84; TSH 1.23, free T4 1.5, free T3 2.7; PTH 54 (ref 14-64), calcium 9.1, 25-OH vitamin D 31  Labs 08/01/16: HbA1c 5.5%; TSH 7.10, free T4 1.0. Free T3 2.2; cholesterol 283, triglycerides 101, HDL 83, LDL 180  Labs 07/06/16: CBC normal, except RBC 3.96 (ref 4.04-5.48) and MCH 32.3 (ref 27-31.2), CMP normal  Labs 04/25/16: HbA1c 5.3%, CBG 105  Labs 01/21/16: HbA1c 5.8%; calcium 9.4, PTH 52, 25-OH vitamin D 35; TSH 0.01, free T4 1.8, free T3 3.8; cholesterol 239, triglycerides 65, HDL 75, LDL 151; CMP normal  Labs 1/44/31: TSH 5.01, free T4 1.45, TPO antibody <1, anti-thyroglobulin antibody <1; CMP normal, with calcium 10.0; CBC normal  Labs 04/27/15: HbA1c 5.5%; TSH 3.61, free T4 1.4, free T3 2.2; cholesterol 258, triglycerides 96, HDL 77, LDL 162; PTH 32, calcium 10.0, 25-OH vitamin d 33  Labs 01/12/15: TSH 0.885, free T4 1.88, free T3 3.01; CBC: Normal except MCV 98; CMP normal; vitamin B12 (344 (normal 239-931), folic acid 19.7 (normal >5.4); PTH 80.49 (normal 12-72), calcium 9.10 (normal 8.5-10.6), 25-OH vitamin D 32.6; cholesterol 231, triglycerides 83, HDL 66, LDL 148  Labs 09/24/14: Ordered, but not performed  Labs 05/21/14: TSH 0.490, free T4 1.46, free T3 2.4; HbA1C 5.8%; calcium 9.3, PTH 52; 25-OH vitamin D 40; cholesterol 246, triglycerides 92, HDL 70, LDL 176; calcitonin <2  Labs 02/09/14: TSH 2.96, free T4 1.43, free T3 2.2  Labs 02/05/14: Urinalysis: large blood, 15 ketones; alkaline phosphatase 108 (normal 32-92);   Labs 02/02/14: Urinalysis: trace blood, no ketones; Hgb 13.6, Hct 43.9, MCV 100.3; CMP normal, with alkaline phosphatase 16 (normal 40-129); TSH 3.03; B12 305 (211-946); cholesterol 282, triglycerides 68, HDL 79, LDL 189; 25-hydroxy vitamin D >60  Labs 07/11/13: B12 236 (normal 211-911), calcitonin < 2 (normal < 6)  Labs 03/10/13: CMP normal. TSH 1.456, free T4 1.36, free  T3 2.6; cholesterol 194, triglycerides 82, HDL 72, LDL 106  Labs 11/18/13: TSH 0.539, free T4 1.30, free T3 2.6  Labs 10/16/12: CMP normal; TSH 2.20, free T4 1.10, free T3 2.2; cholesterol 202, triglycerides 77, HDL 82, LDL 105  Labs 06/18/12: TSH 1.260, free T4 1.58, free T3 2.5, cholesterol 211, triglycerides 71, HDL 78, LDL 119  Labs 02/13/12: TSH 0.235, free T4 1.83, cholesterol 236, triglycerides 85, HDL 68, LDL 151. Had been back on Crestor for about 3 weeks, but was off it for about 7 days prior to the labs being drawn.   Labs 01/09/12: AST 45, ALT 70, cholesterol 298, triglycerides 85, HDL 72, LDL 209, TSH 0.44, free T4 1.52, 25-hydroxy,  vitamin D 41. Had been off Crestor for months.   Labs 09/06/11: TSH 0.663, free T4 1.65, T3 117, cholesterol 225, triglycerides 65, HDL 84, LDL 128  Labs 05/09/11: TSH 5.400. Free T4: 1.66. CMP is normal. Cholesterol 214, triglycerides 67, HDL was 73, LDL 128. 25-OH-vitamin D 41.7. She started Zetia as of  05/15/11.  ASSESSMENT: 1. Hypothyroid, s/p I-131:   A. Her TFTs in August 2018 were normal on her Synthroid dose of 137 mcg/day for 6 days each week.    B. In February 2019 she was mildly hyperthyroid on that same dose. After changing the dose to 112 mcg/day, her TFTs on 08/02/17 and on 12/10/17 were mid-normal. Her TFTs were again mid-normal on 05/07/18. In April 2021, however, she needed more thyroid hormone.   C.  Her TFTs in November 2021 were good on her current dose of thyroid hormone.  D. Her TFTS in July were too low. She needs more thyroid hormone.   2. Goiter: Thyroid gland was again within normal limits for size today.   3. Thyroiditis: Hashimoto's disease is clinically quiescent again today. The non-physiologic shifts in her TFTs at times are c/w intermittent Hashimoto's Dz activity. 4. Prediabetes/Elevated HbA1c: Her HbA1c was up into the prediabetes zone in August 2018, but then decreased back into the normal range. Her HbA1c was at the upper  end of the normal range on 05/07/18, but was back into the prediabetes range in April 2021. Her HbA1c in April 2022 was lower, but still in the prediabetes range. Her HbA1c in August 2022 was in the normal range.  5. Obesity: Her weight has increased more. She needs to be careful with her diet.   6. Hypertension: Her BP is good today.   7. Hypercholesterolemia: The patient's total cholesterol and LDL in March 2020 were better than in October 2019, but still too high. Her cholesterol in November 2021 were lower, but still too high. Her cholesterols in July before she started ezetimibe were higher.She might benefit from Zetia. We discussed the possible use of injectable PCSK9 inhibitor medications at her last visit, but she was not in favor of that option. She needs to lose weight.   8. Dyspepsia/GERD: She was diagnosed with a gastric ulcer in October 2018. She did not have any ulcer symptoms. She was taking omeprazole. She feels that her reflux symptoms have improved since starting rabeprazole. Unfortunately, she only takes the medication when she feels she needs them. Her acid symptoms are worse now.  10. Tobacco: She stopped smoking during her 10-day hospitalization and has remained "smoke free" since then.   10. Edema: She had no edema at her last visit or at today's visit.  11. 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 When she was on the diet her diarrhea stopped. When she went off the diet her diarrhea worsened. Her calcitonin was negative, ruling out hypercalcitoninemia as a cause of her diarrhea. She continues to be followed by GI. No obvious lesions were seen during her colonoscopy in October 2019. She had to cancel her planned colonoscopy in December 2020 due to her appendicitis. She has not had any diarrhea recently.  12. Excess vitamin D: Her vitamin D levels in 2016, in 2017, and again in 2018 were normal. Her vitamin D level in March 2020 was low-normal. Her vitamin D level in April  2021 was mildly low. Her vitamin D level in July 2022 was normal.   13. S/p stroke: Her MRI on 04/16/08 showed CNS defects in the right cerebrum. Her neurologic defects, however, were on the right side of her body. She may have had more CNS damage in her left cerebrum than had been recognized.  She seems to have recovered fairly well, but still has some right hemiparetic findings. She is doing very well emotionally and mentally.  14. Hyperparathyroidism, secondary to inadequate calcium intake: Her PTH and calcium values in August 2018 were normal, but the calcium was at about the 25% of the normal range. Her calcium and PTH values in February 2019 were good. Her calcium and PTH in March 2020, in April 2021, in November 2021, and in July 2022 were normal.  15. Snoring: I suspect that she has OSA. She refuses to have a sleep study done.  16. Nasal congestion and  associated symptoms: Her allergies have been acting up more this Summer, but Zyrtec helps.   17. Arthritis: I suspect that she has osteoarthritis. Her uric acid level was normal in May 2019.  18. Plantar fasciitis: Some of her foot pain in the past has been due to planter fasciitis. I suggested that she purchase some good insoles with arch support. I also suggested that she see a podiatrist.    19. Supraclavicular fullness: The prominence in the supraclavicular fossae has improved over time and was not evident at her last clinic visit. It appears that the prominence was just fatty tissue.  PLAN: 1. Diagnostic: I ordered TFTs and lipid panel to be done in 8 weeks.    2. Therapeutic: Continue ezetimibe, 10 mg/day. Continue rabeprazole, 20 mg/day. Increase  Synthroid dose to 125 mcg/day. Continue calcium dose of 900 mg/day. Continue the vitamin D dose of 3,000 IU/day. Continue the Centrum Silver.  Stop smoking. Take Tums as above. 3. Patient education: We discussed her GI problems, her thyroiditis and hypothyroidism, her cholesterols, her neuro issues,  her edema, and her foot problems. 4. Follow-up: Follow up visit in 4 months.  Level of Service: This visit lasted in excess of 65 minutes. More than 50% of the visit was devoted to counseling.  Molli KnockMichael Arkin Imran, MD, CDE Adult and Pediatric Endocrinology

## 2020-10-27 NOTE — Patient Instructions (Signed)
Follow up visit in 3 months. Please perform thyroid tests in mid October 2022.

## 2020-12-01 DIAGNOSIS — S82899A Other fracture of unspecified lower leg, initial encounter for closed fracture: Secondary | ICD-10-CM

## 2020-12-01 HISTORY — DX: Other fracture of unspecified lower leg, initial encounter for closed fracture: S82.899A

## 2021-01-12 ENCOUNTER — Other Ambulatory Visit (INDEPENDENT_AMBULATORY_CARE_PROVIDER_SITE_OTHER): Payer: Self-pay | Admitting: "Endocrinology

## 2021-01-13 LAB — LIPID PANEL W/O CHOL/HDL RATIO
Cholesterol, Total: 218 mg/dL — ABNORMAL HIGH (ref 100–199)
HDL: 77 mg/dL (ref >39)
LDL Chol Calc (NIH): 125 mg/dL — ABNORMAL HIGH (ref 0–99)
Triglycerides: 89 mg/dL (ref 0–149)
VLDL Cholesterol Cal: 16 mg/dL (ref 5–40)

## 2021-01-13 LAB — FREE THYROXINE + T4
Free T4: 1.5 ng/dL (ref 0.82–1.77)
T4, Total: 10.1 ug/dL (ref 4.5–12.0)

## 2021-01-13 LAB — T3, FREE: T3, Free: 3.1 pg/mL (ref 2.0–4.4)

## 2021-01-13 LAB — TSH: TSH: 0.393 u[IU]/mL — ABNORMAL LOW (ref 0.450–4.500)

## 2021-01-31 ENCOUNTER — Encounter (INDEPENDENT_AMBULATORY_CARE_PROVIDER_SITE_OTHER): Payer: Self-pay

## 2021-01-31 ENCOUNTER — Telehealth (INDEPENDENT_AMBULATORY_CARE_PROVIDER_SITE_OTHER): Payer: Self-pay | Admitting: "Endocrinology

## 2021-01-31 NOTE — Progress Notes (Signed)
CC: [name]. Ingram presents at this clinic visit today for follow-up hypothyroid s/p I-131 treatment, hypertension, goiter, dyspepsia, fatigue, GERD, Hashimoto's disease, status post CVA, partial right hemiparesis, hyperlipidemia, smoking, prediabetes, and supraclavicular fullness.   History of present illness: [name] [name] is a 60 year old Caucasian woman.   1. [name] [name] first presented to me on 08/10/04 in referral from her primary care provider, Dr. Lonzo Cloud in [place], [place], for evaluation and management of hyperthyroidism. In retrospect, the patient had chronic problems with infected sinuses, inner ear symptoms, and spinning dizziness. She also had complaints of severe fatigue and weight gain. Dr. Raynelle Jan had performed thyroid tests on 06/14/04. TSH was 0.13, free T4 1.04. Thyroid ultrasound showed small and large nodules in a thyroid gland that was somewhat enlarged overall. On examination she had a 20+ gram thyroid gland. Thyroid gland was firm, but nontender. She also had some diastolic hypertension. Thyroid tests showed a TSH of 0.437, free T4 1.01, and free T3 of 2.6. Her TPO antibody level was 29 9. Her TSI level was 98. It appeared at that time that the patient likely had some elevation of thyroid hormone due to Hashimoto's disease or had a combination of intermittently active Hashimoto's Disease and intermittently active  Graves' Disease.   2. During the next 2 years, her TSH values varied from 0.350-0.854. By February of 2008, however, her TSH decreased to 0.008, her free T4 increased to 2.08, and her free T3 increased to 5.8. She was clearly hyperthyroid and thyrotoxic. TSI level at that point was 1.8 with normal being 1.3 or less. On 05/25/06 she underwent radioactive iodine treatment with I-131. She subsequently became hypothyroid and was started on Synthroid replacement therapy. Since then her doses of Synthroid have been adjusted several times.   3. During the past 12 years the patient has  had several other medical problems:  A. She has had one or more CVAs/TIAs, which have resulted in some mild right hemiparesis and some transient memory losses. The MRI of her brain in February 2010 showed evidence for remote infarcts involving the posterior right temporal lobe and parietal lobe.    B. She also had a 3-4 month episode of spontaneous galactorrhea in 2010. Brain MRI showed a 2 x 3 mm area of delayed enhancement seen posteriorly on the left side of her pituitary gland. I felt at the time that it would be very uncharacteristic for a true prolactinoma this small to actually produce hyperprolactinemia and clinical galactorrhea. The galactorrhea subsequently resolved spontaneously.   C. On 08/02/2010 she presented with increased prominence of both supraclavicular areas. A CT scan was performed which showed no significant pathology. She was evaluated by Dr. Melida Quitter of [facility]. Dr. Redmond Baseman could not identify any cause of the supraclavicular area prominence other than obesity. Since then, that prominence has waxed and waned with her changes in weight.    D. The patient was started on Crestor for hyperlipidemia, but initially could not tolerate even the 5 mg dose more than twice per week. She was subsequently started on Zetia, 10 mg/day, which she tolerated better, but did not control her lipids as well. Crestor was later re-started at a lower dose and she was able to tolerate that medication. However, she later discontinued the Crestor due to symptoms.   E. She has had multiple GI complaints over time, to include severe, chronic diarrhea, heartburn, and abdominal pains. In the Summer of 2015 she saw a gastroenterologist who performed a colonoscopy and removed a  polyp. He also put her on a gluten-free and casein-free diet. Her symptoms improved on that diet, but she has not been consistent with that diet for quite some time.    F. The patient has been a smoker for many years. She successfully  stopped smoking by using Chantix several years ago, but later resumed smoking. She has been very reluctant to try to stop smoking again.   G. She had ingrown toenail surgery in late September and again in November 2020.  H. On 02/12/19 she developed acute appendicitis. She had an appendectomy at [facility] in [place] (the former [facility] She was hospitalized for 10 days. She also developed pleurisy and shortness of breath. She is being followed by a doctor in [place], [place]. She stopped smoking.   I. Her abdominal CT scan showed that her kidney stone was larger prior to her March 2021 visit. She was being followed by a urologist in [place].   J. On 10/25/19 she had a urethroscopic removal of an 8 mm calculus in the let distal ureter. The stone analysis showed CaOx/Mono/CA OxDi/hydoxyapatite. She developed a UTI a few days later. At her urology follow up exam on 06/16/20 she was doing well.  K. She had a mild covid infection in May 2022.  4. The patient's last endocrine clinic visit was on 10/27/20. I continued her ezetimibe, 10 mg/day.. I increased her Synthroid dose to 125 mcg/day. I continued her calcium dose of 900 mg/day, her vitamin D dose of 3,000 IU/day, her Centrum Silver once a day, and rabeprazole, 20 mg, daily.    A. In the interim, she fractured her right ankle when she stepped in a hole about 6 weeks ago. She is now in a foot and ankle brace. She is having more difficulty walking.  B. She is still fairly tired and sleepy. She is not having much hair loss.   C.  She had not had any recurrence of kidney stones. Her KUB did not show any calcifications.   D. She still has tenderness in her RLQ and hypogastrium following her appendectomy in 2020.  E. She refuses to get her covid vaccine. She does not trust Dr. Brenton Grills or Flossie Dibble. She is socially distancing and masking.   F. She still has episodic stabbing pains in her legs and feet. Both of these pains tend to occur about 3 AM. These  pains sometimes limit her willingness and ability to walk. Her knees have not been too bad. She has had some ankle edema at times. Her dogs were keeping her more active walking until her recent injury.    G. She takes Tums for her heartburn. She resumed taking rabeprazole after having had more acid reflux. The medication is helping her. She has stopped drinking sodas. She is not craving sweets as much. She is eating at home more and eating out less.    H. She is not having much intermittent diarrhea. She is trying to follow the gluten-free diet more consistently.      I. She also has fairly consistent joint and soft tissue aches and pains. She has been having her usual low back pain  J. She has not had any further stroke symptoms. Her neurologist continued her on Plavix, 75 mg/day.    K. She take Tums, Centrum Silver, calcium 900 mg, and 3,000 IU of vitamin D once daily. She also takes lutein, zinc, and vitamin C  L. She still has not had her sleep apnea evaluation because she  does not want to wear a C-pap device like her mother wore many years ago.   M. Her allergies have been acting up more recently.  She has two dogs.       N. She is doing "all right" emotionally.   O. She is also doing well cognitively. She again responded with her classic answer, "I'm fine. I'm not eating checkers yet."    5. Pertinent Review of Systems: Constitutional: She says, "I'm feeling fine, except for the aches and pains and the right ankle." Her stamina is better, but she still gets short of breath. She still snores "like a freight train" and still frequently wakes up choking. Eyes: Vision is good with her glasses. She had an eye exam in early 2022.  Neck:  She has no complaints of anterior neck swelling, soreness, tenderness, pressure, or discomfort. She has not had any recent  trapezius spasm and pinching of the cervical nerves.   Heart: She has not had any problems with fast heart rate, chest pain, or chest pressure.  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 Her cardiologist confirmed that diagnosis at her follow up appointment in September or October 2016. The patient has no other complaints of irregular heat beats, chest pain, or chest pressure. She has dyspnea on exertion and sometimes at rest.  Gastrointestinal: Her heartburn and GERD are variable depending upon what she eats and how much she eats. Her GERD and heartburn are better when she avoids spicy foods, like ketchup and when she takes rabeprazole.  Unfortunately she likes spicy foods and "I love ketchup."    She continues to have intermittent problems with swallowing. 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 She still has frequent, intermittent diarrhea.  Hands and arms: Her arthritis in her hands persists. : Legs: As above. She still occasionally has the sensation of something crawling on her skin in the upper right thigh and discomfort of her right patella. Muscle mass and strength seem unchanged. Her right side remains somewhat weaker than her left. She still notes some burning of her calves at times. She occasionally has pedal edema, but less now.  Feet: As above. She sometimes has numbness, burning, and "tingling" in her feet, more often on the right foot. She notes some edema at times. Her right leg has become stronger over time, so she only rarely drags her right foot anymore.  Neuro: She has persistent bilateral numbness in her thumbs and first two fingers bilaterally.  Mental/psychological: Her abilities to think, to pay attention, to remember, and to make decisions are good. She again smiled and said, "I ain't eating checkers." Skin: She has several moles. Her dermatologist told her that they were nothing to worry about.   REVIEW OF SYSTEMS: There are no other significant problems involving her other body systems.  PAST MEDICAL, FAMILY, AND SOCIAL  HISTORY:  1. Family: She still lives alone, except for her dog. She has frequent social interactions with friends and family. She has two sisters with hypothyroidism. Her daughter is also having thyroid problems. 2. Activities: She has been less physically active recently.     3. Tobacco: She stopped smoking in December 2020.   4. Primary care provider: Ms Hughes Better, NP in [place].  5. Neurologist: Dr. Wendi Snipes, [facility], phone [phone] 6. Cardiologist: Dr. Daiva Huge in [place]. She has not yet found a  new cardiologist in [place].   PHYSICAL EXAM:  BP 126/70 (BP Location: Right Arm, Patient Position: Sitting, Cuff Size: Normal)   Pulse 66   Wt 225 lb 3.2 oz (102.2 kg)   BMI 42.55 kg/m   Growth percentile SmartLinks can only be used for patients less than 30 years old.  Wt Readings from Last 3 Encounters:  02/01/21 225 lb 3.2 oz (102.2 kg)  10/27/20 220 lb (99.8 kg)  06/17/20 213 lb 6.4 oz (96.8 kg)    Ht Readings from Last 3 Encounters:  06/16/20 5\' 1"  (1.549 m)  12/10/19 5\' 1"  (1.549 m)  10/29/19 5\' 1"  (1.549 m)    HC Readings from Last 3 Encounters:  No data found for [facility]   Facility age limit for growth percentiles is 20 years.  Body mass index is 42.55 kg/m. Facility age limit for growth percentiles is 20 years.  Body surface area is 2.1 meters squared.   Constitutional: The patient looks healthy, a bit heavier, and appears physically and emotionally well. She has gained 5 pounds in 3 months. She is alert, bright, affable, perky, very sharp, and has a great sense of humor. It is always a pleasure to see her.  Eyes: There is no arcus or proptosis.  Mouth: The oropharynx appears normal. The tongue appears normal. There is normal oral moisture. There is no obvious gingivitis. Neck: There are no bruits present. The thyroid gland appears normal in size. The thyroid gland is normal at approximately 18 grams in size. The consistency of the  thyroid gland is normal. There is no thyroid tenderness to palpation. Lungs: The lungs are clear. Air movement is good. Heart: The heart rhythm and rate appear normal. Heart sounds S1 and S2 are normal. I do not appreciate any pathologic heart murmurs. Abdomen: The abdomen is enlarged. Bowel sounds are normal. The abdomen is soft. There is no obviously palpable hepatomegaly, splenomegaly, or other masses. She is tender in the RLQ.  Arms: Muscle mass appears appropriate for age.  Hands: There is no obvious tremor. Phalangeal and metacarpophalangeal joints appear normal. Palms are normal. Legs: Muscle mass appears appropriate for age. There is no edema.  Feet: The right foot and ankle are bandaged and braced. Her left foot is normal, has 2+ DP pulse and is sensitive to touch in the ball and heel.  Neurologic: Muscle strength is normal for age and gender  in both the upper and the lower extremities. Muscle tone appears normal. Sensation to touch is normal in the legs.   LABS:   Labs 02/01/21: HbA1c 5.5%, CBG 86  Labs 01/12/21: TSH 0.393, free T4 1.3, free T3 3.1; cholesterol 218,  triglycerides 89, HDL 77, LDL 125   Labs 10/27/20: HbA1c 5.4%, CBG 114  Labs 09/21/20: TSH 6.31, T4 7.5 (ef 4.5-12), CMP normal, except AST 44 (ref 0-40) and ALT 81 (ref 0-31); cholesterol 273, triglycerides 103, HDL 84, LDL 172; PTH 56 (ref 15-65), calcium 9.8, 25-OH vitamin D 36.1; CBC normal;   Labs 06/17/20: HbA1c 5.7%, CBG 89  Labs 01/05/20: HbA1c 5.8%; TSH 2.28, free T4 1.3, free T3 3.2; CMP normal, except ALT 54 (ref 6-29); PTH 22 (ref 14-64), calcium 10.2 (ref 8.6-10.4); cholesterol 242, triglycerides 79, HDL 78, LDL 146; 25-OH vitamin D 34  Labs 08/29/19; Urinalysis: 0-5 WBC, 3-10 RBC  Labs 07/30/19: Urinalysis: 0-5 WBC, 0-2 RBC  Labs 06/25/19: HbA1c 5.8%; TSH 3.50, free T4 1.2, free T3 2.7; cholesterol 255, triglycerides 123, HDL 86, LDL 144; PTH 35 (  ref 14-64), calcium 9.9 (ref 8.6-10.4); 25-OH vitamin D 27  (ref 30-100)  Labs 05/13/18; PTH 35, calcium 9.7, 25-OH vitamin D 31  Labs 05/07/18: HbA1c 5.6%; TSH 1.02, free T4 1.58, free T3 2.7; CMP normal with calcium 9.5; CBC normal; cholesterol 239, triglycerides 110, HDL 75, LDL 142;   Labs 12/13/17: HbA1c 5.5%, CBG 91  Labs 12/10/17: TSH 1.23, free T4 1.39, free T3 2.5; CMP normal; cholesterol 254, triglycerides 96, HDL 71, LDL 163  Labs 08/08/17: HbA1c 5.4%, CBG 94  Labs 08/02/17: TSH 1.17, free T4 1.28, free T3 2.3; uric acid 6.4 (ref 2.5-7.0)  Labs 05/08/17: HbA1c 5.6%, CBG 88  Labs 04/19/17: TSH 0.453, free T4 1.61, free T3 2.7; Calcium 9.8, PTH 47; C-peptide 3.3  Labs 11/01/16: HbA1c 5.7%, CBG 84; TSH 1.23, free T4 1.5, free T3 2.7; PTH 54 (ref 14-64), calcium 9.1, 25-OH vitamin D 31  Labs 08/01/16: HbA1c 5.5%; TSH 7.10, free T4 1.0. Free T3 2.2; cholesterol 283, triglycerides 101, HDL 83, LDL 180  Labs 07/06/16: CBC normal, except RBC 3.96 (ref 4.04-5.48) and MCH 32.3 (ref 27-31.2), CMP normal  Labs 04/25/16: HbA1c 5.3%, CBG 105  Labs 01/21/16: HbA1c 5.8%; calcium 9.4, PTH 52, 25-OH vitamin D 35; TSH 0.01, free T4 1.8, free T3 3.8; cholesterol 239, triglycerides 65, HDL 75, LDL 151; CMP normal  Labs 07/21/15: TSH 5.01, free T4 1.45, TPO antibody <1, anti-thyroglobulin antibody <1; CMP normal, with calcium 10.0; CBC normal  Labs 04/27/15: HbA1c 5.5%; TSH 3.61, free T4 1.4, free T3 2.2; cholesterol 258, triglycerides 96, HDL 77, LDL 162; PTH 32, calcium 10.0, 25-OH vitamin d 33  Labs 01/12/15: TSH 0.885, free T4 1.88, free T3 3.01; CBC: Normal except MCV 98; CMP normal; vitamin B12 (344 (normal [id number]), folic acid 0000000 (normal >5.4); PTH 80.49 (normal 12-72), calcium 9.10 (normal 8.5-10.6), 25-OH vitamin D 32.6; cholesterol 231, triglycerides 83, HDL 66, LDL 148  Labs 09/24/14: Ordered, but not performed  Labs 05/21/14: TSH 0.490, free T4 1.46, free T3 2.4; HbA1C 5.8%; calcium 9.3, PTH 52; 25-OH vitamin D 40; cholesterol 246, triglycerides 92,  HDL 70, LDL 176; calcitonin <2  Labs 02/09/14: TSH 2.96, free T4 1.43, free T3 2.2  Labs 02/05/14: Urinalysis: large blood, 15 ketones; alkaline phosphatase 108 (normal 32-92);   Labs 02/02/14: Urinalysis: trace blood, no ketones; Hgb 13.6, Hct 43.9, MCV 100.3; CMP normal, with alkaline phosphatase 16 (normal 40-129); TSH 3.03; B12 305 (211-946); cholesterol 282, triglycerides 68, HDL 79, LDL 189; 25-hydroxy vitamin D >60  Labs 07/11/13: B12 236 (normal 211-911), calcitonin < 2 (normal < 6)  Labs 03/10/13: CMP normal. TSH 1.456, free T4 1.36, free T3 2.6; cholesterol 194, triglycerides 82, HDL 72, LDL 106  Labs 11/18/13: TSH 0.539, free T4 1.30, free T3 2.6  Labs 10/16/12: CMP normal; TSH 2.20, free T4 1.10, free T3 2.2; cholesterol 202, triglycerides 77, HDL 82, LDL 105  Labs 06/18/12: TSH 1.260, free T4 1.58, free T3 2.5, cholesterol 211, triglycerides 71, HDL 78, LDL 119  Labs 02/13/12: TSH 0.235, free T4 1.83, cholesterol 236, triglycerides 85, HDL 68, LDL 151. Had been back on Crestor for about 3 weeks, but was off it for about 7 days prior to the labs being drawn.   Labs 01/09/12: AST 45, ALT 70, cholesterol 298, triglycerides 85, HDL 72, LDL 209, TSH 0.44, free T4 1.52, 25-hydroxy,  vitamin D 41. Had been off Crestor for months.   Labs 09/06/11: TSH 0.663, free T4 1.65, T3 117, cholesterol 225, triglycerides  65, HDL 84, LDL 128  Labs 05/09/11: TSH 1.700. Free T4: 1.66. CMP is normal. Cholesterol 214, triglycerides 67, HDL was 73, LDL 128. 25-OH-vitamin D 41.7. She started Zetia as of  05/15/11.  ASSESSMENT: 1. Hypothyroid, s/p I-131:   A. Her TFTs in August 2018 were normal on her Synthroid dose of 137 mcg/day for 6 days each week.    B. In February 2019 she was mildly hyperthyroid on that same dose. After changing the dose to 112 mcg/day, her TFTs on 08/02/17 and on 12/10/17 were mid-normal. Her TFTs were again mid-normal on 05/07/18. In April 2021, however, she needed more thyroid hormone.    C. Her TFTs in November 2021 were good on her current dose of thyroid hormone.  D. Her TFTs in July were too low. She needed more thyroid hormone.   E. Her TFTs in November 2022 were a bit high, but she is clinically euthyroid. We will continue this current dose of levothyroxine and see if she loses more thyrocytes at her next visit.  2. Goiter: Thyroid gland was again within normal limits for size today.   3. Thyroiditis: Hashimoto's disease is clinically quiescent again today. The non-physiologic shifts in her TFTs at times are c/w intermittent Hashimoto's Dz activity. 4. Prediabetes/Elevated HbA1c: Her HbA1c was up into the prediabetes zone in August 2018, but then decreased back into the normal range. Her HbA1c was at the upper end of the normal range on 05/07/18, but was back into the prediabetes range in April 2021. Her HbA1c in April 2022 was lower, but still in the prediabetes range. Her HbA1c in August 2022 was in the normal range. Her HbA1c is a bit higher in November 2022, paralleling her weight gain.  5. Obesity: Her weight has increased more. She needs to be careful with her diet.   6. Hypertension: Her BP is good today.   7. Hypercholesterolemia: The patient's total cholesterol and LDL in March 2020 were better than in October 2019, but still too high. Her cholesterol in November 2021 were lower, but still too high. Her cholesterols were higher in July 2022 before she started ezetimibe. Her labs in November were improved. We discussed the possible use of injectable PCSK9 inhibitor medications at her last visit, but she was not in favor of that option. She needs to lose weight.   8. Dyspepsia/GERD: She was diagnosed with a gastric ulcer in October 2018. She did not have any ulcer symptoms. She was taking omeprazole. She feels that her reflux symptoms have improved since re-starting rabeprazole.  10. Tobacco: She stopped smoking during her 10-day hospitalization in December 2020 and has  remained "smoke free" since then.   10. Edema: She had no edema at her last visit or at today's visit.  11. 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 When she was on the diet her diarrhea stopped. When she went off the diet her diarrhea worsened. Her calcitonin was negative, ruling out hypercalcitoninemia as a cause of her diarrhea. She continues to be followed by GI. No obvious lesions were seen during her colonoscopy in October 2019. She had to cancel her planned colonoscopy in December 2020 due to her appendicitis. She has not had any diarrhea recently.  12. Excess vitamin D: Her vitamin D levels in 2016, in 2017, and again in 2018 were normal. Her vitamin D level in March 2020 was low-normal. Her vitamin D level in April 2021 was mildly low. Her vitamin D level in July 2022  was normal.   13. S/p stroke: Her MRI on 04/16/08 showed CNS defects in the right cerebrum. Her neurologic defects, however, were on the right side of her body. She may have had more CNS damage in her left cerebrum than had been recognized.  She seems to have recovered fairly well, but still has some right hemiparetic findings. She is doing very well emotionally and mentally.  14. Hyperparathyroidism, secondary to inadequate calcium intake: Her PTH and calcium values in August 2018 were normal, but the calcium was at about the 25% of the normal range. Her calcium and PTH values in February 2019 were good. Her calcium and PTH in March 2020, in April 2021, in November 2021, and in July 2022 were normal.  15. Snoring: I suspect that she has OSA. She refuses to have a sleep study done.  16. Nasal congestion and associated symptoms: Her allergies have been acting up more this Summer, but Zyrtec helps.   17. Arthritis: I suspect that she has osteoarthritis. Her uric acid level was normal in May 2019.  18. Plantar fasciitis: Some of her foot pain in the past has been due to planter fasciitis. I suggested that she purchase some  good insoles with arch support. I also suggested that she see a podiatrist.    19. Supraclavicular fullness: The prominence in the supraclavicular fossae has improved over time and was not evident at her last clinic visit. It appears that the prominence was just fatty tissue.  PLAN: 1. Diagnostic: I ordered TFTs and lipid panel to be done in 8 weeks.    2. Therapeutic: Continue ezetimibe, 10 mg/day. Continue rabeprazole, 20 mg/day. Increase  Synthroid dose to 125 mcg/day. Continue calcium dose of 900 mg/day. Continue the vitamin D dose of 3,000 IU/day. Continue the Centrum Silver.  Take Tums as above. 3. Patient education: We discussed her GI problems, her thyroiditis and hypothyroidism, her cholesterols, her neuro issues, her edema, and her foot problems. 4. Follow-up: Follow up visit in 4 months.  Level of Service: This visit lasted in excess of 65 minutes. More than 50% of the visit was devoted to counseling.  Molli Knock, MD, CDE Adult and Pediatric Endocrinology

## 2021-01-31 NOTE — Telephone Encounter (Signed)
  Who's calling (name and relationship to patient) : Sruthi Maurer. Guarnieri; self  Best contact number: [phone]  Provider they see: Dr. Fransico Michael  Reason for call: Called patient to confirm appt for tomorrow, she has confirmed, but also wanted to make sure that the blood work was received.    PRESCRIPTION REFILL ONLY  Name of prescription:  Pharmacy:

## 2021-02-01 ENCOUNTER — Ambulatory Visit (INDEPENDENT_AMBULATORY_CARE_PROVIDER_SITE_OTHER): Payer: Medicare PPO | Admitting: "Endocrinology

## 2021-02-01 ENCOUNTER — Other Ambulatory Visit: Payer: Self-pay

## 2021-02-01 ENCOUNTER — Encounter (INDEPENDENT_AMBULATORY_CARE_PROVIDER_SITE_OTHER): Payer: Self-pay | Admitting: "Endocrinology

## 2021-02-01 VITALS — BP 126/70 | HR 66 | Wt 225.2 lb

## 2021-02-01 DIAGNOSIS — E559 Vitamin D deficiency, unspecified: Secondary | ICD-10-CM

## 2021-02-01 DIAGNOSIS — E049 Nontoxic goiter, unspecified: Secondary | ICD-10-CM | POA: Diagnosis not present

## 2021-02-01 DIAGNOSIS — R7303 Prediabetes: Secondary | ICD-10-CM | POA: Diagnosis not present

## 2021-02-01 DIAGNOSIS — E063 Autoimmune thyroiditis: Secondary | ICD-10-CM | POA: Diagnosis not present

## 2021-02-01 DIAGNOSIS — R600 Localized edema: Secondary | ICD-10-CM

## 2021-02-01 DIAGNOSIS — E890 Postprocedural hypothyroidism: Secondary | ICD-10-CM

## 2021-02-01 DIAGNOSIS — E782 Mixed hyperlipidemia: Secondary | ICD-10-CM

## 2021-02-01 LAB — POCT GLUCOSE (DEVICE FOR HOME USE): POC Glucose: 86 mg/dl (ref 70–99)

## 2021-02-01 LAB — POCT GLYCOSYLATED HEMOGLOBIN (HGB A1C): Hemoglobin A1C: 5.5 % (ref 4.0–5.6)

## 2021-02-01 NOTE — Patient Instructions (Signed)
Follow up visit in 4 months. Please repeat fasting lab tests in two months.

## 2021-02-24 ENCOUNTER — Other Ambulatory Visit (INDEPENDENT_AMBULATORY_CARE_PROVIDER_SITE_OTHER): Payer: Self-pay | Admitting: "Endocrinology

## 2021-05-26 ENCOUNTER — Other Ambulatory Visit (INDEPENDENT_AMBULATORY_CARE_PROVIDER_SITE_OTHER): Payer: Self-pay | Admitting: "Endocrinology

## 2021-05-28 IMAGING — CR DG ABDOMEN 1V
1 series · 2 of 2 positions shown · non-contrast
Comparison: 10/24/2019

CLINICAL DATA: Follow-up kidney stones

EXAM:
ABDOMEN - 1 VIEW

[Series 1: dg abd 1 view · 0.14mm/px · 2 of 2 slices shown]
[im 1/2]
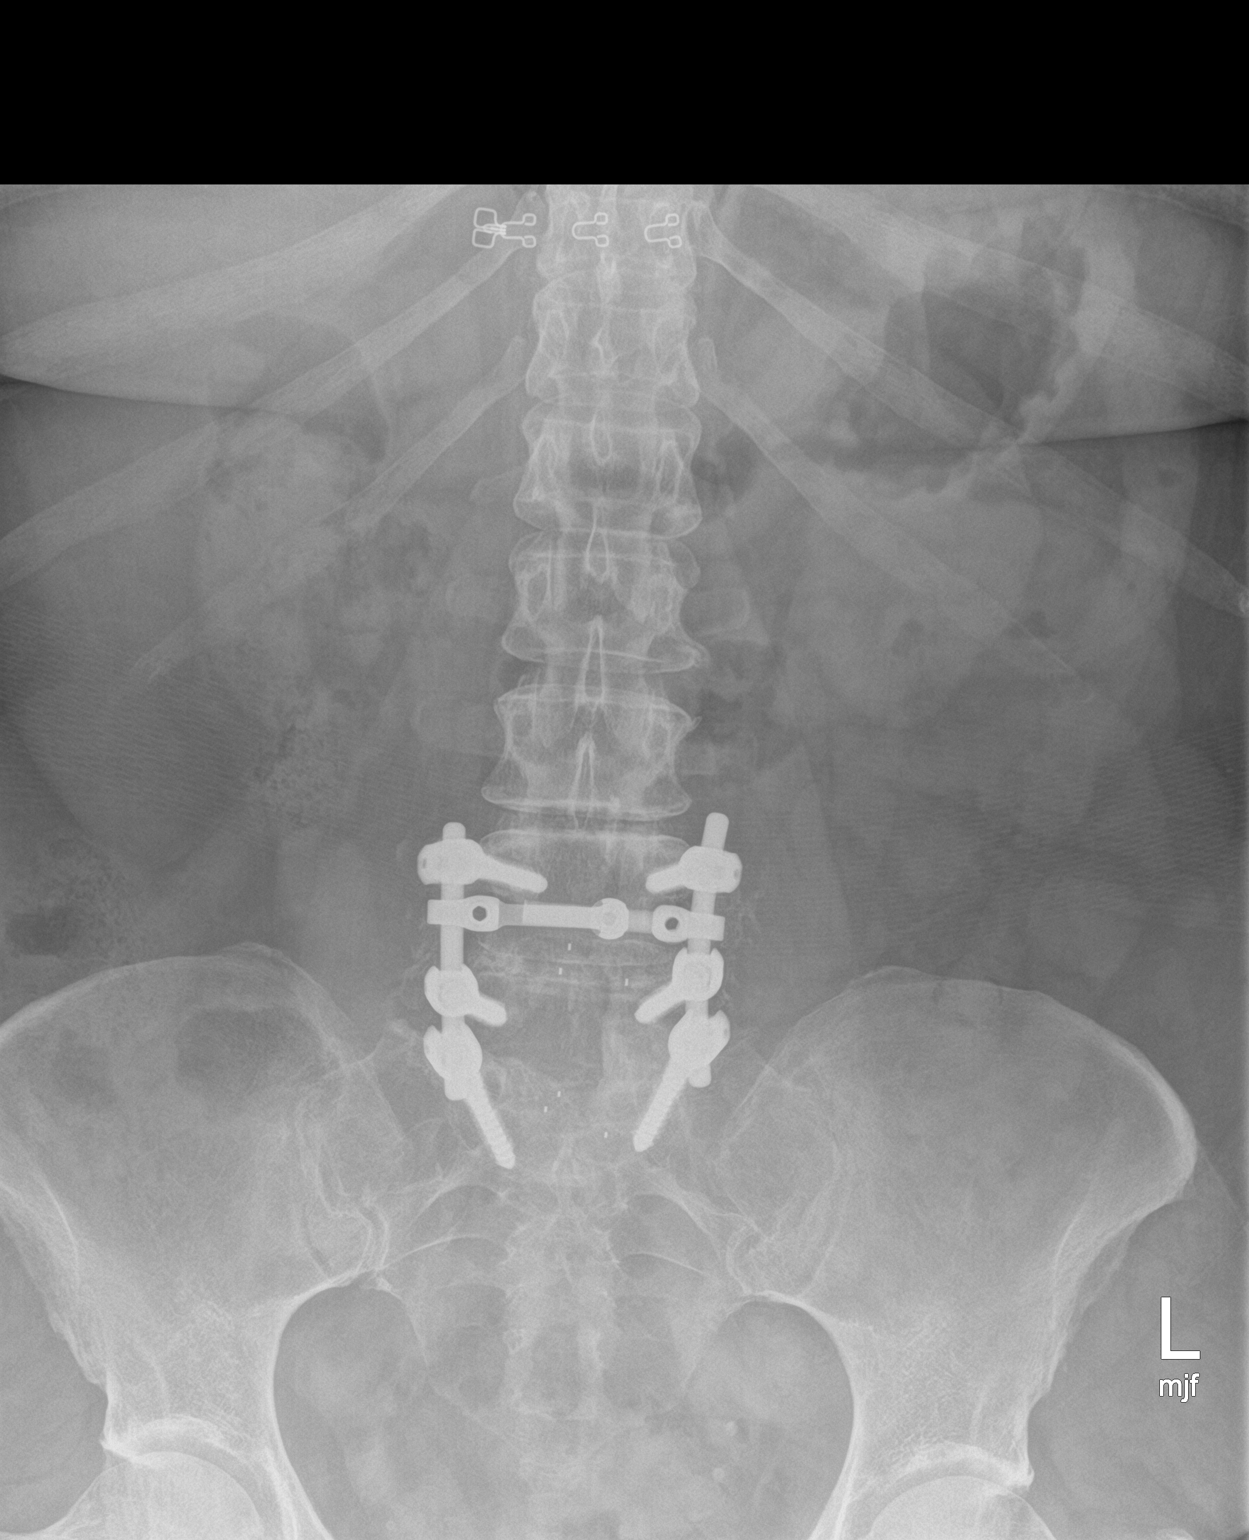
[im 2/2]
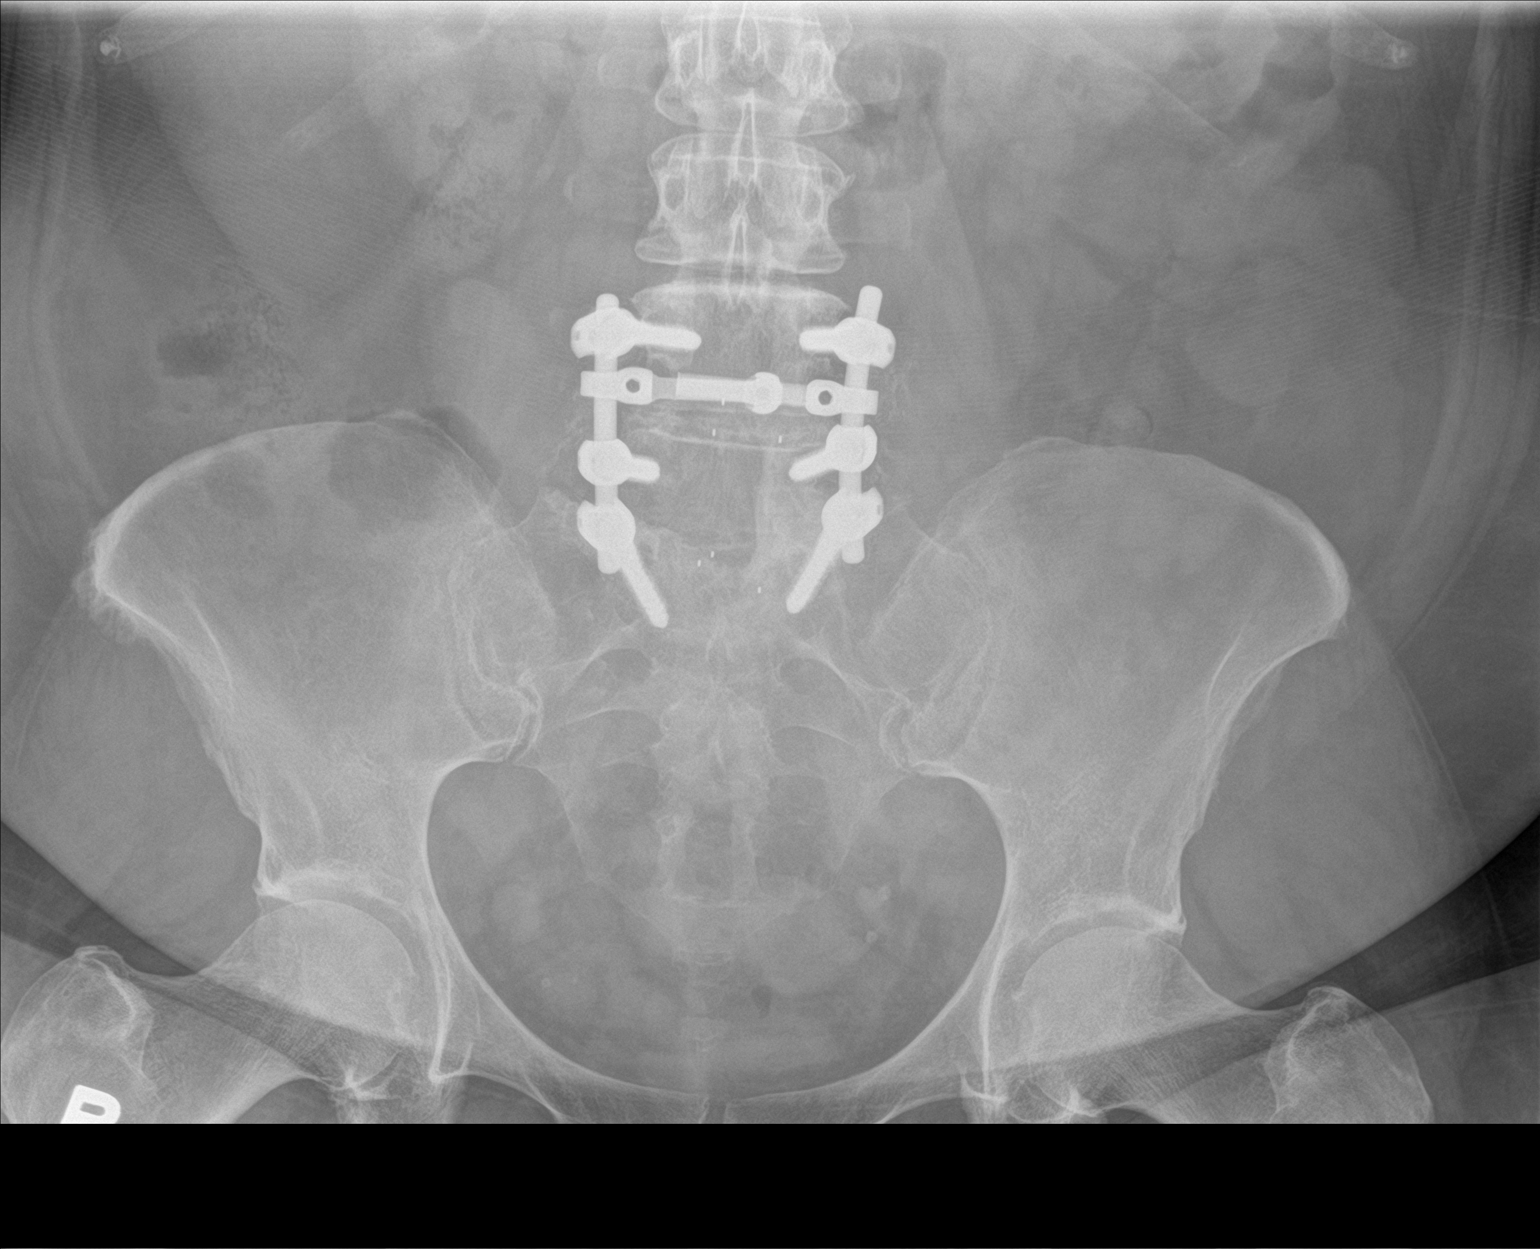

[2 of 2 positions shown; findings below may reference images not displayed]

FINDINGS: Interval removal of LEFT ureteral stent.

Small phleboliths stable.

No definite urinary tract calcifications identified.

Bowel gas pattern normal.

Prior lumbar fusion L4-S1 with L5 laminectomy.

Osseous demineralization.
IMPRESSION: No definite urinary tract calcifications identified.

## 2021-06-02 ENCOUNTER — Ambulatory Visit (INDEPENDENT_AMBULATORY_CARE_PROVIDER_SITE_OTHER): Payer: Medicare PPO | Admitting: "Endocrinology

## 2021-06-17 ENCOUNTER — Other Ambulatory Visit: Payer: Self-pay | Admitting: *Deleted

## 2021-06-17 DIAGNOSIS — N200 Calculus of kidney: Secondary | ICD-10-CM

## 2021-06-20 ENCOUNTER — Ambulatory Visit: Payer: Medicare PPO | Admitting: Urology

## 2021-06-20 ENCOUNTER — Encounter: Payer: Self-pay | Admitting: Urology

## 2021-07-13 ENCOUNTER — Other Ambulatory Visit (INDEPENDENT_AMBULATORY_CARE_PROVIDER_SITE_OTHER): Payer: Self-pay | Admitting: "Endocrinology

## 2021-07-20 NOTE — Progress Notes (Signed)
CC: Ms. Heacock presents at this clinic visit today for follow-up hypothyroid s/p I-131 treatment, hypertension, goiter, dyspepsia, fatigue, GERD, Hashimoto's disease, status post CVA, partial right hemiparesis, hyperlipidemia, smoking, prediabetes, and supraclavicular fullness.   History of present illness: Ms. Record is a 61 year old Caucasian woman.   1. Ms Molla first presented to me on 08/10/04 in referral from her primary care provider, Dr. Lonzo Cloud in [place], [place], for evaluation and management of hyperthyroidism. In retrospect, the patient had chronic problems with infected sinuses, inner ear symptoms, and spinning dizziness. She also had complaints of severe fatigue and weight gain. Dr. Raynelle Jan had performed thyroid tests on 06/14/04. TSH was 0.13, free T4 1.04. Thyroid ultrasound showed small and large nodules in a thyroid gland that was somewhat enlarged overall. On examination she had a 20+ gram thyroid gland. Thyroid gland was firm, but nontender. She also had some diastolic hypertension. Thyroid tests showed a TSH of 0.437, free T4 1.01, and free T3 of 2.6. Her TPO antibody level was 29 9. Her TSI level was 98. It appeared at that time that the patient likely had some elevation of thyroid hormone due to Hashimoto's disease or had a combination of intermittently active Hashimoto's Disease and intermittently active Graves' Disease.   2. During the next 2 years, her TSH values varied from 0.350-0.854. By February of 2008, however, her TSH decreased to 0.008, her free T4 increased to 2.08, and her free T3 increased to 5.8. She was clearly hyperthyroid and thyrotoxic. TSI level at that point was 1.8 with normal being 1.3 or less. On 05/25/06 she underwent radioactive iodine treatment with I-131. She subsequently became hypothyroid and was started on Synthroid replacement therapy. Since then her doses of Synthroid have been adjusted many times.   3. During the past 15 years the patient has had  several other medical problems:  A. She has had one or more CVAs/TIAs, which have resulted in some mild right hemiparesis and some transient memory losses. The MRI of her brain in February 2010 showed evidence for remote infarcts involving the posterior right temporal lobe and parietal lobe.    B. She also had a 3-4 month episode of spontaneous galactorrhea in 2010. Brain MRI showed a 2 x 3 mm area of delayed enhancement seen posteriorly on the left side of her pituitary gland. I felt at the time that it would be very uncharacteristic for a true prolactinoma this small to actually produce hyperprolactinemia and clinical galactorrhea. The galactorrhea subsequently resolved spontaneously.   C. On 08/02/2010 she presented with increased prominence of both supraclavicular areas. A CT scan was performed which showed no significant pathology. She was evaluated by Dr. Melida Quitter of [facility]. Dr. Redmond Baseman could not identify any cause of the supraclavicular area prominence other than obesity. Since then, that prominence has waxed and waned with her changes in weight.    D. The patient was started on Crestor for hyperlipidemia, but initially could not tolerate even the 5 mg dose more than twice per week. She was subsequently started on Zetia, 10 mg/day, which she tolerated better, but did not control her lipids as well. Crestor was later re-started at a lower dose and she was able to tolerate that medication. However, she later discontinued the Crestor due to symptoms.   E. She has had multiple GI complaints over time, to include severe, chronic diarrhea, heartburn, and abdominal pains. In the Summer of 2015 she saw a gastroenterologist who performed a colonoscopy and removed a polyp.  He also put her on a gluten-free and casein-free diet. Her symptoms improved on that diet, but she has not been consistent with that diet for quite some time.    F. The patient has been a smoker for many years. She successfully  stopped smoking by using Chantix several years ago, but later resumed smoking. She eventually stopped smoking in December 2020.   G. She had ingrown toenail surgery in late September and again in November 2020.  H. On 02/12/19 she developed acute appendicitis. She had an appendectomy at [facility] in [place] (the former [facility] She was hospitalized for 10 days. She also developed pleurisy and shortness of breath. She is being followed by a doctor in [place], [place]. She stopped smoking.   I. Her abdominal CT scan showed that her kidney stone was larger prior to her March 2021 visit. She was being followed by a urologist in [place].   J. On 10/25/19 she had a urethroscopic removal of an 8 mm calculus in the let distal ureter. The stone analysis showed CaOx/Mono/CA OxDi/hydroxyapatite. She developed a UTI a few days later. At her urology follow up exam on 06/16/20 she was doing well.  K. She had a mild covid infection in May 2022.  L. In October 2022 she fractured her right ankle when she stepped in a hole.   4. The patient's last endocrine clinic visit was on 02/01/21. I continued her ezetimibe, 10 mg/day. I increased her Synthroid dose to 125 mcg/day. I continued her calcium dose of 900 mg/day, her vitamin D dose of 3,000 IU/day, her Centrum Silver once a day, and rabeprazole, 20 mg, daily.  I ordered lab tests to be done in 8 weeks, but they were not done.  A. In the interim, she had a tick bite on her abdomen on Monday, 07/18/21. The area has become red and swollen.  B. She tripped up the steps several weeks ago and injured her left neck, left arm, and lower leg. The swelling has resolved. She still has neck pain. C. She is still fairly tired and sleepy. She is having more hair loss.   D.  She had not had any recurrence of kidney stones. Her KUB did not show any calcifications.   E. She still has tenderness in her RLQ and hypogastrium following her appendectomy in 2020.   F. She refuses to get  her covid vaccine. She does not trust Dr. Brenton Grills or Flossie Dibble. She was socially distancing and masking.   G. She still has episodic stabbing pains in her legs and feet. Both of these pains tend to occur about 3 AM. These pains sometimes limit her willingness and ability to walk. Her knees have not been too bad. She has had some ankle edema at times. Her dogs were keeping her more active walking until her recent injury.    H. She takes Tums for her heartburn. She resumed taking rabeprazole after having had more acid reflux. The medication is helping her. She has stopped drinking sodas. She is not craving sweets as much. She is eating at home more and eating out less.    I. She is not having much intermittent diarrhea. She is trying to follow the gluten-free diet more consistently.      J. She fairly consistent has joint and soft tissue aches and pains. She has been having her usual low back pain  K. She has not had any further stroke symptoms. Her neurologist continued her on Plavix, 75 mg/day.  L. She take Tums, Centrum Silver, calcium 900 mg, and 3,000 IU of vitamin D once daily. She also takes lutein, zinc, and vitamin C  M. She still has not had her sleep apnea evaluation because she does not want to wear a C-pap device like her mother wore many years ago.   N. Her allergies have been acting up more recently.  She has two dogs.       O. She is doing "fine" emotionally.   P. She is also doing well cognitively. She again responded with her classic answer, "I'm fine. I'm not eating checkers yet."    5. Pertinent Review of Systems: Constitutional: She says, "I'm feeling fine, except for the aches and pains." Her stamina is better, but she still gets short of breath. She still snores "like a freight train" and still frequently wakes up choking. Eyes: Vision is good with her glasses. She had an eye exam in early 2022.  Neck:  She has no complaints of anterior neck swelling, soreness, tenderness,  pressure, or discomfort. She has not had any recent  trapezius spasm and pinching of the cervical nerves.   Heart: She has not had any problems with fast heart rate, chest pain, or chest pressure. 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 Her cardiologist confirmed that diagnosis at her follow up appointment in September or October 2016. The patient has no other complaints of irregular heat beats, chest pain, or chest pressure. She has dyspnea on exertion and sometimes at rest.  Gastrointestinal: Her heartburn and GERD are variable depending upon what she eats and how much she eats. Her GERD and heartburn are better when she avoids spicy foods, like ketchup and when she takes rabeprazole.  Unfortunately she likes spicy foods and "I love ketchup."    She continues to have intermittent problems with swallowing. 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 She still has frequent, intermittent diarrhea.  Hands and arms: Her arthritis in her hands persists. : Legs: As above. She still occasionally has the sensation of something crawling on her skin in the upper right thigh and discomfort of her right patella. Muscle mass and strength seem unchanged. Her right side remains somewhat weaker than her left. She still notes some burning of her calves at times. She occasionally has pedal edema, but less now.  Feet: As above. She sometimes has numbness, burning, and "tingling" in her feet, more often on the right foot. She notes some edema at times. Her right leg has become stronger over time, so she only rarely drags her right foot anymore.  Neuro: She has persistent bilateral numbness in her thumbs and first two fingers bilaterally.  Mental/psychological: Her abilities to think, to pay attention, to remember, and to make decisions are good. She again smiled and said, "I ain't eating checkers." Skin: She has several moles. Her  dermatologist told her that they were nothing to worry about.   REVIEW OF SYSTEMS: There are no other significant problems involving her other body systems.  PAST MEDICAL, FAMILY, AND SOCIAL HISTORY:  1. Family: She still lives alone, except for her dog. She has frequent social interactions with friends and family. She has two sisters with hypothyroidism. Her daughter is also having thyroid problems. 2. Activities: She has been less physically active recently.     3. Tobacco: She stopped smoking in December 2020.  4. Primary care provider: Ms Hughes Better, NP in [place].  5. Neurologist: Dr. Wendi Snipes, [facility], phone [phone] 6. Cardiologist: Dr. Daiva Huge in [place]. She has not yet found a new cardiologist in [place].   PHYSICAL EXAM:  BP 124/88 (BP Location: Left Arm, Patient Position: Sitting, Cuff Size: Large)   Pulse 72   Wt 227 lb 12.8 oz (103.3 kg)   BMI 43.04 kg/m   Growth percentile SmartLinks can only be used for patients less than 77 years old.  Wt Readings from Last 3 Encounters:  07/21/21 227 lb 12.8 oz (103.3 kg)  02/01/21 225 lb 3.2 oz (102.2 kg)  10/27/20 220 lb (99.8 kg)    Ht Readings from Last 3 Encounters:  06/16/20 5\' 1"  (1.549 m)  12/10/19 5\' 1"  (1.549 m)  10/29/19 5\' 1"  (1.549 m)    HC Readings from Last 3 Encounters:  No data found for [facility]   Facility age limit for growth percentiles is 20 years.  Body mass index is 43.04 kg/m. Facility age limit for growth percentiles is 20 years.  Body surface area is 2.11 meters squared.   Constitutional: The patient looks healthy, a bit heavier, and appears physically and emotionally well. She has gained 2 pounds in 6 months. She is alert, bright, affable, perky, very sharp, and has a great sense of humor. It is always a pleasure to see her. She loves to talk. Her BP is higher after driving around looking for a parking place. Eyes: There is no arcus or proptosis.  Mouth: The  oropharynx appears normal. The tongue appears normal. There is normal oral moisture. There is no obvious gingivitis. Neck: There are no bruits present. The thyroid gland appears normal in size. The thyroid gland is normal at approximately 18 grams in size. The consistency of the thyroid gland is normal. There is no thyroid tenderness to palpation. Lungs: The lungs are clear. Air movement is good. Heart: The heart rhythm and rate appear normal. Heart sounds S1 and S2 are normal. I do not appreciate any pathologic heart murmurs. Abdomen: The abdomen is enlarged. Bowel sounds are normal. The abdomen is soft. There is no obviously palpable hepatomegaly, splenomegaly, or other masses. She is tender in the RLQ.  Arms: Muscle mass appears appropriate for age.  Hands: There is no obvious tremor. Phalangeal and metacarpophalangeal joints appear normal. Palms are normal. Legs: Muscle mass appears appropriate for age. There is no edema.  Feet: The right foot and ankle are bandaged and braced. Her left foot is normal, has 2+ DP pulse and is sensitive to touch in the ball and heel.  Neurologic: Muscle strength is normal for age and gender  in both the upper and the lower extremities. Muscle tone appears normal. Sensation to touch is normal in the legs. Skin: She has an obvious bite of her right anterior abdomen. She has been scratching it. She has an area of 50 mm wide by 30 mm vertically of varying degrees of redness and irritation. The area does not appear to be infected.    LABS:   Labs 02/01/21: HbA1c 5.5%, CBG 86  Labs 01/12/21: TSH 0.393, free T4 1.3, free T3 3.1; cholesterol 218,  triglycerides 89, HDL 77, LDL 125   Labs 10/27/20: HbA1c 5.4%, CBG 114  Labs 09/21/20: TSH 6.31, T4 7.5 (ef 4.5-12), CMP normal, except AST 44 (ref 0-40) and ALT 81 (ref 0-31); cholesterol 273, triglycerides 103, HDL 84, LDL 172; PTH 56 (ref 15-65), calcium 9.8, 25-OH  vitamin D 36.1; CBC normal;   Labs 06/17/20: HbA1c 5.7%,  CBG 89  Labs 01/05/20: HbA1c 5.8%; TSH 2.28, free T4 1.3, free T3 3.2; CMP normal, except ALT 54 (ref 6-29); PTH 22 (ref 14-64), calcium 10.2 (ref 8.6-10.4); cholesterol 242, triglycerides 79, HDL 78, LDL 146; 25-OH vitamin D 34  Labs 08/29/19; Urinalysis: 0-5 WBC, 3-10 RBC  Labs 07/30/19: Urinalysis: 0-5 WBC, 0-2 RBC  Labs 06/25/19: HbA1c 5.8%; TSH 3.50, free T4 1.2, free T3 2.7; cholesterol 255, triglycerides 123, HDL 86, LDL 144; PTH 35 (ref 14-64), calcium 9.9 (ref 8.6-10.4); 25-OH vitamin D 27 (ref 30-100)  Labs 05/13/18; PTH 35, calcium 9.7, 25-OH vitamin D 31  Labs 05/07/18: HbA1c 5.6%; TSH 1.02, free T4 1.58, free T3 2.7; CMP normal with calcium 9.5; CBC normal; cholesterol 239, triglycerides 110, HDL 75, LDL 142;   Labs 12/13/17: HbA1c 5.5%, CBG 91  Labs 12/10/17: TSH 1.23, free T4 1.39, free T3 2.5; CMP normal; cholesterol 254, triglycerides 96, HDL 71, LDL 163  Labs 08/08/17: HbA1c 5.4%, CBG 94  Labs 08/02/17: TSH 1.17, free T4 1.28, free T3 2.3; uric acid 6.4 (ref 2.5-7.0)  Labs 05/08/17: HbA1c 5.6%, CBG 88  Labs 04/19/17: TSH 0.453, free T4 1.61, free T3 2.7; Calcium 9.8, PTH 47; C-peptide 3.3  Labs 11/01/16: HbA1c 5.7%, CBG 84; TSH 1.23, free T4 1.5, free T3 2.7; PTH 54 (ref 14-64), calcium 9.1, 25-OH vitamin D 31  Labs 08/01/16: HbA1c 5.5%; TSH 7.10, free T4 1.0. Free T3 2.2; cholesterol 283, triglycerides 101, HDL 83, LDL 180  Labs 07/06/16: CBC normal, except RBC 3.96 (ref 4.04-5.48) and MCH 32.3 (ref 27-31.2), CMP normal  Labs 04/25/16: HbA1c 5.3%, CBG 105  Labs 01/21/16: HbA1c 5.8%; calcium 9.4, PTH 52, 25-OH vitamin D 35; TSH 0.01, free T4 1.8, free T3 3.8; cholesterol 239, triglycerides 65, HDL 75, LDL 151; CMP normal  Labs 07/21/15: TSH 5.01, free T4 1.45, TPO antibody <1, anti-thyroglobulin antibody <1; CMP normal, with calcium 10.0; CBC normal  Labs 04/27/15: HbA1c 5.5%; TSH 3.61, free T4 1.4, free T3 2.2; cholesterol 258, triglycerides 96, HDL 77, LDL 162; PTH 32,  calcium 10.0, 25-OH vitamin d 33  Labs 01/12/15: TSH 0.885, free T4 1.88, free T3 3.01; CBC: Normal except MCV 98; CMP normal; vitamin B12 (344 (normal [id number]), folic acid 0000000 (normal >5.4); PTH 80.49 (normal 12-72), calcium 9.10 (normal 8.5-10.6), 25-OH vitamin D 32.6; cholesterol 231, triglycerides 83, HDL 66, LDL 148  Labs 09/24/14: Ordered, but not performed  Labs 05/21/14: TSH 0.490, free T4 1.46, free T3 2.4; HbA1C 5.8%; calcium 9.3, PTH 52; 25-OH vitamin D 40; cholesterol 246, triglycerides 92, HDL 70, LDL 176; calcitonin <2  Labs 02/09/14: TSH 2.96, free T4 1.43, free T3 2.2  Labs 02/05/14: Urinalysis: large blood, 15 ketones; alkaline phosphatase 108 (normal 32-92);   Labs 02/02/14: Urinalysis: trace blood, no ketones; Hgb 13.6, Hct 43.9, MCV 100.3; CMP normal, with alkaline phosphatase 16 (normal 40-129); TSH 3.03; B12 305 (211-946); cholesterol 282, triglycerides 68, HDL 79, LDL 189; 25-hydroxy vitamin D >60  Labs 07/11/13: B12 236 (normal 211-911), calcitonin < 2 (normal < 6)  Labs 03/10/13: CMP normal. TSH 1.456, free T4 1.36, free T3 2.6; cholesterol 194, triglycerides 82, HDL 72, LDL 106  Labs 11/18/13: TSH 0.539, free T4 1.30, free T3 2.6  Labs 10/16/12: CMP normal; TSH 2.20, free T4 1.10, free T3 2.2; cholesterol 202, triglycerides 77, HDL 82, LDL 105  Labs 06/18/12: TSH 1.260, free T4 1.58, free T3 2.5, cholesterol  211, triglycerides 71, HDL 78, LDL 119  Labs 02/13/12: TSH 0.235, free T4 1.83, cholesterol 236, triglycerides 85, HDL 68, LDL 151. Had been back on Crestor for about 3 weeks, but was off it for about 7 days prior to the labs being drawn.   Labs 01/09/12: AST 45, ALT 70, cholesterol 298, triglycerides 85, HDL 72, LDL 209, TSH 0.44, free T4 1.52, 25-hydroxy,  vitamin D 41. Had been off Crestor for months.   Labs 09/06/11: TSH 0.663, free T4 1.65, T3 117, cholesterol 225, triglycerides 65, HDL 84, LDL 128  Labs 05/09/11: TSH 1.700. Free T4: 1.66. CMP is normal.  Cholesterol 214, triglycerides 67, HDL was 73, LDL 128. 25-OH-vitamin D 41.7. She started Zetia as of  05/15/11.  ASSESSMENT: 1. Hypothyroid, s/p I-131:   A. Her TFTs in August 2018 were normal on her Synthroid dose of 137 mcg/day for 6 days each week.    B. In February 2019 she was mildly hyperthyroid on that same dose. After changing the dose to 112 mcg/day, her TFTs on 08/02/17 and on 12/10/17 were mid-normal. Her TFTs were again mid-normal on 05/07/18. In April 2021, however, she needed more thyroid hormone.   C. Her TFTs in November 2021 were good on her current dose of thyroid hormone.  D. Her TFTs in July were too low. She needed more thyroid hormone.   E. Her TFTs in November 2022 were a bit high, but she was clinically euthyroid. We continued this current dose of levothyroxine in anticipation that she might lose more thyrocytes. She could have a recurrence of Graves' disease. 2. Goiter: Thyroid gland was again within normal limits for size today.   3. Thyroiditis: Hashimoto's disease is clinically quiescent again today. The non-physiologic shifts in her TFTs at times are c/w intermittent Hashimoto's Dz activity. 4. Prediabetes/Elevated HbA1c: Her HbA1c was up into the prediabetes zone in August 2018, but then decreased back into the normal range. Her HbA1c was at the upper end of the normal range on 05/07/18, but was back into the prediabetes range in April 2021. Her HbA1c in April 2022 was lower, but still in the prediabetes range. Her HbA1c in August 2022 was in the normal range. Her HbA1c was a bit higher in November 2022, paralleling her weight gain.  5. Obesity: Her weight has increased more. She needs to be careful with her diet.   6. Hypertension: Her BP is high today.   7. Hypercholesterolemia: The patient's total cholesterol and LDL in March 2020 were better than in October 2019, but still too high. Her cholesterol in November 2021 were lower, but still too high. Her cholesterols were  higher in July 2022 before she started ezetimibe. Her labs in November were improved. We discussed the possible use of injectable PCSK9 inhibitor medications at her last visit, but she was not in favor of that option. She needs to lose weight.   8. Dyspepsia/GERD: She was diagnosed with a gastric ulcer in October 2018. She did not have any ulcer symptoms. She was taking omeprazole. She feels that her reflux symptoms have improved since re-starting rabeprazole.  10. Tobacco: She stopped smoking during her 10-day hospitalization in December 2020 and has remained "smoke free" since then.   10. Edema: She had no edema at her last visit or at today's visit.  11. 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 When she was on the diet her diarrhea stopped. When she went off the diet her diarrhea worsened. Her calcitonin  was negative, ruling out hypercalcitoninemia as a cause of her diarrhea. She continues to be followed by GI. No obvious lesions were seen during her colonoscopy in October 2019. She had to cancel her planned colonoscopy in December 2020 due to her appendicitis. She has not had any diarrhea recently.  12. Excess vitamin D: Her vitamin D levels in 2016, in 2017, and again in 2018 were normal. Her vitamin D level in March 2020 was low-normal. Her vitamin D level in April 2021 was mildly low. Her vitamin D level in July 2022 was normal.   13. S/p stroke: Her MRI on 04/16/08 showed CNS defects in the right cerebrum. Her neurologic defects, however, were on the right side of her body. She may have had more CNS damage in her left cerebrum than had been recognized.  She seems to have recovered fairly well, but still has some right hemiparetic findings. She is doing very well emotionally and mentally.  14. Hyperparathyroidism, secondary to inadequate calcium intake: Her PTH and calcium values in August 2018 were normal, but the calcium was at about the 25% of the normal range. Her calcium and PTH  values in February 2019 were good. Her calcium and PTH in March 2020, in April 2021, in November 2021, and in July 2022 were normal.  15. Snoring: I suspect that she has OSA. She refuses to have a sleep study done.  16. Nasal congestion and associated symptoms: Her allergies have been acting up more this Summer, but Zyrtec helps.   17. Arthritis: I suspect that she has osteoarthritis. Her uric acid level was normal in May 2019.  18. Plantar fasciitis: Some of her foot pain in the past has been due to planter fasciitis. I suggested that she purchase some good insoles with arch support. I also suggested that she see a podiatrist.    19. Supraclavicular fullness: The prominence in the supraclavicular fossae has improved over time and was not evident at her last clinic visit. It appears that the prominence was just fatty tissue. 20. Tick bite: She found an imbedded tick on 07/18/21. She as been treated with doxycycline in the past for tick bites. As long as she took the pills with food she did fine. She is not allergic to doxycycline or tetracycline.   PLAN: 1. Diagnostic: I ordered HbA1c, TFTs, C-peptide, and lipid panel to be done so on.     2. Therapeutic: Continue ezetimibe, 10 mg/day. Continue rabeprazole, 20 mg/day. Increase  Synthroid dose to 125 mcg/day. Continue calcium dose of 900 mg/day. Continue the vitamin D dose of 3,000 IU/day. Continue the Centrum Silver.  Take Tums as above. Start doxycycline, 200 mg/day for 10 days. 3. Patient education: We discussed her GI problems, her thyroiditis and hypothyroidism, her cholesterols, her neuro issues, her edema, and her foot problems. 4. Follow-up: Follow up visit in 4 months with me if I am still in the practice.   Level of Service: This visit lasted in excess of 70 minutes. More than 50% of the visit was devoted to counseling.  Tillman Sers, MD, CDE Adult and Pediatric Endocrinology

## 2021-07-21 ENCOUNTER — Ambulatory Visit (INDEPENDENT_AMBULATORY_CARE_PROVIDER_SITE_OTHER): Payer: Medicare PPO | Admitting: "Endocrinology

## 2021-07-21 ENCOUNTER — Encounter (INDEPENDENT_AMBULATORY_CARE_PROVIDER_SITE_OTHER): Payer: Self-pay | Admitting: "Endocrinology

## 2021-07-21 VITALS — BP 124/88 | HR 72 | Wt 227.8 lb

## 2021-07-21 DIAGNOSIS — E890 Postprocedural hypothyroidism: Secondary | ICD-10-CM

## 2021-07-21 DIAGNOSIS — E049 Nontoxic goiter, unspecified: Secondary | ICD-10-CM

## 2021-07-21 DIAGNOSIS — I10 Essential (primary) hypertension: Secondary | ICD-10-CM | POA: Diagnosis not present

## 2021-07-21 DIAGNOSIS — E063 Autoimmune thyroiditis: Secondary | ICD-10-CM

## 2021-07-21 DIAGNOSIS — G8191 Hemiplegia, unspecified affecting right dominant side: Secondary | ICD-10-CM

## 2021-07-21 DIAGNOSIS — R7303 Prediabetes: Secondary | ICD-10-CM | POA: Diagnosis not present

## 2021-07-21 DIAGNOSIS — K219 Gastro-esophageal reflux disease without esophagitis: Secondary | ICD-10-CM

## 2021-07-21 DIAGNOSIS — R1013 Epigastric pain: Secondary | ICD-10-CM

## 2021-07-21 DIAGNOSIS — E782 Mixed hyperlipidemia: Secondary | ICD-10-CM

## 2021-07-21 DIAGNOSIS — F17200 Nicotine dependence, unspecified, uncomplicated: Secondary | ICD-10-CM

## 2021-07-21 DIAGNOSIS — R5383 Other fatigue: Secondary | ICD-10-CM

## 2021-07-21 MED ORDER — DOXYCYCLINE HYCLATE 200 MG PO TBEC: DELAYED_RELEASE_TABLET | ORAL | 1 refills | Status: AC

## 2021-07-21 NOTE — Patient Instructions (Signed)
Follow up visit in 4 months. Please repeat fasting lab tests soon.  At [facility], we are committed to providing exceptional care. You will receive a patient satisfaction survey through text or email regarding your visit today. Your opinion is important to me. Comments are appreciated.

## 2021-07-25 ENCOUNTER — Telehealth (INDEPENDENT_AMBULATORY_CARE_PROVIDER_SITE_OTHER): Payer: Self-pay

## 2021-07-25 NOTE — Telephone Encounter (Signed)
Spoke with Dr Tobe Sos, he stated that since insurance didn't want to cover the Doxycycline, an acceptable alternative would be Amoxicillin 500 mg 3 times a day for 7 days.  Called pt and she picked up, I told her was I noted previously, she stated that she already had the doxycycline in hand, but was 100 mg tabs and she is taking 2 a day. She stated that 200 mg at once was upsetting her stomach so she is splitting them up and eating something with them and doesn't seem to be having any difficulty anymore.  I told her if she had any issues to please give [place] a call.

## 2021-07-25 NOTE — Telephone Encounter (Signed)
Tried calling pt to let her know I am unable to reach Dr Fransico Michael to confirm medication change from doxycycline Hyclate to Minocycline per insurance coverage. Will try sending her a MyChart message.

## 2021-07-26 ENCOUNTER — Other Ambulatory Visit (INDEPENDENT_AMBULATORY_CARE_PROVIDER_SITE_OTHER): Payer: Self-pay | Admitting: "Endocrinology

## 2021-08-04 LAB — LIPID PANEL W/O CHOL/HDL RATIO
Cholesterol, Total: 237 mg/dL — ABNORMAL HIGH (ref 100–199)
HDL: 77 mg/dL (ref >39)
LDL Chol Calc (NIH): 149 mg/dL — ABNORMAL HIGH (ref 0–99)
Triglycerides: 68 mg/dL (ref 0–149)
VLDL Cholesterol Cal: 11 mg/dL (ref 5–40)

## 2021-08-04 LAB — HGB A1C W/O EAG: Hgb A1c MFr Bld: 6.1 % — ABNORMAL HIGH (ref 4.8–5.6)

## 2021-08-04 LAB — TSH+T3+FREE T4+T3 FREE
Free T-3: 3.4 pg/mL
Free T4 by Dialysis: 2.3 ng/dL — ABNORMAL HIGH
TSH: 0.4 uU/mL
Triiodothyronine (T-3), Serum: 118 ng/dL

## 2021-08-04 LAB — C-PEPTIDE: C-Peptide: 5.6 ng/mL — ABNORMAL HIGH (ref 1.1–4.4)

## 2021-08-04 LAB — SPECIMEN STATUS REPORT

## 2021-09-24 ENCOUNTER — Other Ambulatory Visit (INDEPENDENT_AMBULATORY_CARE_PROVIDER_SITE_OTHER): Payer: Self-pay | Admitting: "Endocrinology

## 2021-10-06 ENCOUNTER — Encounter (INDEPENDENT_AMBULATORY_CARE_PROVIDER_SITE_OTHER): Payer: Self-pay

## 2021-10-24 ENCOUNTER — Other Ambulatory Visit (INDEPENDENT_AMBULATORY_CARE_PROVIDER_SITE_OTHER): Payer: Self-pay | Admitting: "Endocrinology

## 2021-11-22 NOTE — Progress Notes (Signed)
CC: [name] presents at this clinic visit today for follow-up hypothyroid s/p I-131 treatment, hypertension, goiter, dyspepsia, fatigue, GERD, Hashimoto's disease, status post CVA, partial right hemiparesis, hyperlipidemia, smoking, prediabetes, and supraclavicular fullness.   History of present illness: [name] is a 61 year old Caucasian woman.   1. Ms [name] [name] first presented to me on 08/10/04 in referral from her primary care provider, Dr. Lonzo Cloud in [place], [place], for evaluation and management of hyperthyroidism. In retrospect, the patient had chronic problems with infected sinuses, inner ear symptoms, and spinning dizziness. She also had complaints of severe fatigue and weight gain. Dr. Raynelle Jan had performed thyroid tests on 06/14/04. TSH was 0.13, free T4 1.04. Thyroid ultrasound showed small and large nodules in a thyroid gland that was somewhat enlarged overall. On examination she had a 20+ gram thyroid gland. Thyroid gland was firm, but nontender. She also had some diastolic hypertension. Thyroid tests showed a TSH of 0.437, free T4 1.01, and free T3 of 2.6. Her TPO antibody level was 29 9. Her TSI level was 98. It appeared at that time that the patient likely had some elevation of thyroid hormone due to Hashimoto's disease or had a combination of intermittently active Hashimoto's Disease and intermittently active Graves' Disease.   2. During the next 2 years, her TSH values varied from 0.350-0.854. By February of 2008, however, her TSH decreased to 0.008, her free T4 increased to 2.08, and her free T3 increased to 5.8. She was clearly hyperthyroid and thyrotoxic. TSI level at that point was 1.8 with normal being 1.3 or less. On 05/25/06 she underwent radioactive iodine treatment with I-131. She subsequently became hypothyroid and was started on Synthroid replacement therapy. Since then her doses of Synthroid have been adjusted many times.   3. During the past 15 years the patient has had  several other medical problems:  A. She has had one or more CVAs/TIAs, which have resulted in some mild right hemiparesis and some transient memory losses. The MRI of her brain in February 2010 showed evidence for remote infarcts involving the posterior right temporal lobe and parietal lobe.    B. She also had a 3-4 month episode of spontaneous galactorrhea in 2010. Brain MRI showed a 2 x 3 mm area of delayed enhancement seen posteriorly on the left side of her pituitary gland. I felt at the time that it would be very uncharacteristic for a true prolactinoma this small to actually produce hyperprolactinemia and clinical galactorrhea. The galactorrhea subsequently resolved spontaneously.   C. On 08/02/2010 she presented with increased prominence of both supraclavicular areas. A CT scan was performed which showed no significant pathology. She was evaluated by Dr. Melida Quitter of [facility]. Dr. Redmond Baseman could not identify any cause of the supraclavicular area prominence other than obesity. Since then, that prominence has waxed and waned with her changes in weight.    D. The patient was started on Crestor for hyperlipidemia, but initially could not tolerate even the 5 mg dose more than twice per week. She was subsequently started on Zetia, 10 mg/day, which she tolerated better, but did not control her lipids as well. Crestor was later re-started at a lower dose and she was able to tolerate that medication. However, she later discontinued the Crestor due to symptoms.   E. She has had multiple GI complaints over time, to include severe, chronic diarrhea, heartburn, and abdominal pains. In the Summer of 2015 she saw a gastroenterologist who performed a colonoscopy and removed a polyp.  He also put her on a gluten-free and casein-free diet. Her symptoms improved on that diet, but she has not been consistent with that diet for quite some time.    F. The patient has been a smoker for many years. She successfully  stopped smoking by using Chantix several years ago, but later resumed smoking. She eventually stopped smoking in December 2020.   G. She had ingrown toenail surgery in late September and again in November 2020.  H. On 02/12/19 she developed acute appendicitis. She had an appendectomy at [facility] in [place] (the [facility] She was hospitalized for 10 days. She also developed pleurisy and shortness of breath. She is being followed by a doctor in [place], [place]. She stopped smoking.   I. Her abdominal CT scan showed that her kidney stone was larger prior to her March 2021 visit. She was being followed by a urologist in [place].   J. On 10/25/19 she had a urethroscopic removal of an 8 mm calculus in the let distal ureter. The stone analysis showed CaOx/Mono/CA OxDi/hydroxyapatite. She developed a UTI a few days later. At her urology follow up exam on 06/16/20 she was doing well.  K. She had a mild covid infection in May 2022.  L. In October 2022 she fractured her right ankle when she stepped in a hole.   4. The patient's last endocrine clinic visit was on 07/18/21. I continued her ezetimibe, 10 mg/day. I increased her Synthroid dose to 125 mcg/day. I continued her calcium dose of 900 mg/day, her vitamin D dose of 3,000 IU/day, her Centrum Silver once a day, and rabeprazole, 20 mg, daily.  I started her on doxycycline for 10 days for a recent tick bite. I ordered lab tests to be done in 8 weeks, but they were not done.  A. In the interim, she has recovered from her tick bite on her abdomen on Monday, 07/18/21.   B. She has not had any additional falls. She still has her back pains, neck pains, and right sciatica.   C. She is still fairly tired and sleepy. She is not having much hair loss.   D.  She had not had any recurrence of kidney stones. Her KUB did not show any calcifications.   E. She has not had any tenderness in her RLQ and hypogastrium following her appendectomy in 2020.   F. She  refuses to get her covid vaccine. She does not trust Dr. Brenton Grills or Flossie Dibble. She was socially distancing and masking.   G. She still has episodic stabbing pains in her legs and feet, sometimes at night, sometimes during the day. These pains sometimes limit her willingness and ability to walk. Her knees have not been too bad. She has had some ankle edema at times. Her dogs are keeping her more active. She is also swimming.     H. She takes Tums for her heartburn. She resumed taking rabeprazole after having had more acid reflux. The medication is helping her. She has stopped drinking sodas. She is not craving sweets as much. She is eating at home more and eating out less.    I. She is not having much intermittent diarrhea. She is trying to follow the gluten-free diet more consistently. If she eats out more, she as more diarrhea.   J. She fairly consistent has joint and soft tissue aches and pains. She has been having her usual low back pain  K. She has not had any further stroke symptoms.  Her neurologist continued her on Plavix, 75 mg/day.    L. She take Tums, Centrum Silver, calcium 900 mg, and 3,000 IU of vitamin D once daily. She also takes lutein, zinc, and vitamin C  M. She says she is having a sleep apnea evaluation.   N. Her allergies have been acting up a lot recently.  She has two dogs.       O. She is doing "fine" emotionally.   P. She is also doing well cognitively. She again responded with her classic answer, "I'm fine. I'm not eating checkers yet."    5. Pertinent Review of Systems: Constitutional: She says, "I'm feeling fine, except for the aches and pains." Her stamina is better, but she still gets short of breath. She still snores "like a freight train" and still frequently wakes up choking. Eyes: Vision is good with her glasses. She had an eye exam in September 2023 and received new glasses.   Neck:  She has no complaints of anterior neck swelling, soreness, tenderness, pressure, or  discomfort. She has not had any recent  trapezius spasm and pinching of the cervical nerves.   Heart: She has not had any problems with fast heart rate, chest pain, or chest pressure. 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 Her cardiologist confirmed that diagnosis at her follow up appointment in September or October 2016. The patient has no other complaints of irregular heat beats, chest pain, or chest pressure. She has dyspnea on exertion and sometimes at rest.  Gastrointestinal: Her heartburn and GERD are variable depending upon what she eats and how much she eats. Her GERD and heartburn are better when she avoids spicy foods, like ketchup and when she takes rabeprazole.  Unfortunately she likes spicy foods and "I love ketchup."    She continues to have intermittent problems with swallowing. 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 She still has frequent, intermittent diarrhea.  Hands and arms: Her arthritis in her hands persists. : Legs: As above. She still occasionally has the sensation of something crawling on her skin in the upper right thigh and discomfort of her right patella. Muscle mass and strength seem unchanged. Her right side remains somewhat weaker than her left. She still notes some burning of her calves at times. She occasionally has pedal edema, but less now.  Feet: As above. She sometimes has numbness, burning, and "tingling" in her feet. She notes some edema at times. Her right leg has become stronger over time, so she only rarely drags her right foot anymore.  Neuro: She has persistent bilateral numbness in her thumbs and first two fingers bilaterally.  Mental/psychological: Her abilities to think, to pay attention, to remember, and to make decisions are good. She again smiled and said, "I ain't eating checkers." Skin: She has several moles. Her dermatologist told her that they were nothing to  worry about.   REVIEW OF SYSTEMS: There are no other significant problems involving her other body systems.  PAST MEDICAL, FAMILY, AND SOCIAL HISTORY:  1. Family: She still lives alone, except for her dog. She has frequent social interactions with friends and family. She has two sisters with hypothyroidism. Her daughter is also having thyroid problems. 2. Activities: She has been less physically active recently.     3. Tobacco: She stopped smoking in December 2020.   4. Primary care provider: Dr. Salli Real  in [place], phone [phone] 5. Neurologist: Dr. Wendi Snipes, [facility], phone [phone] 6. Cardiologist: Dr. Daiva Huge in [place]. She has not yet found a new cardiologist in [place].   PHYSICAL EXAM:  BP 122/70   Pulse 65   Wt 222 lb (100.7 kg)   BMI 41.95 kg/m   Growth %ile SmartLinks can only be used for patients less than 41 years old.  Wt Readings from Last 3 Encounters:  11/23/21 222 lb (100.7 kg)  07/21/21 227 lb 12.8 oz (103.3 kg)  02/01/21 225 lb 3.2 oz (102.2 kg)    Ht Readings from Last 3 Encounters:  06/16/20 5\' 1"  (1.549 m)  12/10/19 5\' 1"  (1.549 m)  10/29/19 5\' 1"  (1.549 m)    HC Readings from Last 3 Encounters:  No data found for [facility]   Facility age limit for growth %iles is 20 years.  Body mass index is 41.95 kg/m. Facility age limit for growth %iles is 20 years.  Body surface area is 2.08 meters squared.   Constitutional: The patient looks healthy and appears physically and emotionally well. She has lost 5 pounds in 4 months. She is alert, bright, affable, perky, very sharp, and has a great sense of humor. It is always a pleasure to see her. She loves to talk. Her BP is normal today.  Eyes: There is no arcus or proptosis.  Mouth: The oropharynx appears normal. The tongue appears normal. There is normal oral moisture. There is no obvious gingivitis. Neck: There are no bruits present. The thyroid gland appears normal in size. The  thyroid gland is normal at approximately 18 grams in size. The consistency of the thyroid gland is normal. There is no thyroid tenderness to palpation. Lungs: The lungs are clear. Air movement is good. Heart: The heart rhythm and rate appear normal. Heart sounds S1 and S2 are normal. I do not appreciate any pathologic heart murmurs. Abdomen: The abdomen is quite enlarged. Bowel sounds are normal. The abdomen is soft. There is no obviously palpable hepatomegaly, splenomegaly, or other masses. She is not tender in the RLQ.  Arms: Muscle mass appears appropriate for age.  Hands: There is no obvious tremor. Phalangeal and metacarpophalangeal joints appear normal. Palms are normal. Legs: Muscle mass appears appropriate for age. There is no edema.  Neurologic: Muscle strength is normal for age and gender  in both the upper and the lower extremities. Muscle tone appears normal. Sensation to touch is normal in the legs. Skin:No lesions   LABS:   Labs 11/23/21: HbA1c 6.2%, CBG 68  Labs 07/26/21: HbA1c 6.1%; TSH 0.40, free T4 2.3, free T3 3.4; cholesterol 237, triglycerides 68, HDL 77, LDL 149; C-peptide 5.6 (ref 1.1-4.4)  Labs 02/01/21: HbA1c 5.5%, CBG 86  Labs 01/12/21: TSH 0.393, free T4 1.3, free T3 3.1; cholesterol 218,  triglycerides 89, HDL 77, LDL 125   Labs 10/27/20: HbA1c 5.4%, CBG 114  Labs 09/21/20: TSH 6.31, T4 7.5 (ef 4.5-12), CMP normal, except AST 44 (ref 0-40) and ALT 81 (ref 0-31); cholesterol 273, triglycerides 103, HDL 84, LDL 172; PTH 56 (ref 15-65), calcium 9.8, 25-OH vitamin D 36.1; CBC normal;   Labs 06/17/20: HbA1c 5.7%, CBG 89  Labs 01/05/20: HbA1c 5.8%; TSH 2.28, free T4 1.3, free T3 3.2; CMP normal, except ALT 54 (ref 6-29); PTH 22 (ref 14-64), calcium 10.2 (ref 8.6-10.4); cholesterol 242, triglycerides 79, HDL 78, LDL 146; 25-OH vitamin D 34  Labs 08/29/19; Urinalysis: 0-5 WBC, 3-10 RBC  Labs 07/30/19: Urinalysis: 0-5  WBC, 0-2 RBC  Labs 06/25/19: HbA1c 5.8%; TSH 3.50, free  T4 1.2, free T3 2.7; cholesterol 255, triglycerides 123, HDL 86, LDL 144; PTH 35 (ref 14-64), calcium 9.9 (ref 8.6-10.4); 25-OH vitamin D 27 (ref 30-100)  Labs 05/13/18; PTH 35, calcium 9.7, 25-OH vitamin D 31  Labs 05/07/18: HbA1c 5.6%; TSH 1.02, free T4 1.58, free T3 2.7; CMP normal with calcium 9.5; CBC normal; cholesterol 239, triglycerides 110, HDL 75, LDL 142;   Labs 12/13/17: HbA1c 5.5%, CBG 91  Labs 12/10/17: TSH 1.23, free T4 1.39, free T3 2.5; CMP normal; cholesterol 254, triglycerides 96, HDL 71, LDL 163  Labs 08/08/17: HbA1c 5.4%, CBG 94  Labs 08/02/17: TSH 1.17, free T4 1.28, free T3 2.3; uric acid 6.4 (ref 2.5-7.0)  Labs 05/08/17: HbA1c 5.6%, CBG 88  Labs 04/19/17: TSH 0.453, free T4 1.61, free T3 2.7; Calcium 9.8, PTH 47; C-peptide 3.3  Labs 11/01/16: HbA1c 5.7%, CBG 84; TSH 1.23, free T4 1.5, free T3 2.7; PTH 54 (ref 14-64), calcium 9.1, 25-OH vitamin D 31  Labs 08/01/16: HbA1c 5.5%; TSH 7.10, free T4 1.0. Free T3 2.2; cholesterol 283, triglycerides 101, HDL 83, LDL 180  Labs 07/06/16: CBC normal, except RBC 3.96 (ref 4.04-5.48) and MCH 32.3 (ref 27-31.2), CMP normal  Labs 04/25/16: HbA1c 5.3%, CBG 105  Labs 01/21/16: HbA1c 5.8%; calcium 9.4, PTH 52, 25-OH vitamin D 35; TSH 0.01, free T4 1.8, free T3 3.8; cholesterol 239, triglycerides 65, HDL 75, LDL 151; CMP normal  Labs 07/21/15: TSH 5.01, free T4 1.45, TPO antibody <1, anti-thyroglobulin antibody <1; CMP normal, with calcium 10.0; CBC normal  Labs 04/27/15: HbA1c 5.5%; TSH 3.61, free T4 1.4, free T3 2.2; cholesterol 258, triglycerides 96, HDL 77, LDL 162; PTH 32, calcium 10.0, 25-OH vitamin d 33  Labs 01/12/15: TSH 0.885, free T4 1.88, free T3 3.01; CBC: Normal except MCV 98; CMP normal; vitamin B12 (344 (normal [id number]), folic acid 0000000 (normal >5.4); PTH 80.49 (normal 12-72), calcium 9.10 (normal 8.5-10.6), 25-OH vitamin D 32.6; cholesterol 231, triglycerides 83, HDL 66, LDL 148  Labs 09/24/14: Ordered, but not  performed  Labs 05/21/14: TSH 0.490, free T4 1.46, free T3 2.4; HbA1C 5.8%; calcium 9.3, PTH 52; 25-OH vitamin D 40; cholesterol 246, triglycerides 92, HDL 70, LDL 176; calcitonin <2  Labs 02/09/14: TSH 2.96, free T4 1.43, free T3 2.2  Labs 02/05/14: Urinalysis: large blood, 15 ketones; alkaline phosphatase 108 (normal 32-92);   Labs 02/02/14: Urinalysis: trace blood, no ketones; Hgb 13.6, Hct 43.9, MCV 100.3; CMP normal, with alkaline phosphatase 16 (normal 40-129); TSH 3.03; B12 305 (211-946); cholesterol 282, triglycerides 68, HDL 79, LDL 189; 25-hydroxy vitamin D >60  Labs 07/11/13: B12 236 (normal 211-911), calcitonin < 2 (normal < 6)  Labs 03/10/13: CMP normal. TSH 1.456, free T4 1.36, free T3 2.6; cholesterol 194, triglycerides 82, HDL 72, LDL 106  Labs 11/18/13: TSH 0.539, free T4 1.30, free T3 2.6  Labs 10/16/12: CMP normal; TSH 2.20, free T4 1.10, free T3 2.2; cholesterol 202, triglycerides 77, HDL 82, LDL 105  Labs 06/18/12: TSH 1.260, free T4 1.58, free T3 2.5, cholesterol 211, triglycerides 71, HDL 78, LDL 119  Labs 02/13/12: TSH 0.235, free T4 1.83, cholesterol 236, triglycerides 85, HDL 68, LDL 151. Had been back on Crestor for about 3 weeks, but was off it for about 7 days prior to the labs being drawn.   Labs 01/09/12: AST 45, ALT 70, cholesterol 298, triglycerides 85, HDL 72, LDL 209, TSH 0.44, free T4  1.52, 25-hydroxy,  vitamin D 41. Had been off Crestor for months.   Labs 09/06/11: TSH 0.663, free T4 1.65, T3 117, cholesterol 225, triglycerides 65, HDL 84, LDL 128  Labs 05/09/11: TSH 1.700. Free T4: 1.66. CMP is normal. Cholesterol 214, triglycerides 67, HDL was 73, LDL 128. 25-OH-vitamin D 41.7. She started Zetia as of  05/15/11.  ASSESSMENT: 1. Hypothyroid, s/p I-131:   A. Her TFTs in August 2018 were normal on her Synthroid dose of 137 mcg/day for 6 days each week.    B. In February 2019 she was mildly hyperthyroid on that same dose. After changing the dose to 112 mcg/day,  her TFTs on 08/02/17 and on 12/10/17 were mid-normal. Her TFTs were again mid-normal on 05/07/18. In April 2021, however, she needed more thyroid hormone.   C. Her TFTs in November 2021 were good on her current dose of thyroid hormone.  D. Her TFTs in July were too low. She needed more thyroid hormone.   E. Her TFTs in November 2022 were a bit high, but she was clinically euthyroid. Her TFTs on July 2023 were abnormal, in that all three of the tests increased together in parallel, c/w a recent episode of thyroiditis.  2. Goiter: Thyroid gland was again within normal limits for size today.   3. Thyroiditis: Hashimoto's disease is clinically quiescent again today. The non-physiologic shifts in her TFTs at times are c/w intermittent Hashimoto's Dz activity. 4. Prediabetes/Elevated HbA1c: Her HbA1c was up into the prediabetes zone in August 2018, but then decreased back into the normal range. Her HbA1c was at the upper end of the normal range on 05/07/18, but was back into the prediabetes range in April 2021. Her HbA1c in April 2022 was lower, but still in the prediabetes range. Her HbA1c in August 2022 was in the normal range. Her HbA1c was a bit higher in November 2022, paralleling her weight gain. Her HbA1c has increased to 6.2% since then.  5. Obesity: Her weight has decreased. She needs to be careful with her diet.   6. Hypertension: Her BP is normal today.   7. Hypercholesterolemia: The patient's total cholesterol and LDL in March 2020 were better than in October 2019, but still too high. Her cholesterol in November 2021 were lower, but still too high. Her cholesterols were higher in July 2022 before she started ezetimibe. Her labs in November 2022 were improved, but were worse in March 2023.  We discussed the possible use of injectable PCSK9 inhibitor medications at her last visit, but she was not in favor of that option. She needs to lose more weight.   8. Dyspepsia/GERD: She was diagnosed with a gastric  ulcer in October 2018. She did not have any ulcer symptoms. She feels that her reflux symptoms have improved since re-starting rabeprazole.  10. Tobacco: She stopped smoking during her 10-day hospitalization in December 2020 and has remained "smoke free" since then.   10. Edema: She had no edema at her last two visits or at today's visit.  11. 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 When she was on the diet her diarrhea stopped. When she went off the diet her diarrhea worsened. Her calcitonin was negative, ruling out hypercalcitoninemia as a cause of her diarrhea. She continues to be followed by GI. No obvious lesions were seen during her colonoscopy in October 2019. She had to cancel her planned colonoscopy in December 2020 due to her appendicitis. She has not had much diarrhea recently.  12. Excess vitamin D: Her vitamin D levels in 2016, in 2017, and again in 2018 were normal. Her vitamin D level in March 2020 was low-normal. Her vitamin D level in April 2021 was mildly low. Her vitamin D level in July 2022 was normal.   13. S/p stroke: Her MRI on 04/16/08 showed CNS defects in the right cerebrum. Her neurologic defects, however, were on the right side of her body. She may have had more CNS damage in her left cerebrum than had been recognized.  She seems to have recovered fairly well, but still has some right hemiparetic findings. She is doing very well emotionally and mentally.  14. Hyperparathyroidism, secondary to inadequate calcium intake: Her PTH and calcium values in August 2018 were normal, but the calcium was at about the 25% of the normal range. Her calcium and PTH values in February 2019 were good. Her calcium and PTH were normal in March 2020, in April 2021, in November 2021, and in July 2022.  15. Snoring: I suspect that she has OSA. She is now undergoing a sleep study evaluation.   16. Nasal congestion and associated symptoms: Her allergies have been acting up more this  Summer, but Zyrtec helps.   17. Arthritis: I suspect that she has osteoarthritis. Her uric acid level was normal in May 2019.  18. Plantar fasciitis: Some of her foot pain in the past has been due to planter fasciitis. I suggested that she purchase some good insoles with arch support. I also suggested that she see a podiatrist.    19. Supraclavicular fullness: The prominence in the supraclavicular fossae has improved over time and was not evident at her last clinic visit. It appears that the prominence was just fatty tissue.  PLAN: 1. Diagnostic: I ordered HbA1c, TFTs, CMP, PTH, calcium, vitamin D, and lipid panel to be done today.  2. Therapeutic: Continue ezetimibe, 10 mg/day. Continue rabeprazole, 20 mg/day. Increase  Synthroid dose to 125 mcg/day. Continue calcium dose of 900 mg/day. Continue the vitamin D dose of 3,000 IU/day. Continue the Centrum Silver.  Take Tums as above.  3. Patient education: We discussed her GI problems, her thyroiditis and hypothyroidism, her cholesterols, her neuro issues, her edema, and her foot problems. 4. Follow-up: She will check to see if she can have endocrine follow up in [place] or wants to come back o [place].   Level of Service: This visit lasted in excess of 65 minutes. More than 50% of the visit was devoted to counseling.  Tillman Sers, MD, CDE Adult and Pediatric Endocrinology

## 2021-11-23 ENCOUNTER — Ambulatory Visit (INDEPENDENT_AMBULATORY_CARE_PROVIDER_SITE_OTHER): Payer: Medicare PPO | Admitting: "Endocrinology

## 2021-11-23 ENCOUNTER — Encounter (INDEPENDENT_AMBULATORY_CARE_PROVIDER_SITE_OTHER): Payer: Self-pay | Admitting: "Endocrinology

## 2021-11-23 VITALS — BP 122/70 | HR 65 | Wt 222.0 lb

## 2021-11-23 DIAGNOSIS — E890 Postprocedural hypothyroidism: Secondary | ICD-10-CM | POA: Diagnosis not present

## 2021-11-23 DIAGNOSIS — E8881 Metabolic syndrome: Secondary | ICD-10-CM | POA: Diagnosis not present

## 2021-11-23 DIAGNOSIS — E782 Mixed hyperlipidemia: Secondary | ICD-10-CM

## 2021-11-23 DIAGNOSIS — E559 Vitamin D deficiency, unspecified: Secondary | ICD-10-CM

## 2021-11-23 DIAGNOSIS — E063 Autoimmune thyroiditis: Secondary | ICD-10-CM | POA: Diagnosis not present

## 2021-11-23 DIAGNOSIS — R7303 Prediabetes: Secondary | ICD-10-CM | POA: Diagnosis not present

## 2021-11-23 DIAGNOSIS — R7401 Elevation of levels of liver transaminase levels: Secondary | ICD-10-CM

## 2021-11-23 DIAGNOSIS — R600 Localized edema: Secondary | ICD-10-CM

## 2021-11-23 DIAGNOSIS — E211 Secondary hyperparathyroidism, not elsewhere classified: Secondary | ICD-10-CM

## 2021-11-23 DIAGNOSIS — I10 Essential (primary) hypertension: Secondary | ICD-10-CM

## 2021-11-23 LAB — POCT GLYCOSYLATED HEMOGLOBIN (HGB A1C): Hemoglobin A1C: 6.2 % — AB (ref 4.0–5.6)

## 2021-11-23 LAB — POCT GLUCOSE (DEVICE FOR HOME USE): POC Glucose: 68 mg/dl — AB (ref 70–99)

## 2021-11-23 NOTE — Patient Instructions (Signed)
No further follow up here.  At [facility], we are committed to providing exceptional care. You will receive a patient satisfaction survey through text or email regarding your visit today. Your opinion is important to me. Comments are appreciated.  

## 2021-11-24 LAB — T4, FREE: Free T4: 1.5 ng/dL (ref 0.8–1.8)

## 2021-11-24 LAB — LIPID PANEL
Cholesterol: 244 mg/dL — ABNORMAL HIGH (ref <200)
HDL: 76 mg/dL (ref >=50)
LDL Cholesterol (Calc): 144 mg/dL (calc) — ABNORMAL HIGH
Non-HDL Cholesterol (Calc): 168 mg/dL (calc) — ABNORMAL HIGH (ref <130)
Total CHOL/HDL Ratio: 3.2 (calc) (ref <5.0)
Triglycerides: 121 mg/dL (ref <150)

## 2021-11-24 LAB — COMPREHENSIVE METABOLIC PANEL
AG Ratio: 1.7 (calc) (ref 1.0–2.5)
ALT: 75 U/L — ABNORMAL HIGH (ref 6–29)
AST: 50 U/L — ABNORMAL HIGH (ref 10–35)
Albumin: 4.5 g/dL (ref 3.6–5.1)
Alkaline phosphatase (APISO): 111 U/L (ref 37–153)
BUN: 16 mg/dL (ref 7–25)
CO2: 28 mmol/L (ref 20–32)
Calcium: 9.7 mg/dL (ref 8.6–10.4)
Chloride: 101 mmol/L (ref 98–110)
Creat: 0.87 mg/dL (ref 0.50–1.05)
Globulin: 2.6 g/dL (calc) (ref 1.9–3.7)
Glucose, Bld: 103 mg/dL (ref 65–139)
Potassium: 4.4 mmol/L (ref 3.5–5.3)
Sodium: 140 mmol/L (ref 135–146)
Total Bilirubin: 0.4 mg/dL (ref 0.2–1.2)
Total Protein: 7.1 g/dL (ref 6.1–8.1)

## 2021-11-24 LAB — PTH, INTACT AND CALCIUM
Calcium: 9.7 mg/dL (ref 8.6–10.4)
PTH: 50 pg/mL (ref 16–77)

## 2021-11-24 LAB — VITAMIN D 25 HYDROXY (VIT D DEFICIENCY, FRACTURES): Vit D, 25-Hydroxy: 31 ng/mL (ref 30–100)

## 2021-11-24 LAB — T3, FREE: T3, Free: 3 pg/mL (ref 2.3–4.2)

## 2021-11-24 LAB — TSH: TSH: 0.25 mIU/L — ABNORMAL LOW (ref 0.40–4.50)

## 2021-11-28 ENCOUNTER — Telehealth (INDEPENDENT_AMBULATORY_CARE_PROVIDER_SITE_OTHER): Payer: Self-pay | Admitting: "Endocrinology

## 2021-11-28 NOTE — Telephone Encounter (Signed)
  Name of who is calling:[name] [name]   Caller's Relationship to Patient:Self   Best contact [phone]  Provider they see:Dr.Brennan   Reason for call:caller requested a call back with questions regarding getting a referral to adult Endo. [name] [name] also asked if a referrral could be sent to Dr. Maudie Mercury office at the [facility] in [place], [place]  Name of prescription:  Pharmacy:

## 2021-12-07 ENCOUNTER — Encounter (INDEPENDENT_AMBULATORY_CARE_PROVIDER_SITE_OTHER): Payer: Self-pay

## 2021-12-15 ENCOUNTER — Telehealth (INDEPENDENT_AMBULATORY_CARE_PROVIDER_SITE_OTHER): Payer: Self-pay

## 2021-12-15 NOTE — Telephone Encounter (Signed)
Spoke with patient. Gave results. Faxed to pcp

## 2021-12-15 NOTE — Telephone Encounter (Signed)
-----   Message from Sherrlyn Hock, MD sent at 12/06/2021 11:05 PM EDT ----- CMP was normal, except AST 50 (ref 10-35) and ALT 75 (ref 6-29). The liver is acting up again.  Cholesterols were elevated, but triglycerides were normal, but higher.  PTH and calcium were normal. Vitamin D was normal, but low-normal.  Thyroid tests were too high. Take one tablet per day for 6 days each week, but on the seventh days take only 1/2 tablet. Please have TSH, free T4 , and free T3 re-checked in 3 months.   Clinical staff: Please send this information to [name] [name]'s PCP. Thanks. Dr. Tobe Sos

## 2022-05-15 LAB — HEMOGLOBIN A1C: A1c: 6.4

## 2023-01-10 ENCOUNTER — Encounter: Payer: Medicare PPO | Attending: Family Medicine | Admitting: Nutrition

## 2023-01-10 VITALS — Ht 61.5 in | Wt 240.0 lb

## 2023-01-10 DIAGNOSIS — E7800 Pure hypercholesterolemia, unspecified: Secondary | ICD-10-CM | POA: Insufficient documentation

## 2023-01-10 DIAGNOSIS — R7303 Prediabetes: Secondary | ICD-10-CM | POA: Insufficient documentation

## 2023-01-10 NOTE — Progress Notes (Unsigned)
Medical Nutrition Therapy  Appointment Start time:  1300  Appointment End time:  1400  Primary concerns today: Hyperlipidemia, Pre DM  Referral diagnosis: E66.01, R73.03 Preferred learning style: No Preference  Learning readiness: Ready but hesitant to make some changes.  NUTRITION ASSESSMENT  62 yr old wfemale referred for obesity. BMI 44.  PCP Dr. Merleen Milliner. Pre Dm with A1C 6.2%, Fatty Live, Hyperlipidemia, Hypothyroidism, HTN and GERD.. Quit smoking and gained some weight afterwards. Use to go to [facility] pool for exercise before she broke her ankle. Wants to get back to going to the [facility] and eating better. She admits to a higher processed foods diet. She does can a lot of her foods from her garden. Enjoys being outside.  She is willing to work on Lifestyle Medicine Whole Plant dominant diet to help with losing weight, lowering A1C, reversing fatty liver and Hyperlipidemia and reducing cardiovascular disease. She is reluctant to give up red meat and some processed foods, but willing to work on it.  Clinical Medical Hx:  Past Medical History:  Diagnosis Date   Allergy to Coumadin    Not an allergy but pt declines Coumadin   Broken ankle 12/01/2020   pt states right ankle   Complication of anesthesia    difficulty breathing   Diverticulitis    August, 2013   Ejection fraction    EF normal by bedside echo per Dr. Andee Lineman, office visit, July, 2012   Fatigue    Galactorrhea syndrome    GERD (gastroesophageal reflux disease)    Hemiparesis, right (HCC)    History of kidney stones    Hypertension    Hypothyroidism, acquired, autoimmune    Kidney stones    Memory problem    Migraine headache    Versus TIAs   Patent foramen ovale    Status post device closure  at [facility]  25 mmGore Helix occluder device   Pre-diabetes    Stroke [facility])    Silent right brain stroke/cryptogenic stroke   Thyroid disease    Hypothyroidism, post-radiation   Thyroiditis, autoimmune     Tobacco abuse    Urinary calculus    Vertigo    Visual changes     Medications:  Current Outpatient Medications on File Prior to Visit  Medication Sig Dispense Refill   CALCIUM-VITAMIN D PO Take 1 tablet by mouth daily.     carvedilol (COREG) 12.5 MG tablet Take 12.5 mg by mouth 2 (two) times daily.       cetirizine (ZYRTEC) 10 MG tablet Take 10 mg by mouth daily.     CHOLECALCIFEROL PO Take 2 each by mouth daily. Liquid drops     clopidogrel (PLAVIX) 75 MG tablet Take 75 mg by mouth daily.     Doxycycline Hyclate 200 MG TBEC Take on daily. 10 tablet 1   ezetimibe (ZETIA) 10 MG tablet Take 1 tablet by mouth once daily 30 tablet 2   HYDROcodone-acetaminophen (NORCO/VICODIN) 5-325 MG tablet Take 1 tablet by mouth every 6 (six) hours as needed for moderate pain. (Patient not taking: Reported on 07/21/2021) 8 tablet 0   levothyroxine (SYNTHROID) 125 MCG tablet Take 1 tablet by mouth once daily 90 tablet 3   LUTEIN PO Take 1 tablet by mouth daily.      methocarbamol (ROBAXIN) 500 MG tablet Take 500 mg by mouth every 8 (eight) hours as needed for muscle spasms.     Multiple Vitamin (MULTI-VITAMINS) TABS Take 1 tablet by mouth daily.  oxybutynin (DITROPAN) 5 MG tablet 1 tab tid prn frequency,urgency, bladder spasm 20 tablet 0   RABEprazole (ACIPHEX) 20 MG tablet Take 1 tablet by mouth once daily 30 tablet 4   tamsulosin (FLOMAX) 0.4 MG CAPS capsule Take 1 capsule (0.4 mg total) by mouth daily after breakfast. 7 capsule 0   No current facility-administered medications on file prior to visit.    Labs:  Lab Results  Component Value Date   HGBA1C 6.2 (A) 11/23/2021      Latest Ref Rng & Units 11/23/2021    2:27 PM 01/05/2020   10:07 AM 10/24/2019   10:06 PM  CMP  Glucose 65 - 139 mg/dL 161  96  096   BUN 7 - 25 mg/dL 16  16  24    Creatinine 0.50 - 1.05 mg/dL 0.45  4.09  8.11   Sodium 135 - 146 mmol/L 140  142  139   Potassium 3.5 - 5.3 mmol/L 4.4  4.8  4.7   Chloride 98 - 110 mmol/L  101  105  101   CO2 20 - 32 mmol/L 28  29  27    Calcium 8.6 - 10.4 mg/dL 8.6 - 91.4 mg/dL 9.7    9.7  78.2    95.6  9.6   Total Protein 6.1 - 8.1 g/dL 7.1  6.9    Total Bilirubin 0.2 - 1.2 mg/dL 0.4  0.6    AST 10 - 35 U/L 50  32    ALT 6 - 29 U/L 75  54     Lipid Panel     Component Value Date/Time   CHOL 244 (H) 11/23/2021 1427   CHOL 237 (H) 07/26/2021 1030   TRIG 121 11/23/2021 1427   HDL 76 11/23/2021 1427   HDL 77 07/26/2021 1030   CHOLHDL 3.2 11/23/2021 1427   VLDL 20 08/01/2016 1422   LDLCALC 144 (H) 11/23/2021 1427   LABVLDL 11 07/26/2021 1030    Notable Signs/Symptoms: Fatigue  Lifestyle & Dietary Hx Lives by herself. Eats 2-3 meals per day; eats a 'brunch" and dinner. Stays up late and sleeps in.  Estimated daily fluid intake: 4730 oz Supplements:  Sleep: poor Stress / self-care:  Current average weekly physical activity: ADL  24-Hr Dietary Recall Eats 1-2 meals per day Snacks. Likes ice cream  Estimated Energy Needs Calories: 1200 Carbohydrate: 135g Protein: 90g Fat: 33g   NUTRITION DIAGNOSIS  NI-1.7 Predicted excessive energy intake As related to high calorie diet.  As evidenced by BMI 44 and eating meals inconsistently.   NUTRITION INTERVENTION  Nutrition education (E-1) on the following topics:  Nutrition and  Pre-Diabetes education provided on My Plate, CHO counting, meal planning, portion sizes, timing of meals, avoiding snacks between meals unless having a low blood sugar, target ranges for A1C and blood sugars, signs/symptoms and treatment of hyper/hypoglycemia, monitoring blood sugars, taking medications as prescribed, benefits of exercising 30 minutes per day and prevention of complications of DM. Lifestyle Medicine  - Whole Food, Plant Predominant Nutrition is highly recommended: Eat Plenty of vegetables, Mushrooms, fruits, Legumes, Whole Grains, Nuts, seeds in lieu of processed meats, processed snacks/pastries red meat, poultry, eggs.     -It is better to avoid simple carbohydrates including: Cakes, Sweet Desserts, Ice Cream, Soda (diet and regular), Sweet Tea, Candies, Chips, Cookies, Store Bought Juices, Alcohol in Excess of  1-2 drinks a day, Lemonade,  Artificial Sweeteners, Doughnuts, Coffee Creamers, "Sugar-free" Products, etc, etc.  This is not a complete list.....  Exercise:  If you are able: 30 -60 minutes a day ,4 days a week, or 150 minutes a week.  The longer the better.  Combine stretch, strength, and aerobic activities.  If you were told in the past that you have high risk for cardiovascular diseases, you may seek evaluation by your heart doctor prior to initiating moderate to intense exercise programs.   Handouts Provided Include  Lifestyle Medicine Handouts  Learning Style & Readiness for Change Teaching method utilized: Visual & Auditory  Demonstrated degree of understanding via: Teach Back  Barriers to learning/adherence to lifestyle change: Non3  Goals Established by Pt Eat thee balanced meals per day Drink 5 bottles of water per day Increase whole plant based foods from your garden for meals and cut out red meat, pork and processed foods Walk 30 minutes a day or do chair exercises when able. Lose 1 lb per week May need to re evaluate your A1C since it was a year ago.   MONITORING & EVALUATION Dietary intake, weekly physical activity, and weight in 1 month. .  Next Steps  Patient is to work on meal planning and timing of meals.Marland Kitchen

## 2023-01-11 ENCOUNTER — Encounter: Payer: Self-pay | Admitting: Nutrition

## 2023-01-11 NOTE — Patient Instructions (Signed)
Eat thee balanced meals per day Drink 5 bottles of water per day Increase whole plant based foods from your garden for meals and cut out red meat, pork and processed foods Walk 30 minutes a day or do chair exercises when able. Lose 1 lb per week May need to re evaluate your A1C since it was a year ag

## 2023-01-25 ENCOUNTER — Encounter: Payer: Self-pay | Admitting: Gastroenterology

## 2023-02-15 ENCOUNTER — Ambulatory Visit: Payer: Medicare PPO | Admitting: Registered Nurse

## 2023-02-15 ENCOUNTER — Other Ambulatory Visit: Payer: Self-pay

## 2023-02-15 ENCOUNTER — Encounter: Payer: Self-pay | Admitting: Gastroenterology

## 2023-02-15 ENCOUNTER — Encounter
Admission: RE | Disposition: A | Discharge status: Home / Self Care | Payer: Self-pay | Source: Home / Self Care | Attending: Gastroenterology

## 2023-02-15 ENCOUNTER — Ambulatory Visit
Admission: RE | Admit: 2023-02-15 | Discharge: 2023-02-15 | Disposition: A | Discharge status: Home / Self Care | Payer: Medicare PPO | Attending: Gastroenterology | Admitting: Gastroenterology

## 2023-02-15 DIAGNOSIS — R7303 Prediabetes: Secondary | ICD-10-CM | POA: Insufficient documentation

## 2023-02-15 DIAGNOSIS — I69351 Hemiplegia and hemiparesis following cerebral infarction affecting right dominant side: Secondary | ICD-10-CM | POA: Diagnosis not present

## 2023-02-15 DIAGNOSIS — Z1211 Encounter for screening for malignant neoplasm of colon: Secondary | ICD-10-CM | POA: Diagnosis present

## 2023-02-15 DIAGNOSIS — K649 Unspecified hemorrhoids: Secondary | ICD-10-CM | POA: Insufficient documentation

## 2023-02-15 DIAGNOSIS — Z860101 Personal history of adenomatous and serrated colon polyps: Secondary | ICD-10-CM | POA: Diagnosis not present

## 2023-02-15 DIAGNOSIS — K5730 Diverticulosis of large intestine without perforation or abscess without bleeding: Secondary | ICD-10-CM | POA: Insufficient documentation

## 2023-02-15 DIAGNOSIS — I10 Essential (primary) hypertension: Secondary | ICD-10-CM | POA: Insufficient documentation

## 2023-02-15 HISTORY — PX: BIOPSY: SHX5522

## 2023-02-15 HISTORY — PX: COLONOSCOPY WITH PROPOFOL: SHX5780

## 2023-02-15 SURGERY — COLONOSCOPY WITH PROPOFOL
Anesthesia: General

## 2023-02-15 MED ORDER — LIDOCAINE HCL (PF) 2 % IJ SOLN
INTRAMUSCULAR | Status: AC
Start: 2023-02-15 — End: ?
  Filled 2023-02-15: qty 5

## 2023-02-15 MED ORDER — PROPOFOL 10 MG/ML IV BOLUS
INTRAVENOUS | Status: DC | PRN
  Administered 2023-02-15: 30 mg via INTRAVENOUS
  Administered 2023-02-15: 80 mg via INTRAVENOUS

## 2023-02-15 MED ORDER — PROPOFOL 1000 MG/100ML IV EMUL
INTRAVENOUS | Status: AC
  Filled 2023-02-15: qty 100

## 2023-02-15 MED ORDER — PROPOFOL 500 MG/50ML IV EMUL
INTRAVENOUS | Status: DC | PRN
  Administered 2023-02-15: 125 ug/kg/min via INTRAVENOUS

## 2023-02-15 MED ORDER — LIDOCAINE HCL (CARDIAC) PF 100 MG/5ML IV SOSY
PREFILLED_SYRINGE | INTRAVENOUS | Status: DC | PRN
  Administered 2023-02-15: 20 mg via INTRAVENOUS

## 2023-02-15 MED ORDER — EPHEDRINE SULFATE-NACL 50-0.9 MG/10ML-% IV SOSY
PREFILLED_SYRINGE | INTRAVENOUS | Status: DC | PRN
  Administered 2023-02-15 (×2): 5 mg via INTRAVENOUS

## 2023-02-15 MED ORDER — SODIUM CHLORIDE 0.9 % IV SOLN: INTRAVENOUS | Status: DC

## 2023-02-15 NOTE — Op Note (Signed)
[facility] Gastroenterology Patient Name: [name] [name] Procedure Date: 02/15/2023 7:19 AM MRN: [id number] Account #: [id number] Date of Birth: 1960/07/07 Admit Type: Outpatient Age: 62 Room: [facility] ENDO ROOM 1 Gender: Female Note Status: Finalized Instrument Name: Prentice Docker [id number] Procedure:             Colonoscopy Indications:           High risk colon cancer surveillance: Personal history                         of sessile serrated colon polyp (10 mm or greater in                         size) Providers:             Trenda Moots, DO Referring MD:          Lillia Pauls. Merleen Milliner MD, MD (Referring MD) Medicines:             Monitored Anesthesia Care Complications:         No immediate complications. Estimated blood loss:                         Minimal. Procedure:             Pre-Anesthesia Assessment:                        - Prior to the procedure, a History and Physical was                         performed, and patient medications and allergies were                         reviewed. The patient is competent. The risks and                         benefits of the procedure and the sedation options and                         risks were discussed with the patient. All questions                         were answered and informed consent was obtained.                         Patient identification and proposed procedure were                         verified by the physician, the nurse, the anesthetist                         and the technician in the endoscopy suite. Mental                         Status Examination: alert and oriented. Airway                         Examination: normal oropharyngeal airway and neck  mobility. Respiratory Examination: clear to                         auscultation. CV Examination: RRR, no murmurs, no S3                         or S4. Prophylactic Antibiotics: The patient does not                          require prophylactic antibiotics. Prior                         Anticoagulants: The patient has taken no anticoagulant                         or antiplatelet agents. ASA Grade Assessment: III - A                         patient with severe systemic disease. After reviewing                         the risks and benefits, the patient was deemed in                         satisfactory condition to undergo the procedure. The                         anesthesia plan was to use monitored anesthesia care                         (MAC). Immediately prior to administration of                         medications, the patient was re-assessed for adequacy                         to receive sedatives. The heart rate, respiratory                         rate, oxygen saturations, blood pressure, adequacy of                         pulmonary ventilation, and response to care were                         monitored throughout the procedure. The physical                         status of the patient was re-assessed after the                         procedure.                        After obtaining informed consent, the colonoscope was                         passed under direct vision. Throughout the procedure,  the patient's blood pressure, pulse, and oxygen                         saturations were monitored continuously. The                         Colonoscope was introduced through the anus and                         advanced to the the cecum, identified by appendiceal                         orifice and ileocecal valve. The colonoscopy was                         performed without difficulty. The patient tolerated                         the procedure well. The quality of the bowel                         preparation was evaluated using the BBPS [facility] Bowel                         Preparation Scale) with scores of: Right Colon = 2                         (minor amount of  residual staining, small fragments of                         stool and/or opaque liquid, but mucosa seen well),                         Transverse Colon = 2 (minor amount of residual                         staining, small fragments of stool and/or opaque                         liquid, but mucosa seen well) and Left Colon = 3                         (entire mucosa seen well with no residual staining,                         small fragments of stool or opaque liquid). The total                         BBPS score equals 7. The quality of the bowel                         preparation was good. The ileocecal valve, appendiceal                         orifice, and rectum were photographed. Findings:      Hemorrhoids were found on perianal exam.      The digital rectal exam was normal.  Pertinent negatives include normal       sphincter tone.      Multiple small-mouthed diverticula were found in the left colon.       Estimated blood loss: none.      Normal mucosa was found in the entire colon. Biopsies for histology were       taken with a cold forceps from the right colon and left colon for       evaluation of microscopic colitis. Estimated blood loss was minimal.      The exam was otherwise without abnormality on direct and retroflexion       views. Impression:            - Hemorrhoids found on perianal exam.                        - Diverticulosis in the left colon.                        - Normal mucosa in the entire examined colon. Biopsied.                        - The examination was otherwise normal on direct and                         retroflexion views. Recommendation:        - Patient has a contact number available for                         emergencies. The signs and symptoms of potential                         delayed complications were discussed with the patient.                         Return to normal activities tomorrow. Written                         discharge  instructions were provided to the patient.                        - Discharge patient to home.                        - Resume previous diet.                        - Continue present medications.                        - Await pathology results.                        - Repeat colonoscopy in 5 years for surveillance.                        - Return to referring physician as previously                         scheduled.                        -  The findings and recommendations were discussed with                         the patient. Procedure Code(s):     --- Professional ---                        [phone], Colonoscopy, flexible; with biopsy, single or                         multiple Diagnosis Code(s):     --- Professional ---                        Z86.010, Personal history of colonic polyps                        K64.9, Unspecified hemorrhoids                        K57.30, Diverticulosis of large intestine without                         perforation or abscess without bleeding CPT copyright 2022 American Medical Association. All rights reserved. The codes documented in this report are preliminary and upon coder review may  be revised to meet current compliance requirements. Attending Participation:      I personally performed the entire procedure. Elfredia Nevins, DO Jaynie Lieser DO, DO 02/15/2023 8:12:05 AM This report has been signed electronically. Number of Addenda: 0 Note Initiated On: 02/15/2023 7:19 AM Scope Withdrawal Time: 0 hours 11 minutes 46 seconds  Total Procedure Duration: 0 hours 15 minutes 11 seconds  Estimated Blood Loss:  Estimated blood loss was minimal.      [facility]

## 2023-02-15 NOTE — Anesthesia Preprocedure Evaluation (Signed)
Anesthesia Evaluation  Patient identified by MRN, date of birth, ID band Patient awake    Reviewed: Allergy & Precautions, H&P , NPO status , Patient's Chart, lab work & pertinent test results  History of Anesthesia Complications (+) DIFFICULT AIRWAY and history of anesthetic complications  Airway Mallampati: III  TM Distance: <3 FB Neck ROM: limited    Dental  (+) Chipped, Poor Dentition, Dental Advidsory Given   Pulmonary neg shortness of breath, sleep apnea , COPD, neg recent URI, former smoker   Pulmonary exam normal        Cardiovascular Exercise Tolerance: Good hypertension, (-) angina (-) Past MI, (-) Cardiac Stents and (-) DOE Normal cardiovascular exam(-) dysrhythmias + Valvular Problems/Murmurs (PFO, but was repaired)      Neuro/Psych  Headaches, neg Seizures TIACVA (right side), Residual Symptoms    GI/Hepatic ,GERD  ,,(+) Cirrhosis         Endo/Other  diabetesHypothyroidism  Class 4 obesity  Renal/GU Renal disease     Musculoskeletal   Abdominal   Peds  Hematology negative hematology ROS (+)   Anesthesia Other Findings Past Medical History: No date: Allergy to Coumadin     Comment:  Not an allergy but pt declines Coumadin No date: Complication of anesthesia     Comment:  difficulty breathing No date: Diverticulitis     Comment:  August, 2013 No date: Ejection fraction     Comment:  EF normal by bedside echo per Dr. 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, acquired, autoimmune No date: Kidney stones No date: Memory problem No date: Migraine headache     Comment:  Versus TIAs No date: Patent foramen ovale     Comment:  Status post device closure  at [facility]  25                mmGore Helix occluder device No date: Pre-diabetes No date: Stroke [facility])     Comment:  Silent right brain stroke/cryptogenic stroke No date: Thyroid disease     Comment:  Hypothyroidism, post-radiation No date: Thyroiditis, autoimmune No date: Tobacco abuse No date: Urinary calculus No date: Vertigo No date: Visual changes  Past Surgical History: 02/2019: APPENDECTOMY No date: BACK SURGERY     Comment:  X2 RODS IN BACK DURING SECOND SURGERY 01/01/2017: COLONOSCOPY WITH PROPOFOL; N/A     Comment:  Procedure: COLONOSCOPY WITH PROPOFOL;  Surgeon: [name], MD;  Location: [facility] ENDOSCOPY;  Service:               Endoscopy;  Laterality: N/A; 01/01/2017: ESOPHAGOGASTRODUODENOSCOPY (EGD) WITH PROPOFOL; N/A     Comment:  Procedure: ESOPHAGOGASTRODUODENOSCOPY (EGD) WITH               PROPOFOL;  Surgeon: [name], MD;  Location:               [facility] ENDOSCOPY;  Service: Endoscopy;  Laterality: N/A; 05/25/2006: I-131 TREATMENT No date: PATENT FORAMEN OVALE(PFO) CLOSURE No date: polyp removal No date: TONSILLECTOMY No date: TONSILLECTOMY AND ADENOIDECTOMY 1997: TOTAL ABDOMINAL HYSTERECTOMY     Comment:  No bso  BMI    Body Mass Index: 37.49 kg/m  Reproductive/Obstetrics negative OB ROS                             Anesthesia Physical Anesthesia Plan  ASA: 3  Anesthesia Plan: General   Post-op Pain Management:    Induction: Intravenous  PONV Risk Score and Plan: 3 and Propofol infusion and TIVA  Airway Management Planned: Natural Airway and Nasal Cannula  Additional Equipment:   Intra-op Plan:   Post-operative Plan:   Informed Consent: I have reviewed the patients History and Physical, chart, labs and discussed the procedure including the risks, benefits and alternatives for the proposed anesthesia with the patient or authorized representative who has indicated his/her understanding and acceptance.      Dental Advisory Given  Plan Discussed with: Anesthesiologist, CRNA and Surgeon  Anesthesia Plan Comments: (Patient consented for risks of anesthesia including but not limited to:  - adverse reactions to medications - damage to eyes, teeth, lips or other oral mucosa - nerve damage due to positioning  - sore throat or hoarseness - Damage to heart, brain, nerves, lungs, other parts of body or loss of life  Patient voiced understanding.)        Anesthesia Quick Evaluation

## 2023-02-15 NOTE — Interval H&P Note (Signed)
History and Physical Interval Note: Preprocedure H&P from 02/15/23  was reviewed and there was no interval change after seeing and examining the patient.  Written consent was obtained from the patient after discussion of risks, benefits, and alternatives. Patient has consented to proceed with Colonoscopy with possible intervention   02/15/2023 7:44 AM  [name] [name]  has presented today for surgery, with the diagnosis of V12.72 (ICD-9-CM) - Z86.010 (ICD-10-CM) - Personal history of colonic polyps.  The various methods of treatment have been discussed with the patient and family. After consideration of risks, benefits and other options for treatment, the patient has consented to  Procedure(s): COLONOSCOPY WITH PROPOFOL (N/A) as a surgical intervention.  The patient's history has been reviewed, patient examined, no change in status, stable for surgery.  I have reviewed the patient's chart and labs.  Questions were answered to the patient's satisfaction.     Jaynie Lenhard

## 2023-02-15 NOTE — Transfer of Care (Signed)
Immediate Anesthesia Transfer of Care Note  Patient: [name] [name]  Procedure(s) Performed: COLONOSCOPY WITH PROPOFOL  Patient Location: PACU  Anesthesia Type:General  Level of Consciousness: drowsy  Airway & Oxygen Therapy: Patient Spontanous Breathing and Patient connected to face mask oxygen  Post-op Assessment: Report given to RN and Post -op Vital signs reviewed and stable  Post vital signs: Reviewed and stable  Last Vitals:  Vitals Value Taken Time  BP 111/50 02/15/23  Temp 97 02/15/23  Pulse 80 02/15/23 0810  Resp 16 02/15/23 0810  SpO2 95 % 02/15/23 0810  Vitals shown include unfiled device data.  Last Pain:  Vitals:   02/15/23 0712  TempSrc: Temporal  PainSc: 0-No pain         Complications: No notable events documented.

## 2023-02-15 NOTE — H&P (Signed)
Pre-Procedure H&P   Patient ID: [name] [name] is a 62 y.o. female.  Gastroenterology Provider: Jaynie Hataway, DO  Referring Provider: Dr. Merleen Milliner PCP: [name], MD  Date: 02/15/2023  HPI [name] [name] is a 62 y.o. female who presents today for Colonoscopy for Personal history of advanced colon polyps .  Patient with a history of advanced polyps she had an 10 mm sessile serrated polyp removed in 2015 and left-sided diverticulosis.  Repeat in 2018 demonstrated sigmoid diverticulosis and hemorrhoids.  She has persistently battled chronic diarrhea.  Has 3-4 loose bowel movements per day without melena or hematochezia.  Weight and appetite have been stable.  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 Has metal rods in the back. Last dose of Plavix 12/1   Past Medical History:  Diagnosis Date   Allergy to Coumadin    Not an allergy but pt declines Coumadin   Broken ankle 12/01/2020   pt states right ankle   Complication of anesthesia    difficulty breathing   Diverticulitis    August, 2013   Ejection fraction    EF normal by bedside echo per Dr. Andee Lineman, office visit, July, 2012   Fatigue    Galactorrhea syndrome    GERD (gastroesophageal reflux disease)    Hemiparesis, right (HCC)    History of kidney stones    Hypertension    Hypothyroidism, acquired, autoimmune    Kidney stones    Memory problem    Migraine headache    Versus TIAs   Patent foramen ovale    Status post device closure  at [facility]  25 mmGore Helix occluder device   Pre-diabetes    Stroke [facility])    Silent right brain stroke/cryptogenic stroke   Thyroid disease    Hypothyroidism, post-radiation   Thyroiditis, autoimmune    Tobacco abuse    Urinary calculus    Vertigo    Visual changes     Past Surgical History:  Procedure Laterality Date   APPENDECTOMY  02/2019    BACK SURGERY     X2 RODS IN BACK DURING SECOND SURGERY   COLONOSCOPY WITH PROPOFOL N/A 01/01/2017   Procedure: COLONOSCOPY WITH PROPOFOL;  Surgeon: [name], MD;  Location: [facility] ENDOSCOPY;  Service: Endoscopy;  Laterality: N/A;   CYSTOSCOPY W/ RETROGRADES Left 10/21/2019   Procedure: CYSTOSCOPY WITH RETROGRADE PYELOGRAM;  Surgeon: [name], MD;  Location: [facility] ORS;  Service: Urology;  Laterality: Left;   CYSTOSCOPY/URETEROSCOPY/HOLMIUM LASER/STENT PLACEMENT Left 10/21/2019   Procedure: CYSTOSCOPY/URETEROSCOPY/HOLMIUM LASER/STENT PLACEMENT;  Surgeon: [name], MD;  Location: [facility] ORS;  Service: Urology;  Laterality: Left;   ESOPHAGOGASTRODUODENOSCOPY (EGD) WITH PROPOFOL N/A 01/01/2017   Procedure: ESOPHAGOGASTRODUODENOSCOPY (EGD) WITH PROPOFOL;  Surgeon: [name], MD;  Location: [facility] ENDOSCOPY;  Service: Endoscopy;  Laterality: N/A;   I-131 TREATMENT  05/25/2006   PATENT FORAMEN OVALE(PFO) CLOSURE     polyp removal     TONSILLECTOMY     TONSILLECTOMY AND ADENOIDECTOMY     TOTAL ABDOMINAL HYSTERECTOMY  1997   No bso    Family History No h/o GI disease or malignancy  Review of Systems  Constitutional:  Negative for activity change, appetite change, chills, diaphoresis, fatigue, fever and unexpected weight change.  HENT:  Negative for trouble swallowing and voice change.   Respiratory:  Negative for shortness of breath and wheezing.   Cardiovascular:  Negative for chest pain,  palpitations and leg swelling.  Gastrointestinal:  Positive for diarrhea. Negative for abdominal distention, abdominal pain, anal bleeding, blood in stool, constipation, nausea, rectal pain and vomiting.  Musculoskeletal:  Negative for arthralgias and myalgias.  Skin:  Negative for color change and pallor.  Neurological:  Negative for dizziness, syncope and weakness.  Psychiatric/Behavioral:  Negative for confusion.   All other systems reviewed and are negative.    Medications No  current facility-administered medications on file prior to encounter.   Current Outpatient Medications on File Prior to Encounter  Medication Sig Dispense Refill   carvedilol (COREG) 12.5 MG tablet Take 12.5 mg by mouth 2 (two) times daily.       levothyroxine (SYNTHROID) 125 MCG tablet Take 1 tablet by mouth once daily 90 tablet 3   LUTEIN PO Take 1 tablet by mouth daily.      CALCIUM-VITAMIN D PO Take 1 tablet by mouth daily.     cetirizine (ZYRTEC) 10 MG tablet Take 10 mg by mouth daily.     CHOLECALCIFEROL PO Take 2 each by mouth daily. Liquid drops     clopidogrel (PLAVIX) 75 MG tablet Take 75 mg by mouth daily.     Doxycycline Hyclate 200 MG TBEC Take on daily. 10 tablet 1   ezetimibe (ZETIA) 10 MG tablet Take 1 tablet by mouth once daily 30 tablet 2   HYDROcodone-acetaminophen (NORCO/VICODIN) 5-325 MG tablet Take 1 tablet by mouth every 6 (six) hours as needed for moderate pain. (Patient not taking: Reported on 07/21/2021) 8 tablet 0   methocarbamol (ROBAXIN) 500 MG tablet Take 500 mg by mouth every 8 (eight) hours as needed for muscle spasms.     Multiple Vitamin (MULTI-VITAMINS) TABS Take 1 tablet by mouth daily.      oxybutynin (DITROPAN) 5 MG tablet 1 tab tid prn frequency,urgency, bladder spasm 20 tablet 0   RABEprazole (ACIPHEX) 20 MG tablet Take 1 tablet by mouth once daily 30 tablet 4   tamsulosin (FLOMAX) 0.4 MG CAPS capsule Take 1 capsule (0.4 mg total) by mouth daily after breakfast. 7 capsule 0    Pertinent medications related to GI and procedure were reviewed by me with the patient prior to the procedure   Current Facility-Administered Medications:    0.9 %  sodium chloride infusion, , Intravenous, Continuous, Jaynie Heagle, DO, Last Rate: 20 mL/hr at 02/15/23 0729, New Bag at 02/15/23 0729  sodium chloride 20 mL/hr at 02/15/23 1610       Allergies  Allergen Reactions   Crestor [Rosuvastatin Calcium] Other (See Comments)    Muscle cramps.   Cymbalta  [Duloxetine Hcl] Diarrhea   Doxycycline     REACTION: Facial numbness, Nausea   Metronidazole     REACTION: "blood vessels come up to top of skin"   Antivenin, Paralysis Tick Rash    Tick bite- rash   Allergies were reviewed by me prior to the procedure  Objective   Body mass index is 44.02 kg/m. Vitals:   02/15/23 0712  BP: 117/67  Pulse: 72  Resp: 16  Temp: (!) 96.9 F (36.1 C)  TempSrc: Temporal  SpO2: 94%  Weight: 105.7 kg  Height: 5\' 1"  (1.549 m)     Physical Exam Vitals and nursing note reviewed.  Constitutional:      General: She is not in acute distress.    Appearance: She is obese. She is not ill-appearing, toxic-appearing or diaphoretic.  HENT:     Head: Normocephalic and atraumatic.  Nose: Nose normal.     Mouth/Throat:     Mouth: Mucous membranes are moist.     Pharynx: Oropharynx is clear.  Eyes:     General: No scleral icterus.    Extraocular Movements: Extraocular movements intact.  Cardiovascular:     Rate and Rhythm: Normal rate and regular rhythm.     Heart sounds: Normal heart sounds. No murmur heard.    No friction rub. No gallop.  Pulmonary:     Effort: Pulmonary effort is normal. No respiratory distress.     Breath sounds: Normal breath sounds. No wheezing, rhonchi or rales.  Abdominal:     General: Bowel sounds are normal. There is no distension.     Palpations: Abdomen is soft.     Tenderness: There is no abdominal tenderness. There is no guarding or rebound.  Musculoskeletal:     Cervical back: Neck supple.     Right lower leg: No edema.     Left lower leg: No edema.  Skin:    General: Skin is warm and dry.     Coloration: Skin is not jaundiced or pale.  Neurological:     General: No focal deficit present.     Mental Status: She is alert and oriented to person, place, and time. Mental status is at baseline.  Psychiatric:        Mood and Affect: Mood normal.        Behavior: Behavior normal.        Thought Content: Thought  content normal.        Judgment: Judgment normal.      Assessment:  [name] [name] is a 62 y.o. female  who presents today for Colonoscopy for Personal history of advanced colon polyps .  Plan:  Colonoscopy with possible intervention today  Colonoscopy with possible biopsy, control of bleeding, polypectomy, and interventions as necessary has been discussed with the patient/patient representative. Informed consent was obtained from the patient/patient representative after explaining the indication, nature, and risks of the procedure including but not limited to death, bleeding, perforation, missed neoplasm/lesions, cardiorespiratory compromise, and reaction to medications. Opportunity for questions was given and appropriate answers were provided. Patient/patient representative has verbalized understanding is amenable to undergoing the procedure.   Jaynie Orne, DO  [facility] Gastroenterology  Portions of the record may have been created with voice recognition software. Occasional wrong-word or 'sound-a-like' substitutions may have occurred due to the inherent limitations of voice recognition software.  Read the chart carefully and recognize, using context, where substitutions may have occurred.

## 2023-02-16 ENCOUNTER — Encounter: Payer: Self-pay | Admitting: Gastroenterology

## 2023-02-16 LAB — SURGICAL PATHOLOGY

## 2023-02-20 ENCOUNTER — Ambulatory Visit: Payer: Medicare PPO | Admitting: Nutrition

## 2023-02-22 NOTE — Anesthesia Postprocedure Evaluation (Signed)
Anesthesia Post Note  Patient: [name] [name]  Procedure(s) Performed: COLONOSCOPY WITH PROPOFOL BIOPSY  Patient location during evaluation: Endoscopy Anesthesia Type: General Level of consciousness: awake and alert Pain management: pain level controlled Vital Signs Assessment: post-procedure vital signs reviewed and stable Respiratory status: spontaneous breathing, nonlabored ventilation, respiratory function stable and patient connected to nasal cannula oxygen Cardiovascular status: blood pressure returned to baseline and stable Postop Assessment: no apparent nausea or vomiting Anesthetic complications: no   No notable events documented.   Last Vitals:  Vitals:   02/15/23 0822 02/15/23 0830  BP: 94/62 (!) 93/53  Pulse: 78 66  Resp: 16 13  Temp:    SpO2: 95% 96%    Last Pain:  Vitals:   02/16/23 0831  TempSrc:   PainSc: 0-No pain                 Lenard Simmer

## 2023-04-04 ENCOUNTER — Ambulatory Visit: Payer: Medicare PPO | Admitting: Nutrition

## 2023-04-11 ENCOUNTER — Ambulatory Visit: Payer: Medicare PPO | Admitting: Nutrition
